# Patient Record
Sex: Female | Born: 1965 | Race: Black or African American | Hispanic: No | Marital: Married | Smoking: Never smoker
Health system: Southern US, Community
[De-identification: ages and names within clinical notes are randomized; demographics above are authoritative.]

## PROBLEM LIST (undated history)

## (undated) DIAGNOSIS — J449 Chronic obstructive pulmonary disease, unspecified: Secondary | ICD-10-CM

## (undated) DIAGNOSIS — J45909 Unspecified asthma, uncomplicated: Secondary | ICD-10-CM

## (undated) HISTORY — DX: Unspecified asthma, uncomplicated: J45.909

## (undated) HISTORY — PX: TUBAL LIGATION: SHX77

## (undated) HISTORY — PX: ABDOMINAL HYSTERECTOMY: SHX81

---

## 1997-09-05 ENCOUNTER — Inpatient Hospital Stay (HOSPITAL_COMMUNITY): Admission: AD | Admit: 1997-09-05 | Discharge: 1997-09-07 | Payer: Self-pay | Admitting: Obstetrics and Gynecology

## 1997-10-20 ENCOUNTER — Other Ambulatory Visit: Admission: RE | Admit: 1997-10-20 | Discharge: 1997-10-20 | Payer: Self-pay | Admitting: Obstetrics and Gynecology

## 1998-10-18 ENCOUNTER — Other Ambulatory Visit: Admission: RE | Admit: 1998-10-18 | Discharge: 1998-10-18 | Payer: Self-pay | Admitting: Obstetrics and Gynecology

## 1999-08-06 ENCOUNTER — Encounter (INDEPENDENT_AMBULATORY_CARE_PROVIDER_SITE_OTHER): Payer: Self-pay | Admitting: Specialist

## 1999-08-06 ENCOUNTER — Inpatient Hospital Stay (HOSPITAL_COMMUNITY): Admission: AD | Admit: 1999-08-06 | Discharge: 1999-08-08 | Payer: Self-pay | Admitting: Obstetrics and Gynecology

## 1999-09-08 ENCOUNTER — Other Ambulatory Visit: Admission: RE | Admit: 1999-09-08 | Discharge: 1999-09-08 | Payer: Self-pay | Admitting: Obstetrics and Gynecology

## 1999-09-16 ENCOUNTER — Ambulatory Visit (HOSPITAL_COMMUNITY): Admission: RE | Admit: 1999-09-16 | Discharge: 1999-09-16 | Payer: Self-pay | Admitting: Obstetrics and Gynecology

## 2000-11-06 ENCOUNTER — Other Ambulatory Visit: Admission: RE | Admit: 2000-11-06 | Discharge: 2000-11-06 | Payer: Self-pay | Admitting: Obstetrics and Gynecology

## 2001-12-31 ENCOUNTER — Other Ambulatory Visit: Admission: RE | Admit: 2001-12-31 | Discharge: 2001-12-31 | Payer: Self-pay | Admitting: Obstetrics and Gynecology

## 2002-02-14 ENCOUNTER — Ambulatory Visit (HOSPITAL_COMMUNITY): Admission: RE | Admit: 2002-02-14 | Discharge: 2002-02-14 | Payer: Self-pay | Admitting: Obstetrics and Gynecology

## 2002-02-14 ENCOUNTER — Encounter (INDEPENDENT_AMBULATORY_CARE_PROVIDER_SITE_OTHER): Payer: Self-pay | Admitting: *Deleted

## 2003-04-16 ENCOUNTER — Other Ambulatory Visit: Admission: RE | Admit: 2003-04-16 | Discharge: 2003-04-16 | Payer: Self-pay | Admitting: Obstetrics and Gynecology

## 2007-02-11 ENCOUNTER — Encounter (INDEPENDENT_AMBULATORY_CARE_PROVIDER_SITE_OTHER): Payer: Self-pay | Admitting: Obstetrics and Gynecology

## 2007-02-11 ENCOUNTER — Ambulatory Visit (HOSPITAL_COMMUNITY): Admission: RE | Admit: 2007-02-11 | Discharge: 2007-02-11 | Payer: Self-pay | Admitting: Obstetrics and Gynecology

## 2007-05-09 HISTORY — PX: DILATION AND CURETTAGE OF UTERUS: SHX78

## 2007-08-28 ENCOUNTER — Ambulatory Visit: Payer: Self-pay | Admitting: Family Medicine

## 2010-06-30 ENCOUNTER — Ambulatory Visit (HOSPITAL_COMMUNITY): Admission: RE | Admit: 2010-06-30 | Payer: 59 | Source: Ambulatory Visit | Admitting: Obstetrics and Gynecology

## 2010-06-30 ENCOUNTER — Ambulatory Visit: Admit: 2010-06-30 | Payer: Self-pay | Admitting: Obstetrics and Gynecology

## 2010-09-20 NOTE — Op Note (Signed)
NAME:  Jocelyn Myers, Jocelyn Myers                 ACCOUNT NO.:  [id number]   MEDICAL RECORD NO.:  [id number]          PATIENT TYPE:  AMB   LOCATION:  SDC                           FACILITY:  [facility]   PHYSICIAN:  Maxie Better, M.D.DATE OF BIRTH:  1965/09/21   DATE OF PROCEDURE:  02/11/2007  DATE OF DISCHARGE:                               OPERATIVE REPORT   PREOPERATIVE DIAGNOSIS:  Menorrhagia, endometrial mass.   PROCEDURES:  Diagnostic hysteroscopy, resection of endometrial polyps,  dilation and curettage.   POSTOPERATIVE DIAGNOSIS:  Menorrhagia, endometrial polyps.   ANESTHESIA:  General, paracervical block.   SURGEON:  Maxie Better, M.D.   PROCEDURE:  Under adequate general anesthesia, the patient is placed in  the dorsal lithotomy position.  She was sterilely prepped and draped in  usual fashion.  The bladder was catheterized for small amount of urine.  Examination under anesthesia revealed anteverted irregular uterus about  7 to 8-week size.  No adnexal masses could be appreciated.  A bivalve  speculum placed in the vagina.  1% Nesacaine was injected at 3 and 9  o'clock in a total of 10 mL.  The anterior lip of the cervix was grasped  with single-tooth tenaculum.  The cervix was then serially dilated to  #25 [facility] dilator.  A sorbitol primed diagnostic hysteroscope was  introduced into the uterine cavity and cavity was inspected.  There was  polypoid lesion off of the right posterior lateral aspect of the uterus  well as thickening of the endometrium in the lower uterine segment.  The  left tubal ostia could be seen.  The right was obscured by the  endometrial polyp.  The endocervical canal had no lesions.  The  diagnostic scope was removed.  The cervix was then further dilated up to  #29 [facility] dilator.  A resectoscope with a double loop was then  introduced into the uterus without incident.  The polypoid lesions were  all resected.  The cavity was then subsequently curetted.   The  resectoscope was reinserted and this was continued until the endometrial  polypoid lesions were removed and the both tubal ostia could be seen  well at which time the resectoscope was removed.  The cavity was once  again curetted.  The procedure was felt to have been complete at which  time all instruments were then removed from the vagina.  Specimen  labeled endometrial polyps, endometrial curetting was sent to pathology.  Estimated blood loss was minimal.  Complication was none.  The patient  tolerated the procedure well and was transferred to recovery in stable  condition.      Maxie Better, M.D.  Electronically Signed     [place]/MEDQ  D:  02/11/2007  T:  02/11/2007  Job:  [id number]

## 2010-09-23 ENCOUNTER — Ambulatory Visit (HOSPITAL_COMMUNITY): Admission: RE | Admit: 2010-09-23 | Payer: Self-pay | Source: Ambulatory Visit | Admitting: Obstetrics and Gynecology

## 2010-09-23 NOTE — Op Note (Signed)
NAME:  Jocelyn Myers, Jocelyn Myers                           ACCOUNT NO.:  [id number]   MEDICAL RECORD NO.:  [id number]                   PATIENT TYPE:  AMB   LOCATION:  SDC                                  [facility]ACILITY:  [facility]   PHYSICIAN:  Maxie Better, MD              DATE OF BIRTH:  [birth year]   DATE OF PROCEDURE:  02/14/2002  DATE OF DISCHARGE:                                 OPERATIVE REPORT   PREOPERATIVE DIAGNOSES:  1. Dysfunctional uterine bleeding.  2. Uterine fibroids.   PROCEDURES:  1. Diagnostic hysteroscopy.  2. Dilatation and curettage.   POSTOPERATIVE DIAGNOSIS:  Dysfunctional uterine bleeding.   ANESTHESIA:  General.   SURGEON:  Maxie Better, MD   INDICATIONS FOR PROCEDURE:  This is a 45 year old para 3 female with history  of tubal ligation who has had menometrorrhagia despite birth control pills  and who now presents for surgical evaluation.  The patient had had an  ultrasound that showed two fibroids abutting the endometrial stripe.  There  were several options reviewed with the patient.  The patient decided to have  a hysteroscopic evaluation.  The risks and benefits of procedure have been  explained to the patient.  Consent was signed.  The patient was transferred  to the operating room.   PROCEDURE:  Under adequate general anesthesia, the patient was placed in the  dorsal lithotomy position.  She was sterilely prepped and draped in the  usual fashion.  The bladder was catheterized of a moderate amount of urine.  Examination under anesthesia revealed an anteverted, slightly enlarged  uterus.  No adnexal masses could be appreciated.  Bivalve speculum was  placed in the vagina.  Single-tooth tenaculum was placed on the anterior lip  of the cervix.  The cervix was parous.  No polypoid lesions noted.  The  cervix was then dilated up to 25 [employer] dilator and a diagnostic hysteroscope  was introduced into the uterine cavity.  Both tubal ostia could be seen.  The  anterior wall of the cavity was inspected.  No definite submucosal  fibroid.  On the posterior aspect of the uterine cavity, there is a question  of a possible slightly raised area on the left aspect of the uterus.  The  decision was then made to remove the diagnostic hysteroscope, further dilate  up the cervix to the 31 Pratt dilator, and a resectoscope with a double loop  was then placed.  Using the double loop, the posterior area was inspected  and small areas that were suggestive of fibroids were resected.  However,  the tissue did not suggest that of fibroid but probably myometrium.  No  other findings could be seen.  The endocervical canal was inspected.  No  polypoid lesions noted.  The resectoscope was then removed.  The cavity was  then curetted for scant amount of tissue.  All instruments were then removed  from the  vagina.  Specimen labeled endometrial curetting, question fibroid  was sent to pathology.  Estimated blood loss was minimal.  Fluid deficit was  just a cc.  Complications were none.  The patient tolerated the procedure  well, was transferred to the recovery room in stable condition.                                              Maxie Better, MD   [place]/MEDQ  D:  02/14/2002  T:  02/14/2002  Job:  [id number]

## 2010-09-23 NOTE — Op Note (Signed)
[facility]  Patient:    Jocelyn Myers, Jocelyn Myers                        MRN: [id number] Proc. Date: 09/16/99 Adm. Date:  [id number] Disc. Date: [id number] Attending:  Maxie Better                           Operative Report  PREOPERATIVE DIAGNOSIS:       Desires sterilization.  POSTOPERATIVE DIAGNOSIS:      Desires sterilization.  OPERATION:                    Laparoscopic tubal ligation with Filshie clips.  SURGEON:                      Sheronette A. Cherly Hensen, M.D.  ASSISTANT:  ANESTHESIA:                   General anesthesia.  ESTIMATED BLOOD LOSS:  INDICATIONS:                  This is a 45 year old, gravida 3, para 3, female,  status post vaginal delivery about six weeks ago who now desires permanent sterilization.  The risks and benefits of the procedure have been explained. Alternative birth control methods have been offered and the patient declined. Consent was signed and the patient was transferred to the operating room.  DESCRIPTION OF PROCEDURE:     Under adequate general anesthesia, the patient was placed in the dorsal lithotomy position.  Examination under anesthesia revealed  anteverted uterus, normal size, and no adnexal masses palpable.  The patient was sterilely prepped and draped in the usual fashion.  The bladder was catheterized with a small amount of urine.  Bivalve speculum was placed in the vagina. Single tooth tenaculum was placed on the anterior lip of the cervix.  A Kahn cannula was introduced into the cervical os and attached to the tenaculum for manipulation f the uterus.  The bivalve speculum was removed.  Attention was then turned to the abdomen where a small infraumbilical incision was made.  The Veress needle was introduced.  Opening pressure of 6 was noted.  About 3.5 liters of CO2 was insufflated.  Veress needle was removed.  A 10 mm trocar with sleeve was introduced into the abdominal cavity without  difficulty.  A lighted video laparoscope was introduced through that port and the patient was placed in deep Trendelenburg and the abdomen inspected.  The subphrenic area was notable for a small amount of adhesions.  The liver edge otherwise appears normal.  The uterus appeared normal. Both tubes and ovaries appeared normal.  Small periovarian adhesions were noted on the right and a little bit on the left.  Initial small incision was made in the  suprapubic area, however, the decision was made to forego that site and proceed  through the infraumbilical port.  Through that the Filshie applicator was placed and the Filshie clips were placed approximately 2 cm from the cornual regions bilaterally.  Placement was reconfirmed by looking at the application itself on the tube and was noted to be appropriate.  At that point, all instruments were then  removed from the abdomen.  The abdomen was deflated.  Using Allis clamps, the fascia was identified and closed with a 0 Vicryl figure-of-eight suture.  The skin was injected with 0.25% Marcaine and approximated  using 4-0 Vicryl sutures. The instruments in the vagina were all removed.  The specimen was none.  Estimated blood loss was minimal.  Complications were none.  The patient tolerated the procedure well and was transferred to the recovery room in stable condition. DD:  09/16/99 TD:  09/19/99 Job: [id number]

## 2011-02-16 LAB — CBC
HCT: 35 — ABNORMAL LOW
MCHC: 32.8
MCV: 83.8
Platelets: 170

## 2011-02-21 NOTE — Patient Instructions (Addendum)
   Your procedure is scheduled on: Monday October 29th  Enter through the Main Entrance of [facility] at: 12 noon Pick up the phone at the desk and dial [phone] and inform us of your arrival.  Please call this number if you have any problems the morning of surgery: [phone]  Remember: Do not eat food after midnight:Sunday Do not drink clear liquids after:9:30am Monday Take these medicines the morning of surgery with a SIP OF WATER:none  Do not wear jewelry, make-up, or FINGER nail polish Do not wear lotions, powders, or perfumes. Do not shave 48 hours prior to surgery. Do not bring valuables to the hospital.  Leave suitcase in the car. After Surgery it may be brought to your room. For patients being admitted to the hospital, checkout time is 11:00am the day of discharge.  Patients discharged on the day of surgery will not be allowed to drive home.     Remember to use your hibiclens as instructed.Please shower with 1/2 bottle the evening before your surgery and the other 1/2 bottle the morning of surgery.

## 2011-02-23 ENCOUNTER — Other Ambulatory Visit: Payer: Self-pay | Admitting: Obstetrics and Gynecology

## 2011-02-27 ENCOUNTER — Encounter (HOSPITAL_COMMUNITY)
Admission: RE | Admit: 2011-02-27 | Discharge: 2011-02-27 | Disposition: A | Discharge status: Home / Self Care | Payer: 59 | Source: Ambulatory Visit | Attending: Obstetrics and Gynecology | Admitting: Obstetrics and Gynecology

## 2011-02-27 ENCOUNTER — Encounter (HOSPITAL_COMMUNITY): Payer: Self-pay

## 2011-02-27 LAB — CBC
Hemoglobin: 11.6 g/dL — ABNORMAL LOW (ref 12.0–15.0)
MCH: 27.3 pg (ref 26.0–34.0)
RBC: 4.25 MIL/uL (ref 3.87–5.11)
WBC: 4.6 10*3/uL (ref 4.0–10.5)

## 2011-02-27 LAB — SURGICAL PCR SCREEN
MRSA, PCR: INVALID — AB
Staphylococcus aureus: INVALID — AB

## 2011-02-27 LAB — BASIC METABOLIC PANEL
CO2: 29 mEq/L (ref 19–32)
GFR calc non Af Amer: 84 mL/min — ABNORMAL LOW (ref >90)
Glucose, Bld: 97 mg/dL (ref 70–99)
Potassium: 3.9 mEq/L (ref 3.5–5.1)
Sodium: 138 mEq/L (ref 135–145)

## 2011-03-02 LAB — MRSA CULTURE

## 2011-03-05 MED ORDER — CEFAZOLIN SODIUM-DEXTROSE 2-3 GM-% IV SOLR
2.0000 g | INTRAVENOUS | Status: DC
  Filled 2011-03-05: qty 50

## 2011-03-06 ENCOUNTER — Encounter (HOSPITAL_COMMUNITY): Payer: Self-pay | Admitting: Anesthesiology

## 2011-03-06 ENCOUNTER — Ambulatory Visit (HOSPITAL_COMMUNITY)
Admission: RE | Admit: 2011-03-06 | Discharge: 2011-03-07 | Disposition: A | Discharge status: Home / Self Care | Payer: 59 | Source: Ambulatory Visit | Attending: Obstetrics and Gynecology | Admitting: Obstetrics and Gynecology

## 2011-03-06 ENCOUNTER — Ambulatory Visit (HOSPITAL_COMMUNITY): Payer: 59 | Admitting: Anesthesiology

## 2011-03-06 ENCOUNTER — Encounter (HOSPITAL_COMMUNITY)
Admission: RE | Disposition: A | Discharge status: Home / Self Care | Payer: Self-pay | Source: Ambulatory Visit | Attending: Obstetrics and Gynecology

## 2011-03-06 DIAGNOSIS — D251 Intramural leiomyoma of uterus: Secondary | ICD-10-CM | POA: Insufficient documentation

## 2011-03-06 DIAGNOSIS — D252 Subserosal leiomyoma of uterus: Secondary | ICD-10-CM | POA: Insufficient documentation

## 2011-03-06 DIAGNOSIS — N838 Other noninflammatory disorders of ovary, fallopian tube and broad ligament: Secondary | ICD-10-CM | POA: Insufficient documentation

## 2011-03-06 DIAGNOSIS — Z01818 Encounter for other preprocedural examination: Secondary | ICD-10-CM | POA: Insufficient documentation

## 2011-03-06 DIAGNOSIS — N8000 Endometriosis of the uterus, unspecified: Secondary | ICD-10-CM | POA: Insufficient documentation

## 2011-03-06 DIAGNOSIS — Z90710 Acquired absence of both cervix and uterus: Secondary | ICD-10-CM | POA: Diagnosis not present

## 2011-03-06 DIAGNOSIS — N920 Excessive and frequent menstruation with regular cycle: Secondary | ICD-10-CM | POA: Insufficient documentation

## 2011-03-06 DIAGNOSIS — IMO0002 Reserved for concepts with insufficient information to code with codable children: Secondary | ICD-10-CM | POA: Insufficient documentation

## 2011-03-06 DIAGNOSIS — Z01812 Encounter for preprocedural laboratory examination: Secondary | ICD-10-CM | POA: Insufficient documentation

## 2011-03-06 SURGERY — ROBOTIC ASSISTED TOTAL HYSTERECTOMY
Anesthesia: General | Wound class: Clean Contaminated

## 2011-03-06 MED ORDER — NALOXONE HCL 0.4 MG/ML IJ SOLN: 0.4000 mg | INTRAMUSCULAR | Status: DC | PRN

## 2011-03-06 MED ORDER — FENTANYL CITRATE 0.05 MG/ML IJ SOLN
INTRAMUSCULAR | Status: AC
  Filled 2011-03-06: qty 5

## 2011-03-06 MED ORDER — LIDOCAINE HCL (CARDIAC) 20 MG/ML IV SOLN
INTRAVENOUS | Status: DC | PRN
  Administered 2011-03-06: 60 mg via INTRAVENOUS

## 2011-03-06 MED ORDER — NEOSTIGMINE METHYLSULFATE 1 MG/ML IJ SOLN
INTRAMUSCULAR | Status: DC | PRN
  Administered 2011-03-06: 4 mg via INTRAVENOUS

## 2011-03-06 MED ORDER — ROCURONIUM BROMIDE 50 MG/5ML IV SOLN
INTRAVENOUS | Status: AC
  Filled 2011-03-06: qty 1

## 2011-03-06 MED ORDER — MENTHOL 3 MG MT LOZG: 1.0000 | LOZENGE | OROMUCOSAL | Status: DC | PRN

## 2011-03-06 MED ORDER — MEPERIDINE HCL 25 MG/ML IJ SOLN: 6.2500 mg | INTRAMUSCULAR | Status: DC | PRN

## 2011-03-06 MED ORDER — EPHEDRINE SULFATE 50 MG/ML IJ SOLN
INTRAMUSCULAR | Status: DC | PRN
  Administered 2011-03-06: 20 mg via INTRAVENOUS

## 2011-03-06 MED ORDER — ZOLPIDEM TARTRATE 5 MG PO TABS: 5.0000 mg | ORAL_TABLET | Freq: Every evening | ORAL | Status: DC | PRN

## 2011-03-06 MED ORDER — DEXTROSE IN LACTATED RINGERS 5 % IV SOLN
INTRAVENOUS | Status: DC
  Administered 2011-03-06 – 2011-03-07 (×2): via INTRAVENOUS

## 2011-03-06 MED ORDER — BISACODYL 10 MG RE SUPP: 10.0000 mg | Freq: Every day | RECTAL | Status: DC | PRN

## 2011-03-06 MED ORDER — SIMETHICONE 80 MG PO CHEW: 80.0000 mg | CHEWABLE_TABLET | Freq: Four times a day (QID) | ORAL | Status: DC | PRN

## 2011-03-06 MED ORDER — PANTOPRAZOLE SODIUM 40 MG PO TBEC
40.0000 mg | DELAYED_RELEASE_TABLET | Freq: Every day | ORAL | Status: DC
  Filled 2011-03-06 (×3): qty 1

## 2011-03-06 MED ORDER — HYDROMORPHONE HCL 1 MG/ML IJ SOLN
INTRAMUSCULAR | Status: DC | PRN
  Administered 2011-03-06 (×2): 0.5 mg via INTRAVENOUS

## 2011-03-06 MED ORDER — ONDANSETRON HCL 4 MG/2ML IJ SOLN
INTRAMUSCULAR | Status: DC | PRN
  Administered 2011-03-06: 4 mg via INTRAVENOUS

## 2011-03-06 MED ORDER — GLYCOPYRROLATE 0.2 MG/ML IJ SOLN
INTRAMUSCULAR | Status: AC
  Filled 2011-03-06: qty 1

## 2011-03-06 MED ORDER — METOCLOPRAMIDE HCL 5 MG/ML IJ SOLN: 10.0000 mg | Freq: Once | INTRAMUSCULAR | Status: DC | PRN

## 2011-03-06 MED ORDER — PHENYLEPHRINE HCL 10 MG/ML IJ SOLN
INTRAMUSCULAR | Status: DC | PRN
  Administered 2011-03-06: 200 ug via INTRAVENOUS

## 2011-03-06 MED ORDER — FENTANYL CITRATE 0.05 MG/ML IJ SOLN
INTRAMUSCULAR | Status: DC | PRN
  Administered 2011-03-06: 100 ug via INTRAVENOUS
  Administered 2011-03-06: 150 ug via INTRAVENOUS

## 2011-03-06 MED ORDER — PHENYLEPHRINE 40 MCG/ML (10ML) SYRINGE FOR IV PUSH (FOR BLOOD PRESSURE SUPPORT)
PREFILLED_SYRINGE | INTRAVENOUS | Status: AC
  Filled 2011-03-06: qty 5

## 2011-03-06 MED ORDER — ACETAMINOPHEN 10 MG/ML IV SOLN
1000.0000 mg | Freq: Once | INTRAVENOUS | Status: DC
  Filled 2011-03-06: qty 100

## 2011-03-06 MED ORDER — ONDANSETRON HCL 4 MG PO TABS: 4.0000 mg | ORAL_TABLET | Freq: Four times a day (QID) | ORAL | Status: DC | PRN

## 2011-03-06 MED ORDER — ONDANSETRON HCL 4 MG/2ML IJ SOLN
INTRAMUSCULAR | Status: AC
  Filled 2011-03-06: qty 2

## 2011-03-06 MED ORDER — GLYCOPYRROLATE 0.2 MG/ML IJ SOLN
INTRAMUSCULAR | Status: DC | PRN
  Administered 2011-03-06: .4 mg via INTRAVENOUS

## 2011-03-06 MED ORDER — ONDANSETRON HCL 4 MG/2ML IJ SOLN: 4.0000 mg | Freq: Four times a day (QID) | INTRAMUSCULAR | Status: DC | PRN

## 2011-03-06 MED ORDER — LACTATED RINGERS IR SOLN
Status: DC | PRN
  Administered 2011-03-06: 1

## 2011-03-06 MED ORDER — HYDROMORPHONE 0.3 MG/ML IV SOLN
INTRAVENOUS | Status: AC
  Filled 2011-03-06: qty 25

## 2011-03-06 MED ORDER — ROCURONIUM BROMIDE 100 MG/10ML IV SOLN
INTRAVENOUS | Status: DC | PRN
  Administered 2011-03-06: 5 mg via INTRAVENOUS
  Administered 2011-03-06: 20 mg via INTRAVENOUS
  Administered 2011-03-06 (×2): 10 mg via INTRAVENOUS
  Administered 2011-03-06: 45 mg via INTRAVENOUS
  Administered 2011-03-06: 20 mg via INTRAVENOUS

## 2011-03-06 MED ORDER — ROCURONIUM BROMIDE 50 MG/5ML IV SOLN
INTRAVENOUS | Status: AC
  Filled 2011-03-06: qty 2

## 2011-03-06 MED ORDER — PROPOFOL 10 MG/ML IV EMUL
INTRAVENOUS | Status: DC | PRN
  Administered 2011-03-06: 20 mg via INTRAVENOUS
  Administered 2011-03-06: 130 mg via INTRAVENOUS

## 2011-03-06 MED ORDER — KETOROLAC TROMETHAMINE 30 MG/ML IJ SOLN
30.0000 mg | Freq: Four times a day (QID) | INTRAMUSCULAR | Status: DC
  Administered 2011-03-06 – 2011-03-07 (×2): 30 mg via INTRAVENOUS
  Filled 2011-03-06 (×2): qty 1

## 2011-03-06 MED ORDER — LIDOCAINE HCL (CARDIAC) 20 MG/ML IV SOLN
INTRAVENOUS | Status: AC
  Filled 2011-03-06: qty 5

## 2011-03-06 MED ORDER — KETOROLAC TROMETHAMINE 30 MG/ML IJ SOLN: 30.0000 mg | Freq: Four times a day (QID) | INTRAMUSCULAR | Status: DC

## 2011-03-06 MED ORDER — BUPIVACAINE HCL (PF) 0.25 % IJ SOLN
INTRAMUSCULAR | Status: DC | PRN
  Administered 2011-03-06: 15 mL

## 2011-03-06 MED ORDER — MIDAZOLAM HCL 2 MG/2ML IJ SOLN
INTRAMUSCULAR | Status: AC
  Filled 2011-03-06: qty 2

## 2011-03-06 MED ORDER — LACTATED RINGERS IV SOLN
INTRAVENOUS | Status: DC
  Administered 2011-03-06 (×3): via INTRAVENOUS

## 2011-03-06 MED ORDER — MIDAZOLAM HCL 5 MG/5ML IJ SOLN
INTRAMUSCULAR | Status: DC | PRN
  Administered 2011-03-06: 2 mg via INTRAVENOUS

## 2011-03-06 MED ORDER — KETOROLAC TROMETHAMINE 30 MG/ML IJ SOLN
INTRAMUSCULAR | Status: DC | PRN
  Administered 2011-03-06: 30 mg via INTRAVENOUS

## 2011-03-06 MED ORDER — KETOROLAC TROMETHAMINE 30 MG/ML IJ SOLN
INTRAMUSCULAR | Status: AC
  Filled 2011-03-06: qty 1

## 2011-03-06 MED ORDER — SCOPOLAMINE 1 MG/3DAYS TD PT72
MEDICATED_PATCH | TRANSDERMAL | Status: AC
  Administered 2011-03-06: 1.5 mg via TRANSDERMAL
  Filled 2011-03-06: qty 1

## 2011-03-06 MED ORDER — CEFAZOLIN SODIUM 1-5 GM-% IV SOLN
1.0000 g | Freq: Three times a day (TID) | INTRAVENOUS | Status: AC
  Administered 2011-03-06 – 2011-03-07 (×2): 1 g via INTRAVENOUS
  Filled 2011-03-06 (×2): qty 50

## 2011-03-06 MED ORDER — OXYCODONE-ACETAMINOPHEN 5-325 MG PO TABS: 1.0000 | ORAL_TABLET | ORAL | Status: DC | PRN

## 2011-03-06 MED ORDER — ALUM & MAG HYDROXIDE-SIMETH 200-200-20 MG/5ML PO SUSP: 30.0000 mL | ORAL | Status: DC | PRN

## 2011-03-06 MED ORDER — FENTANYL CITRATE 0.05 MG/ML IJ SOLN: 25.0000 ug | INTRAMUSCULAR | Status: DC | PRN

## 2011-03-06 MED ORDER — SCOPOLAMINE 1 MG/3DAYS TD PT72
1.0000 | MEDICATED_PATCH | Freq: Once | TRANSDERMAL | Status: DC
  Administered 2011-03-06: 1.5 mg via TRANSDERMAL

## 2011-03-06 MED ORDER — DEXAMETHASONE SODIUM PHOSPHATE 10 MG/ML IJ SOLN
INTRAMUSCULAR | Status: AC
  Filled 2011-03-06: qty 1

## 2011-03-06 MED ORDER — PROPOFOL 10 MG/ML IV EMUL
INTRAVENOUS | Status: AC
  Filled 2011-03-06: qty 20

## 2011-03-06 MED ORDER — IBUPROFEN 800 MG PO TABS: 800.0000 mg | ORAL_TABLET | Freq: Three times a day (TID) | ORAL | Status: DC | PRN

## 2011-03-06 MED ORDER — DEXAMETHASONE SODIUM PHOSPHATE 4 MG/ML IJ SOLN
INTRAMUSCULAR | Status: DC | PRN
  Administered 2011-03-06: 10 mg via INTRAVENOUS

## 2011-03-06 MED ORDER — ACETAMINOPHEN 10 MG/ML IV SOLN
INTRAVENOUS | Status: DC | PRN
  Administered 2011-03-06: 1000 mg via INTRAVENOUS

## 2011-03-06 MED ORDER — HYDROMORPHONE HCL 1 MG/ML IJ SOLN
INTRAMUSCULAR | Status: AC
  Filled 2011-03-06: qty 1

## 2011-03-06 MED ORDER — HYDROMORPHONE 0.3 MG/ML IV SOLN
INTRAVENOUS | Status: DC
  Administered 2011-03-06: 0.5 mg via INTRAVENOUS
  Administered 2011-03-06: 20:00:00 via INTRAVENOUS
  Administered 2011-03-07 (×2): 0 mg via INTRAVENOUS

## 2011-03-06 MED ORDER — CEFAZOLIN SODIUM 1-5 GM-% IV SOLN
INTRAVENOUS | Status: DC | PRN
  Administered 2011-03-06: 2 g via INTRAVENOUS

## 2011-03-06 MED ORDER — HYDROMORPHONE HCL 1 MG/ML IJ SOLN
0.2000 mg | INTRAMUSCULAR | Status: DC | PRN
Start: 2011-03-06 — End: 2011-03-07

## 2011-03-06 MED ORDER — DOCUSATE SODIUM 100 MG PO CAPS
100.0000 mg | ORAL_CAPSULE | Freq: Every day | ORAL | Status: DC
  Administered 2011-03-07: 100 mg via ORAL
  Filled 2011-03-06: qty 1

## 2011-03-06 MED ORDER — SODIUM CHLORIDE 0.9 % IJ SOLN: 9.0000 mL | INTRAMUSCULAR | Status: DC | PRN

## 2011-03-06 MED ORDER — SENNOSIDES-DOCUSATE SODIUM 8.6-50 MG PO TABS: 2.0000 | ORAL_TABLET | Freq: Every day | ORAL | Status: DC | PRN

## 2011-03-06 MED ORDER — NEOSTIGMINE METHYLSULFATE 1 MG/ML IJ SOLN
INTRAMUSCULAR | Status: AC
  Filled 2011-03-06: qty 10

## 2011-03-06 SURGICAL SUPPLY — 58 items
BAG URINE DRAINAGE (UROLOGICAL SUPPLIES) ×3 IMPLANT
BARRIER ADHS 3X4 INTERCEED (GAUZE/BANDAGES/DRESSINGS) ×3 IMPLANT
BLADE LAPAROSCOPIC MORCELL KIT (BLADE) ×3 IMPLANT
BLADELESS LONG 8MM (BLADE) ×3 IMPLANT
CABLE HIGH FREQUENCY MONO STRZ (ELECTRODE) ×3 IMPLANT
CATH FOLEY 3WAY  5CC 16FR (CATHETERS) ×1
CATH FOLEY 3WAY 5CC 16FR (CATHETERS) ×2 IMPLANT
CHLORAPREP W/TINT 26ML (MISCELLANEOUS) ×3 IMPLANT
CONT PATH 16OZ SNAP LID 3702 (MISCELLANEOUS) ×3 IMPLANT
COVER MAYO STAND STRL (DRAPES) ×3 IMPLANT
COVER TABLE BACK 60X90 (DRAPES) ×6 IMPLANT
COVER TIP SHEARS 8 DVNC (MISCELLANEOUS) ×2 IMPLANT
COVER TIP SHEARS 8MM DA VINCI (MISCELLANEOUS) ×1
DECANTER SPIKE VIAL GLASS SM (MISCELLANEOUS) ×3 IMPLANT
DERMABOND ADVANCED (GAUZE/BANDAGES/DRESSINGS) ×1
DERMABOND ADVANCED .7 DNX12 (GAUZE/BANDAGES/DRESSINGS) ×2 IMPLANT
DRAPE HUG U DISPOSABLE (DRAPE) ×3 IMPLANT
DRAPE LG THREE QUARTER DISP (DRAPES) ×6 IMPLANT
DRAPE MONITOR DA VINCI (DRAPE) ×3 IMPLANT
DRAPE WARM FLUID 44X44 (DRAPE) ×3 IMPLANT
ELECT REM PT RETURN 9FT ADLT (ELECTROSURGICAL) ×3
ELECTRODE REM PT RTRN 9FT ADLT (ELECTROSURGICAL) ×2 IMPLANT
EVACUATOR SMOKE 8.L (FILTER) ×3 IMPLANT
GAUZE VASELINE 3X9 (GAUZE/BANDAGES/DRESSINGS) IMPLANT
GLOVE BIO SURGEON STRL SZ 6.5 (GLOVE) ×6 IMPLANT
GLOVE BIOGEL PI IND STRL 7.0 (GLOVE) ×6 IMPLANT
GLOVE BIOGEL PI INDICATOR 7.0 (GLOVE) ×3
GOWN PREVENTION PLUS LG XLONG (DISPOSABLE) ×12 IMPLANT
KIT DISP ACCESSORY 4 ARM (KITS) ×3 IMPLANT
NEEDLE INSUFFLATION 14GA 120MM (NEEDLE) ×3 IMPLANT
NS IRRIG 1000ML POUR BTL (IV SOLUTION) ×9 IMPLANT
OCCLUDER COLPOPNEUMO (BALLOONS) IMPLANT
PACK LAVH (CUSTOM PROCEDURE TRAY) ×3 IMPLANT
PAD PREP 24X48 CUFFED NSTRL (MISCELLANEOUS) ×6 IMPLANT
PLUG CATH AND CAP STER (CATHETERS) ×3 IMPLANT
SCISSORS LAP 5X35 DISP (ENDOMECHANICALS) IMPLANT
SET IRRIG TUBING LAPAROSCOPIC (IRRIGATION / IRRIGATOR) ×3 IMPLANT
SOLUTION ELECTROLUBE (MISCELLANEOUS) ×3 IMPLANT
SPONGE LAP 18X18 X RAY DECT (DISPOSABLE) IMPLANT
SUT VIC AB 0 CT1 27 (SUTURE)
SUT VIC AB 0 CT1 27XBRD ANTBC (SUTURE) IMPLANT
SUT VICRYL 0 27 CT2 27 ABS (SUTURE) ×21 IMPLANT
SUT VICRYL 0 UR6 27IN ABS (SUTURE) ×3 IMPLANT
SUT VICRYL 4-0 PS2 18IN ABS (SUTURE) ×6 IMPLANT
SYR 50ML LL SCALE MARK (SYRINGE) ×3 IMPLANT
SYSTEM CONVERTIBLE TROCAR (TROCAR) IMPLANT
TIP UTERINE 5.1X6CM LAV DISP (MISCELLANEOUS) IMPLANT
TIP UTERINE 6.7X10CM GRN DISP (MISCELLANEOUS) IMPLANT
TIP UTERINE 6.7X6CM WHT DISP (MISCELLANEOUS) ×3 IMPLANT
TIP UTERINE 6.7X8CM BLUE DISP (MISCELLANEOUS) ×3 IMPLANT
TOWEL OR 17X24 6PK STRL BLUE (TOWEL DISPOSABLE) ×9 IMPLANT
TROCAR 12M 150ML BLUNT (TROCAR) IMPLANT
TROCAR DISP BLADELESS 8 DVNC (TROCAR) IMPLANT
TROCAR DISP BLADELESS 8MM (TROCAR)
TROCAR Z-THREAD 12X150 (TROCAR) ×3 IMPLANT
TROCAR Z-THREAD BLADED 12X100M (TROCAR) ×3 IMPLANT
TUBING FILTER THERMOFLATOR (ELECTROSURGICAL) ×3 IMPLANT
WARMER LAPAROSCOPE (MISCELLANEOUS) ×3 IMPLANT

## 2011-03-06 NOTE — Transfer of Care (Signed)
Immediate Anesthesia Transfer of Care Note  Patient: Jocelyn Myers  Procedure(s) Performed:  ROBOTIC ASSISTED TOTAL HYSTERECTOMY - Robotic Assisted Total Hysterectomy with Bilateral Salpingectomies; BILATERAL SALPINGECTOMY  Patient Location: PACU  Anesthesia Type: General  Level of Consciousness: awake  Airway & Oxygen Therapy: Patient Spontanous Breathing and Patient connected to nasal cannula oxygen  Post-op Assessment: Report given to PACU RN, Post -op Vital signs reviewed and stable and Patient moving all extremities  Post vital signs: Reviewed and stable  Complications: No apparent anesthesia complications

## 2011-03-06 NOTE — Anesthesia Postprocedure Evaluation (Signed)
Anesthesia Post Note  Patient: Jocelyn Myers  Procedure(s) Performed:  ROBOTIC ASSISTED TOTAL HYSTERECTOMY - Robotic Assisted Total Hysterectomy with Bilateral Salpingectomies; BILATERAL SALPINGECTOMY  Anesthesia type: General  Patient location: PACU  Post pain: Pain level controlled  Post assessment: Post-op Vital signs reviewed  Last Vitals:  Filed Vitals:   03/06/11 1900  BP: 110/59  Pulse: 94  Temp: 37.3 C  Resp: 17    Post vital signs: Reviewed  Level of consciousness: sedated  Complications: No apparent anesthesia complicationsfj

## 2011-03-06 NOTE — Anesthesia Preprocedure Evaluation (Addendum)
Anesthesia Evaluation  Patient identified by MRN, date of birth, ID band Patient awake  General Assessment Comment  Reviewed: Allergy & Precautions, H&P , NPO status , Patient's Chart, lab work & pertinent test results  Airway Mallampati: III TM Distance: >3 FB Neck ROM: Full    Dental No notable dental hx. (+) Teeth Intact   Pulmonary  clear to auscultation  Pulmonary exam normal       Cardiovascular Regular Normal    Neuro/Psych Negative Neurological ROS  Negative Psych ROS   GI/Hepatic negative GI ROS Neg liver ROS    Endo/Other  Negative Endocrine ROS  Renal/GU negative Renal ROS  Genitourinary negative   Musculoskeletal negative musculoskeletal ROS (+)   Abdominal Normal abdominal exam  (+)   Peds  Hematology negative hematology ROS (+)   Anesthesia Other Findings   Reproductive/Obstetrics negative OB ROS                           Anesthesia Physical Anesthesia Plan  ASA: II  Anesthesia Plan: General   Post-op Pain Management:    Induction: Intravenous  Airway Management Planned: Oral ETT  Additional Equipment:   Intra-op Plan:   Post-operative Plan: Extubation in OR  Informed Consent: I have reviewed the patients History and Physical, chart, labs and discussed the procedure including the risks, benefits and alternatives for the proposed anesthesia with the patient or authorized representative who has indicated his/her understanding and acceptance.   Dental advisory given  Plan Discussed with: CRNA, Anesthesiologist and Surgeon  Anesthesia Plan Comments:         Anesthesia Quick Evaluation

## 2011-03-06 NOTE — Anesthesia Procedure Notes (Signed)
Procedure Name: Intubation Date/Time: 03/06/2011 1:14 PM Performed by: Isabella Bowens Pre-anesthesia Checklist: Patient identified, Emergency Drugs available, Suction available, Patient being monitored and Timeout performed Patient Re-evaluated:Patient Re-evaluated prior to inductionOxygen Delivery Method: Circle System Utilized Preoxygenation: Pre-oxygenation with 100% oxygen Intubation Type: IV induction Ventilation: Mask ventilation without difficulty Laryngoscope Size: Mac and 3 Grade View: Grade III Tube type: Oral Tube size: 7.0 mm Number of attempts: 1 Airway Equipment and Method: stylet Placement Confirmation: ETT inserted through vocal cords under direct vision,  positive ETCO2 and breath sounds checked- equal and bilateral Secured at: 22 cm Tube secured with: Tape Dental Injury: Teeth and Oropharynx as per pre-operative assessment

## 2011-03-06 NOTE — Brief Op Note (Signed)
03/06/2011  6:06 PM  PATIENT:  Jocelyn Myers  45 y.o. female  PRE-OPERATIVE DIAGNOSIS:  Dysmenorrhea;Large Uterine Fibroid; Menorrhagia, Dyspareunia  POST-OPERATIVE DIAGNOSIS:  Dysmenorrhea;Large Uterine Fibroid; Menorrhagia, Dyspareunia  PROCEDURE:  Procedure(s): DAVINCI ROBOTIC ASSISTED TOTAL HYSTERECTOMY BILATERAL SALPINGECTOMY, MYOMECTOMY  SURGEON:  Surgeon(s): Hadasa Gasner Cathie Beams, MD Lenoard Aden, MD  PHYSICIAN ASSISTANT:   ASSISTANTS: Olivia Mackie, MD  ANESTHESIA:   general  FINDINGS: PEDUNCULATED 14 CM RIGHT FUNDAL FIBROID ALSO PARASITIC TO RIGHT OVARY,  NL RIGHT OVARY, LEFT OVARY WITH CYST, 8 WK SIZE UTERUS, TUBES WITH FILSHIE CLIPS BILATERALLY, LIVER EDGE NL, SUBDIAPHRAGMATIC ADHESIONS, POST CUL DE SAC ADHESIONS  EBL:  Total I/O In: 2000 [I.V.:2000] Out: 300 [Urine:150; Blood:150]  BLOOD ADMINISTERED:none  DRAINS: none   LOCAL MEDICATIONS USED:  MARCAINE 10 CC  SPECIMEN:  Source of Specimen:  uterus with cervix, fallopian tubes, morcellated fibroid   DISPOSITION OF SPECIMEN:  PATHOLOGY  COUNTS:  YES  TOURNIQUET:  * No tourniquets in log *  DICTATION: .Other Dictation: Dictation Number [id number]  PLAN OF CARE: Admit for overnight observation  PATIENT DISPOSITION:  PACU - hemodynamically stable.   Delay start of Pharmacological VTE agent (>24hrs) due to surgical blood loss or risk of bleeding:  no

## 2011-03-07 ENCOUNTER — Other Ambulatory Visit: Payer: Self-pay | Admitting: Obstetrics and Gynecology

## 2011-03-07 LAB — CBC
HCT: 33.5 % — ABNORMAL LOW (ref 36.0–46.0)
Platelets: 148 10*3/uL — ABNORMAL LOW (ref 150–400)
RDW: 13.5 % (ref 11.5–15.5)
WBC: 13 10*3/uL — ABNORMAL HIGH (ref 4.0–10.5)

## 2011-03-07 LAB — BASIC METABOLIC PANEL
BUN: 8 mg/dL (ref 6–23)
Chloride: 105 mEq/L (ref 96–112)
GFR calc Af Amer: >90 mL/min (ref >90)
Potassium: 4.9 mEq/L (ref 3.5–5.1)

## 2011-03-07 MED ORDER — SIMETHICONE 80 MG PO CHEW: 80.0000 mg | CHEWABLE_TABLET | Freq: Four times a day (QID) | ORAL | Status: AC | PRN

## 2011-03-07 MED ORDER — DSS 100 MG PO CAPS: 100.0000 mg | ORAL_CAPSULE | Freq: Every day | ORAL | Status: AC

## 2011-03-07 MED ORDER — IBUPROFEN 800 MG PO TABS: 800.0000 mg | ORAL_TABLET | Freq: Three times a day (TID) | ORAL | Status: AC | PRN

## 2011-03-07 MED ORDER — OXYCODONE-ACETAMINOPHEN 5-325 MG PO TABS: 1.0000 | ORAL_TABLET | ORAL | Status: AC | PRN

## 2011-03-07 NOTE — Progress Notes (Signed)
Subjective: Patient reports tolerating PO, + flatus and no problems voiding.    Objective: I have reviewed patient's vital signs.  vital signs, intake and output and labs. Filed Vitals:   03/07/11 0606  BP: 96/59  Pulse: 98  Temp: 98.9 F (37.2 C)  Resp: 18   I/O last 3 completed shifts: In: 2600 [I.V.:2600] Out: 1275 [Urine:1125; Blood:150]    Lab Results  Component Value Date   WBC 13.0* 03/07/2011   HGB 10.5* 03/07/2011   HCT 33.5* 03/07/2011   MCV 87.2 03/07/2011   PLT 148* 03/07/2011   Lab Results  Component Value Date   CREATININE 0.59 03/07/2011    EXAM General: alert, cooperative and no distress Resp: clear to auscultation bilaterally Cardio: regular rate and rhythm, S1, S2 normal, no murmur, click, rub or gallop GI: soft, non-tender; bowel sounds normal; no masses,  no organomegaly and incision: clean, dry and intact Extremities: no edema, redness or tenderness in the calves or thighs Vaginal Bleeding: none  Assessment: s/p Procedure(s): Da Vinci ROBOTIC ASSISTED TOTAL HYSTERECTOMY BILATERAL SALPINGECTOMY: stable and tolerating diet  Plan: Discharge home f/u 2 wk. d/c instructions reviewed. Script: tylox, Motrin  LOS: 1 day    Delainey Winstanley A, MD 03/07/2011 9:26 AM    03/07/2011, 9:26 AM

## 2011-03-07 NOTE — Anesthesia Postprocedure Evaluation (Signed)
  Anesthesia Post-op Note  Patient: Jocelyn Myers  Procedure(s) Performed:  ROBOTIC ASSISTED TOTAL HYSTERECTOMY - Robotic Assisted Total Hysterectomy with Bilateral Salpingectomies; BILATERAL SALPINGECTOMY  Patient Location: [facility] Unit  Anesthesia Type: General  Level of Consciousness: awake  Airway and Oxygen Therapy: Patient Spontanous Breathing  Post-op Pain: mild  Post-op Assessment: Post-op Vital signs reviewed  Post-op Vital Signs: Reviewed and stable  Complications: No apparent anesthesia complications

## 2011-03-07 NOTE — Addendum Note (Signed)
Addendum  created 03/07/11 0919 by Cephus Shelling   Modules edited:Notes Section

## 2011-03-07 NOTE — Discharge Summary (Signed)
Physician Discharge Summary  Patient ID: Jocelyn Myers MRN: [id number] DOB/AGE: 05-18-1965 45 y.o.  Admit date: 03/06/2011 Discharge date: 03/07/2011  Admission Diagnoses: menorrhagia, uterine fibroid, dyspareunia  Discharge Diagnoses: menorrhagia, uterine fibroid, dyspareunia Active Problems:  S/P hysterectomy- Davinci (10/29)  Procedure: Da Vinci robotic total hysterectomy, bilateral salpingectomy  Discharged Condition: stable  Hospital Course: Admitted to [facility] for above procedure. Pt underwent DaVinci robotic Total hysterectomy w/ morcellation of uterine fibroid, bilateral salpingectomy. Post op course uncomplicated  Consults: none  Significant Diagnostic Studies: labs: hgb 10.5, Hct 33.5, plt 148K, wbc 13K  Creatinine 0.59  Treatments: surgery: Da Vinci robotic total hysterectomy, bilateral salpingectomy  Discharge Exam: Blood pressure 96/59, pulse 98, temperature 98.9 F (37.2 C), temperature source Oral, resp. rate 18, height 5\' 1"  (1.549 m), weight 83.462 kg (184 lb), last menstrual period 02/22/2011, SpO2 97.00%. General appearance: alert, cooperative and no distress Back: no tenderness to percussion or palpation Resp: clear to auscultation bilaterally Cardio: regular rate and rhythm, S1, S2 normal, no murmur, click, rub or gallop GI: soft, non-tender; bowel sounds normal; no masses,  no organomegaly Extremities: no edema, redness or tenderness in the calves or thighs Incision/Wound:well approximated clean dry intact Pad no blood Disposition:  Home Condition: Stable  D/C Meds: Tylox 1-2 tabs po q 3-4 hrs prn, motrin 800mg  po q 6 hr prn  Follow up: Dr Cherly Hensen 2 wk     Signed: Leonell Lobdell A 03/07/2011, 9:02 AM

## 2011-03-07 NOTE — Op Note (Signed)
Jocelyn Myers, Jocelyn Myers                 ACCOUNT NO.:  [id number]  MEDICAL RECORD NO.:  [id number]  LOCATION:  9320                          FACILITY:  [facility]  PHYSICIAN:  Maxie Better, M.D.DATE OF BIRTH:  1966-03-24  DATE OF PROCEDURE:  03/06/2011 DATE OF DISCHARGE:                              OPERATIVE REPORT   PREOPERATIVE DIAGNOSES: 1. Menorrhagia. 2. Dyspareunia. 3. Uterine fibroids.  PROCEDURES: 1. da Vinci total hysterectomy. 2. Bilateral salpingectomy. 3. Myomectomy.  POSTOPERATIVE DIAGNOSES: 1. Fibroid uterus. 2. Menorrhagia. 3. Dyspareunia.  ANESTHESIA:  General.  SURGEON:  Maxie Better, MD  ASSISTANT:  Lenoard Aden, MD  PROCEDURE IN DETAIL: Under general anesthesia, the patient was placed in the dorsal lithotomy position.  Examination revealed a mobile mass extending to the umbilicus with the uterus that felt to be slightly enlarged about 8-10 week size, and a mass arising from the fundus on the right side.  The left adnexa without any lesions palpable. The liver had subdiaphragmatic thin adhesions. The patient was ChloraPrep and Betadine prepped in usual fashion.  Indwelling Foley catheter was sterilely placed.  The patient was then draped sterilely.  A weighted speculum was placed in the vagina.  Sims retractor was placed anteriorly.  The patient was notable for a large bulbous cervix.  The anterior and posterior cervix was grasped and 0 Vicryl figure-of-eight sutures placed.  The uterus was then sounded to 9 cm.  A large Koh RUMI cup was then placed with a manipulator of #8; however, posteriorly it seated on the cervix up to the cervical vaginal junction.  Anteriorly, it appeared that due to the size of the cervix, this had not reached quite at that junction.  The RUMI manipulator was removed.  A #6 manipulator was inserted, and the same effect was noted at which point, decision was then made to go back to the set up, which was the large Koh  RUMI cup and the #8 intrauterine manipulator.  Once this was seated correctly, the attention was then turned to the abdomen.  A supraumbilical incision was made after 0.25% Marcaine was injected.  An vertical incision made.  A Veress needle was introduced.  Opening pressure of 8 was noted, 3 L of CO2 was insufflated.  The Veress needle was then removed.  A 12-mm disposable trocar was introduced into the abdomen without incident.  Through that port, a lighted robotic camera was introduced.  It was then noted that a huge probably about 12-13 cm fibroid arising off the right fundal area.  The left fallopian tube had a Filshie clip.  Ovary was normal.  The right tube and ovary was seen, but limited by the appearance of the mass.  The additional port sites was placed after full insufflation was done.  Two 8 mm robotic sites were placed in the left.  A 12-mm assistant port was placed in the right lower quadrant, and 8 mm robotic port site was placed in between that site and the supraumbilical site.  Once this was done, the robot was docked to the instruments from the patient's left side.  Prograsper and PK and the monopolar scissors were then all inserted.  Once this was  established, I then went to the surgical console.  At the surgical console on the right side, particularly this fibroid was inspected.  It was noted to have a right wide vascular base as well as the parasitically attached to the right ovary at its distal end with no air delineation.  The ureter could be seen in the right peristalsing very well.  The right tube had a Filshie clip as well.  Due to the fact that the fibroid size and location, decision was then made to remove it first.  The approach started with the ovarian attachment of the fibroid.  This was partially separated and then the pedicle site was cauterized and cut with severing the fibroid from its attachment to the uterus.  This resulted in the mass extending to  the right side as the pedicle on the right was stretched.  Once this was reconfigured and the vessels that were prominent was decreased, the remaining attachment of the right ovary to this fibroid mass was then severed.  The fibroid was then placed in the left upper quadrant.  Attention was then turned back to the uterus.  The ureter was seen bilaterally.  A window in the retroperitoneal space was opened bilaterally.  Dissection was then performed.  The utero-ovarian ligaments bilaterally were clamped, cauterized, and then cut.  The round ligaments was then clamped, cauterized, and cut.  The anterior leaf of the broad ligament and the vesicouterine peritoneum was then opened transversely in the bladder.  The vesicouterine peritoneum was then further dissected off particularly of the [facility] RUMI ring.  The uterine vessels were then seen bilaterally, serially clamped, cauterized, and then ultimately cut bilaterally.  After securing one side and cauterizing but not cutting the uterine vessels. The posterior uterine adhesions were then cut.  Once the uterus was blanched and was clearly  cyanotic, the incision was then made posteriorly overlying the RUMI ring and the incision was then carried around circumferentially to the anterior with the resultant removal of the cervix from its vaginal attachment.  The uterus was small enough to be able to still be removed through the vagina.  When this was done, the vaginal insufflator was Then reinserted.  The vaginal cuff was inspected.  Small bleeders cauterized. The vaginal cuff was closed with 0 Vicryl figure-of-eight interrupted sutures.  Attention was then turned towards the fallopian tubes, which were both grasped and then serially, the mesosalpinx was then serially clamped, cauterized, and cut with removal of both fallopian tubes.  At that point, with good hemostasis noted, the large fibroid was still remaining.  Abdomen was irrigated, suctioned,  and the robot was undocked.  The right assistant port was then replaced by the [employer].  The large fibroid was then brought down into the field and it was morcellated.  The pieces were then removed.  The abdomen was then copiously irrigated.  Good hemostasis was noted.  At that point, the robotic sites were removed under direct visualization.  The supraumbilical site was then removed taking care not to bring up any underlying structures.  No fibroid pieces was left behind from the morcellation and the fascia in the supraumbilical site was closed with 0 Vicryl figure-of-eight suture.  The one in the right lower quadrant 12- mm port one cannot feel the fascial area, therefore no fascial sutures were placed.  The skin incisions was all approximated with 4-0 Vicryl subcuticular sutures. Bimanual examination revealed the vaginal cuff to be well approximated.  The procedure was then  felt to be complete.  The specimen was the morcellated pieces of the fibroid, which was previously attached to the uterus, uterus with cervix, and fallopian tubes all sent to Pathology.  Weight of the specimen was 945 pounds. Estimated blood loss was 150 mL.  Intraoperative fluid was 2 L.  Urine output 150 mL clear urine.  Sponge, needle, and instrument counts x2 was correct. Complication was none.  The patient tolerated the procedure well and was transferred to the recovery room in stable condition.     Maxie Better, M.D.     [place]/MEDQ  D:  03/06/2011  T:  03/07/2011  Job:  [id number]

## 2012-02-27 ENCOUNTER — Ambulatory Visit (INDEPENDENT_AMBULATORY_CARE_PROVIDER_SITE_OTHER): Payer: 59 | Admitting: Medical

## 2012-02-27 ENCOUNTER — Encounter: Payer: Self-pay | Admitting: Medical

## 2012-02-27 VITALS — BP 120/78 | HR 60 | Temp 98.2°F | Resp 18 | Wt 190.0 lb

## 2012-02-27 DIAGNOSIS — R062 Wheezing: Secondary | ICD-10-CM

## 2012-02-27 DIAGNOSIS — J45909 Unspecified asthma, uncomplicated: Secondary | ICD-10-CM

## 2012-02-27 DIAGNOSIS — R05 Cough: Secondary | ICD-10-CM

## 2012-02-27 MED ORDER — BENZONATATE 200 MG PO CAPS: 200.0000 mg | ORAL_CAPSULE | Freq: Three times a day (TID) | ORAL | Status: DC | PRN

## 2012-02-27 MED ORDER — MOMETASONE FUROATE 50 MCG/ACT NA SUSP: 2.0000 | Freq: Every day | NASAL | Status: DC

## 2012-02-27 MED ORDER — ALBUTEROL SULFATE HFA 108 (90 BASE) MCG/ACT IN AERS: 2.0000 | INHALATION_SPRAY | Freq: Four times a day (QID) | RESPIRATORY_TRACT | Status: DC | PRN

## 2012-02-27 NOTE — Progress Notes (Signed)
Subjective: Here for c/o chronic cough x 2 months.   Been using Mucinex, Robitussin, Delsym, but nothing works.  First thing in the morning feels wheezy and can't breath.  At night when lying down gets immediate cough.  Has some mucous coming up with cough.   She does report runny nose, sneezing, some sore throat, 1 episode of nausea and vomiting.   No sinus pressure, no fever.  No hx/o asthma.     No past medical history on file.  ROS as in HPI   Objective:   Physical Exam  Filed Vitals:   02/27/12 0908  BP: 120/78  Pulse: 60  Temp: 98.2 F (36.8 C)  Resp: 18    General appearance: alert, no distress, WD/WN HEENT: normocephalic, sclerae anicteric, TMs pearly, nares with turbinate swelling, clear discharge, no erythema, pharynx normal Oral cavity: MMM, no lesions Neck: supple, no lymphadenopathy, no thyromegaly, no masses, no JVD Heart: RRR, normal S1, S2, no murmurs Lungs: CTA bilaterally, no wheezes, rhonchi, or rales Pulses: 2+ symmetric, upper and lower extremities, normal cap refill Ext: no edema   Assessment and Plan :    Encounter Diagnoses  Name Primary?  . Chronic cough Yes  . Wheezing   . Allergic bronchitis    Symptoms suggest allergic trigger causing bronchospasm and wheezing.  Begin tessalon Perles prn cough, albuterol inhaler, nasonex for allergen trigger.  Discussed proper use of inhaler.  Follow-up in 1 wk if not improving.

## 2012-02-27 NOTE — Patient Instructions (Signed)
Begin Nasonex nasal spray sample, 1 spray per nostril twice daily.    Albuterol inhaler - use this twice daily for the next 5 days to open up the airway and reduce cough.   This can be used as much as every 6 hours, but try to just use it twice daily.   Use the Tessalon Perles, up to 3 times daily for cough.   This regimen should start to improve your symptoms.  If not improved by Friday, call back and I'll call in a steroid taper.   Bronchospasm, Adult Bronchospasm means that there is a spasm or tightening of the airways going into the lungs. Because the airways go into a spasm and get smaller it makes breathing more difficult. For reasons not completely known, workings (functions) of the airways designed to protect the lungs become over active. This causes the airways to become more sensitive to:  Infection.  Weather.  Exercise.  Irritants.  Things that cause allergic reactions or allergies (allergens). Frequent coughing or respiratory episodes should be checked for the cause. This condition may be made worse by exercise. CAUSES  Inflammation is often the cause of this condition. Allergy, viral respiratory infections, or irritants in the air often cause this problem. Allergic reactions produce immediate and delayed responses. Late reactions may produce more serious inflammation. This may lead to increased reactivity of the airways. Sometimes this is inherited. Some common triggers are:  Allergies.  Infection commonly triggers attacks. Antibiotics are not helpful for viral infections and usually do not help with attacks of bronchospasm.  Exercise (running, etc.) can trigger an attack. Proper pre-exercise medications help most individuals participate in sports. Swimming is the least likely sport to cause problems.  Irritants (for example, pollution, cigarette smoke, strong odors, aerosol sprays, paint fumes, etc.) may trigger attacks. You cannot smoke and do not allow smoking in your  home. This is absolutely necessary. Show this instruction to mates, relatives and significant others that may not agree with you.  Weather changes may cause lung problems but moving around trying to find an ideal climate does not seem to be overly helpful. Winds increase molds and pollens in the air. Rain refreshes the air by washing irritants out. Cold air may cause irritation.  Emotional problems do not cause lung problems but can trigger attacks. SYMPTOMS  Wheezing is the most common symptom. Frequent coughing (with or without exercise and or crying) and repeated respiratory infections are all early warning signs of bronchospasm. Chest tightness and shortness of breath are other symptoms. DIAGNOSIS  Early hidden bronchospasm may go for long periods of time without being detected. This is especially true if wheezing cannot be detected by your caregiver. Lung (pulmonary) function studies may help with diagnosis in these cases. HOME CARE INSTRUCTIONS   It is necessary to remain calm during an attack. Try to relax and breathe more slowly. During this time medications may be given. If any breathing problems seem to be getting worse and are unresponsive to treatment seek immediate medical care.  If you have severe breathing difficulty or have had a life threatening attack it is probably a good idea for you to learn how to give adrenaline (epi-pen) or use an anaphylaxis kit. Your caregiver can help you with this. These are the same kits carried by people who have severe allergic reactions. This is especially important if you do not have readily accessible medical care.  With any severe breathing problems where epinephrine (adrenaline) has been given at home call  [phone] immediately as the delayed reaction may be even more severe. SEEK MEDICAL CARE IF:   There is wheezing and shortness of breath, even if medications are given to prevent attacks.  An oral temperature above 102 F (38.9 C)  develops.  There are muscle aches, chest pain, or thickening of sputum.  The sputum changes from clear or white to yellow, green, gray, or bloody.  There are problems that may be related to the medicine you are given, such as a rash, itching, swelling, or trouble breathing. SEEK IMMEDIATE MEDICAL CARE IF:   The usual medicines do not stop your wheezing, or there is increased coughing.  You have increased difficulty breathing. MAKE SURE YOU:   Understand these instructions.  Will watch your condition.  Will get help right away if you are not doing well or get worse. Document Released: 04/27/2003 Document Revised: 07/17/2011 Document Reviewed: 12/11/2007 [facility] Patient Information 2013 [place], [place].

## 2012-03-04 ENCOUNTER — Telehealth: Payer: Self-pay | Admitting: Medical

## 2012-03-04 ENCOUNTER — Other Ambulatory Visit: Payer: Self-pay | Admitting: Medical

## 2012-03-04 MED ORDER — PREDNISONE 10 MG PO TABS: ORAL_TABLET | ORAL | Status: DC

## 2012-03-04 NOTE — Telephone Encounter (Signed)
I spoke with pt about symptoms.  Improving while using tessalon and inhaler respectively, but cough overall not much better.   So far etiology still seems to be allergen related.   Script for prednisone taper sent to pharmacy.  Call mid week if not improving.

## 2012-03-04 NOTE — Telephone Encounter (Signed)
Not any better, chest hurting from coughing so much  Rx for cough "cough drops" helping very little  Please call

## 2012-03-19 ENCOUNTER — Ambulatory Visit (INDEPENDENT_AMBULATORY_CARE_PROVIDER_SITE_OTHER): Payer: 59 | Admitting: Medical

## 2012-03-19 ENCOUNTER — Encounter: Payer: Self-pay | Admitting: Medical

## 2012-03-19 VITALS — BP 120/78 | HR 92 | Temp 98.1°F | Resp 18 | Wt 193.0 lb

## 2012-03-19 DIAGNOSIS — R0609 Other forms of dyspnea: Secondary | ICD-10-CM

## 2012-03-19 DIAGNOSIS — R062 Wheezing: Secondary | ICD-10-CM

## 2012-03-19 DIAGNOSIS — R05 Cough: Secondary | ICD-10-CM

## 2012-03-19 LAB — BASIC METABOLIC PANEL
CO2: 23 mEq/L (ref 19–32)
Calcium: 9.8 mg/dL (ref 8.4–10.5)
Chloride: 103 mEq/L (ref 96–112)
Glucose, Bld: 82 mg/dL (ref 70–99)
Potassium: 4.8 mEq/L (ref 3.5–5.3)
Sodium: 143 mEq/L (ref 135–145)

## 2012-03-19 LAB — CBC WITH DIFFERENTIAL/PLATELET
Eosinophils Absolute: 0.1 10*3/uL (ref 0.0–0.7)
Hemoglobin: 11.9 g/dL — ABNORMAL LOW (ref 12.0–15.0)
Lymphocytes Relative: 33 % (ref 12–46)
Lymphs Abs: 2 10*3/uL (ref 0.7–4.0)
Monocytes Relative: 9 % (ref 3–12)
Neutro Abs: 3.3 10*3/uL (ref 1.7–7.7)
Neutrophils Relative %: 55 % (ref 43–77)
Platelets: 151 10*3/uL (ref 150–400)
RBC: 4.47 MIL/uL (ref 3.87–5.11)
WBC: 5.9 10*3/uL (ref 4.0–10.5)

## 2012-03-19 MED ORDER — HYDROCODONE-HOMATROPINE 5-1.5 MG/5ML PO SYRP: 5.0000 mL | ORAL_SOLUTION | Freq: Three times a day (TID) | ORAL | Status: DC | PRN

## 2012-03-19 NOTE — Progress Notes (Signed)
Subjective: Here for c/o chronic cough x 2+ months.   At last visit, we used trial of Albuterol, nasonex nasal, tessalon perles.   Initially didn't see improvement.  We called out course of prednisone wich really calmed down things for a few days, but then she resumed having same symptoms.  She is still using inhaled every 6 hours, nasonex daily.  She reports ongoing cough, worse lying flat, but gets cough all throughout the day.  interferes with sleep.  Chest feels heavy.  Gets out of breath or wheezing walking form car to house or with activity in general as of late.  Denies palpitations, no syncope, no edema.  Denies GERD symptoms.  She does report some wheezing.  Denies ear pain, sore throat, nausea, fever, no recent allergy problems.  Feels like if she could get a good night's sleep without cough, may not feel as bad.  About the time she started getting cough, she had just recently started exercising.  Was walking vigorously prior to the cough starting.  No hx/o asthma, lung disease, heart disease.  No first degree relatives with heart disease.    No past medical history on file.  ROS as in HPI   Objective:   Physical Exam  Filed Vitals:   03/19/12 1338  BP: 120/78  Pulse: 92  Temp: 98.1 F (36.7 C)  Resp: 18    General appearance: alert, no distress, WD/WN HEENT: normocephalic, sclerae anicteric, TMs pearly, nares with turbinate swelling, no discharge, no erythema, pharynx with mild erythema and pnd Oral cavity: MMM, no lesions Neck: supple, no lymphadenopathy, no thyromegaly, no masses, no JVD Heart: RRR, normal S1, S2, no murmurs Lungs: decreased breath sounds, faint wheeze, no rhonchi, or rales Pulses: 2+ symmetric, upper and lower extremities, normal cap refill Ext: no edema   Adult ECG Report  Indication: DOE  Rate: 64bpm  Rhythm: normal sinus rhythm  QRS Axis: -10 degrees  PR Interval:  QRS Duration: 86 ms  QTc:  Conduction Disturbances: none  Other  Abnormalities: none  Patient's cardiac risk factors are: none.  EKG comparison: none  Narrative Interpretation: borderline left axis deviation, otherwise normal EKG    Assessment and Plan :    Encounter Diagnoses  Name Primary?  . Cough Yes  . DOE (dyspnea on exertion)   . Wheezing    Will get labs, CXR.  I still suspect asthmatic bronchitis picture vs other etiology, but will call back with results and plan.

## 2012-03-20 ENCOUNTER — Encounter: Payer: Self-pay | Admitting: Medical

## 2012-03-25 ENCOUNTER — Ambulatory Visit
Admission: RE | Admit: 2012-03-25 | Discharge: 2012-03-25 | Disposition: A | Discharge status: Home / Self Care | Payer: 59 | Source: Ambulatory Visit | Attending: Medical | Admitting: Medical

## 2012-03-25 DIAGNOSIS — R05 Cough: Secondary | ICD-10-CM

## 2012-03-25 DIAGNOSIS — R062 Wheezing: Secondary | ICD-10-CM

## 2012-03-28 ENCOUNTER — Other Ambulatory Visit: Payer: Self-pay | Admitting: Medical

## 2012-03-28 MED ORDER — BENZONATATE 200 MG PO CAPS: 200.0000 mg | ORAL_CAPSULE | Freq: Two times a day (BID) | ORAL | Status: DC | PRN

## 2012-04-01 ENCOUNTER — Ambulatory Visit (INDEPENDENT_AMBULATORY_CARE_PROVIDER_SITE_OTHER): Payer: 59 | Admitting: Internal Medicine

## 2012-04-01 DIAGNOSIS — R0602 Shortness of breath: Secondary | ICD-10-CM

## 2012-04-01 LAB — PULMONARY FUNCTION TEST

## 2012-04-01 NOTE — Progress Notes (Signed)
PFT done today. 

## 2012-04-09 ENCOUNTER — Encounter: Payer: Self-pay | Admitting: Medical

## 2012-04-12 ENCOUNTER — Telehealth: Payer: Self-pay | Admitting: Medical

## 2012-04-12 NOTE — Telephone Encounter (Signed)
PFTs show severe obstructive defect.  Not sure what the cause is since she is a nonsmoker.  Refer to pulmonology, Dr. Maple Hudson for further evaluation.  If we can't get appt within 2wk, have her come in here for additional eval in the meantime, but still set up appt.  I tried to call her Friday night but no answer.

## 2012-04-15 ENCOUNTER — Telehealth: Payer: Self-pay | Admitting: Family Medicine

## 2012-04-15 NOTE — Telephone Encounter (Signed)
PATIENT IS AWARE OF HER PFT RESULTS AND THE REFERRAL. CLS

## 2012-04-15 NOTE — Telephone Encounter (Signed)
PATIENT IS AWARE OF HER APPOINTMENT AT [facility] ON 05/23/12 @ 1015 AM WITH DR. Maple Hudson. CLS

## 2012-05-23 ENCOUNTER — Encounter: Payer: Self-pay | Admitting: Internal Medicine

## 2012-05-23 ENCOUNTER — Other Ambulatory Visit: Payer: 59

## 2012-05-23 ENCOUNTER — Ambulatory Visit (INDEPENDENT_AMBULATORY_CARE_PROVIDER_SITE_OTHER): Payer: 59 | Admitting: Internal Medicine

## 2012-05-23 VITALS — BP 110/68 | HR 93 | Ht 61.0 in | Wt 198.2 lb

## 2012-05-23 DIAGNOSIS — J45909 Unspecified asthma, uncomplicated: Secondary | ICD-10-CM

## 2012-05-23 DIAGNOSIS — J4489 Other specified chronic obstructive pulmonary disease: Secondary | ICD-10-CM

## 2012-05-23 DIAGNOSIS — Z23 Encounter for immunization: Secondary | ICD-10-CM

## 2012-05-23 DIAGNOSIS — J449 Chronic obstructive pulmonary disease, unspecified: Secondary | ICD-10-CM

## 2012-05-23 NOTE — Progress Notes (Signed)
05/23/12- 47 yoF never smoker referred courtesy of [facility], concerned about asthma with cough. She began noticing wheezing dyspnea with exertion during the summer of 2013. She was told in November of 2013 she had asthma. Cold air, strong odors  make her worse and she gets relief from her rescue inhaler. Allergy skin testing by Dr. Willa Rough did not lead to allergy vaccine. PFT showed severe obstructive disease. She has been using Dulera 200 and Qvar 80. She uses her rescue inhaler so that she can walk from the parking lot into work and uses it about 3 times per day typically. Cough has required Hycodan. Sputum is mostly clear or trace yellow. She denies past history of pneumonia or any prior lung condition or heart disease. There was a clotting disorder when she was pregnant but she denies VTE. 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 PFT- 04/04/12- FVC 2.71/84%, FEV1 1.20/48%, FEV1/FVC 0.44, no response to bronchodilator, TLC 90%, DLCO 68%. The curve looks like emphysema. Office spirometry 05/23/2012-FVC 2.60/100%, FEV1 1.2/57%, FEV1/FVC 0.48, FEF 25-75% 0.49. CXR 03/28/12 IMPRESSION:  No active lung disease. Question of small granuloma anteriorly on  the lateral view either within the lingula or right middle lobe.  Original Report Authenticated By: Dwyane Dee, M.D.   Prior to Admission medications   Medication Sig Start Date End Date Taking? Authorizing Provider  albuterol (PROVENTIL HFA;VENTOLIN HFA) 108 (90 BASE) MCG/ACT inhaler Inhale 2 puffs into the lungs every 6 (six) hours as needed for wheezing. 02/27/12  Yes Kermit Balo Tysinger, PA  mometasone (NASONEX) 50 MCG/ACT nasal spray Place 2 sprays into the nose daily. 02/27/12  Yes Jac Canavan, PA   Past Medical History  Diagnosis Date  . Asthma    Past Surgical History  Procedure Date  . Dilation and curettage of uterus 2009    polyp removed  . Tubal  ligation    Family History  Problem Relation Age of Onset  . Asthma Son    History   Social History  . Marital Status: Married    Spouse Name: Kimani Bedoya    Number of Children: 3  . Years of Education: N/A   Occupational History  . SUPERVISOR    Social History Main Topics  . Smoking status: Never Smoker   . Smokeless tobacco: Not on file  . Alcohol Use: No  . Drug Use: No  . Sexually Active:    Other Topics Concern  . Not on file   Social History Narrative  . No narrative on file   ROS-see HPI Constitutional:   No-   weight loss, night sweats, fevers, chills, fatigue, lassitude. HEENT:   No-  headaches, difficulty swallowing, tooth/dental problems, +sore throat,       No-  sneezing, itching, ear ache, nasal congestion, post nasal drip,  CV:  No-   chest pain, orthopnea, PND, swelling in lower extremities, anasarca,                                  dizziness, palpitations Resp: +  shortness of breath with exertion or at rest.              +  productive cough,  No non-productive cough,  No- coughing up of blood.              No-   change  in color of mucus.  No- wheezing.   Skin: No-   rash or lesions. GI:  No-   heartburn, indigestion, abdominal pain, nausea, vomiting, diarrhea,                 change in bowel habits, loss of appetite GU: No-   dysuria, change in color of urine, no urgency or frequency.  No- flank pain. MS:  No-   joint pain or swelling.  No- decreased range of motion.  No- back pain. Neuro-     nothing unusual Psych:  No- change in mood or affect. No depression or anxiety.  No memory loss.  OBJ- Physical Exam General- Alert, Oriented, Affect-appropriate, Distress- none acute Skin- rash-none, lesions- none, excoriation- none Lymphadenopathy- none Head- atraumatic            Eyes- Gross vision intact, PERRLA, conjunctivae and secretions clear            Ears- Hearing, canals-normal            Nose- Clear, no-Septal dev, mucus, polyps, erosion,  perforation             Throat- Mallampati II , mucosa clear , drainage- none, tonsils- atrophic, own teeth Neck- flexible , trachea midline, no stridor , thyroid nl, carotid no bruit Chest - symmetrical excursion , unlabored           Heart/CV- RRR , no murmur , no gallop  , no rub, nl s1 s2                           - JVD- none , edema- none, stasis changes- none, varices- none           Lung- clear to P&A, wheeze- none, cough- none , dullness-none, rub- none           Chest wall-  Abd- tender-no, distended-no, bowel sounds-present, HSM- no Br/ Gen/ Rectal- Not done, not indicated Extrem- cyanosis- none, clubbing, none, atrophy- none, strength- nl Neuro- grossly intact to observation

## 2012-05-23 NOTE — Patient Instructions (Addendum)
Order- office spirometry    Dx asthma              Schedule 6 MWT              Alpha 1 antitrypsin level  Flu vax  Sample Tudorza inhaler     1 puff, twice daily        Add this at same time you use your [facility]

## 2012-05-29 LAB — ALPHA-1 ANTITRYPSIN PHENOTYPE

## 2012-06-04 ENCOUNTER — Telehealth: Payer: Self-pay | Admitting: Internal Medicine

## 2012-06-04 NOTE — Telephone Encounter (Signed)
Called and spoke with pt and she is aware of lab results per CY and pt voiced her understanding and nothing further is needed.

## 2012-06-04 NOTE — Progress Notes (Signed)
Quick Note:  LMTCB ______ 

## 2012-06-06 DIAGNOSIS — J449 Chronic obstructive pulmonary disease, unspecified: Secondary | ICD-10-CM | POA: Insufficient documentation

## 2012-06-06 DIAGNOSIS — J4489 Other specified chronic obstructive pulmonary disease: Secondary | ICD-10-CM | POA: Insufficient documentation

## 2012-06-06 NOTE — Assessment & Plan Note (Addendum)
She is taking maintenance bronchodilators and recognizes some benefit from rescue inhaler, implying a reactive airways component not noted on her PFTs. The F-V loop contour favors significant emphysema. I don't know why we are seeing this pattern without more history of exposure.   Plan- screen for alpha-1 antitrypsin deficiency. 6 minute walk test, flu vaccine, try a sample of [place]

## 2012-06-24 ENCOUNTER — Ambulatory Visit: Payer: 59 | Admitting: Internal Medicine

## 2012-06-24 ENCOUNTER — Ambulatory Visit: Payer: 59

## 2012-07-10 ENCOUNTER — Encounter: Payer: Self-pay | Admitting: Internal Medicine

## 2012-07-10 ENCOUNTER — Other Ambulatory Visit: Payer: 59

## 2012-07-10 ENCOUNTER — Ambulatory Visit (INDEPENDENT_AMBULATORY_CARE_PROVIDER_SITE_OTHER): Payer: 59 | Admitting: Internal Medicine

## 2012-07-10 VITALS — BP 108/70 | HR 98 | Ht 61.0 in | Wt 193.8 lb

## 2012-07-10 DIAGNOSIS — J449 Chronic obstructive pulmonary disease, unspecified: Secondary | ICD-10-CM

## 2012-07-10 DIAGNOSIS — J45909 Unspecified asthma, uncomplicated: Secondary | ICD-10-CM

## 2012-07-10 DIAGNOSIS — R0600 Dyspnea, unspecified: Secondary | ICD-10-CM

## 2012-07-10 DIAGNOSIS — R0989 Other specified symptoms and signs involving the circulatory and respiratory systems: Secondary | ICD-10-CM

## 2012-07-10 MED ORDER — ACLIDINIUM BROMIDE 400 MCG/ACT IN AEPB: 1.0000 | INHALATION_SPRAY | Freq: Two times a day (BID) | RESPIRATORY_TRACT | Status: DC

## 2012-07-10 NOTE — Progress Notes (Signed)
05/23/12- 47 yoF never smoker referred courtesy of [facility], concerned about asthma with cough. She began noticing wheezing dyspnea with exertion during the summer of 2013. She was told in November of 2013 she had asthma. Cold air, strong odors  make her worse and she gets relief from her rescue inhaler. Allergy skin testing by Dr. Willa Rough did not lead to allergy vaccine. PFT showed severe obstructive disease. She has been using Dulera 200 and Qvar 80. She uses her rescue inhaler so that she can walk from the parking lot into work and uses it about 3 times per day typically. Cough has required Hycodan. Sputum is mostly clear or trace yellow. She denies past history of pneumonia or any prior lung condition or heart disease. There was a clotting disorder when she was pregnant but she denies VTE. 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 PFT- 04/04/12- FVC 2.71/84%, FEV1 1.20/48%, FEV1/FVC 0.44, no response to bronchodilator, TLC 90%, DLCO 68%. The curve looks like emphysema. Office spirometry 05/23/2012-FVC 2.60/100%, FEV1 1.2/57%, FEV1/FVC 0.48, FEF 25-75% 0.49. CXR 03/28/12 IMPRESSION:  No active lung disease. Question of small granuloma anteriorly on  the lateral view either within the lingula or right middle lobe.  Original Report Authenticated By: Dwyane Dee, M.D.   07/10/12- 21 yoF never smoker referred courtesy of [facility], concerned about asthma with cough FOLLOWS FOR: has noticed wheezing, SOB, and cough has returned since last visit; also review test with patient. Cold air triggers wheeze cough and nasal congestion. Continues Dulera 200 and [place]. PFT 04/04/2012 had demonstrated severe emphysema. 07/10/12- 98%, 97%, 97%, 456 m. No oxygen limitation to this exercise. a1AT phenotype 05/23/12- Normal variant M1M2, level 126   ROS-see HPI Constitutional:   No-   weight loss, night sweats, fevers,  chills, fatigue, lassitude. HEENT:   No-  headaches, difficulty swallowing, tooth/dental problems, +sore throat,       No-  sneezing, itching, ear ache, nasal congestion, post nasal drip,  CV:  No-   chest pain, orthopnea, PND, swelling in lower extremities, anasarca,                                  dizziness, palpitations Resp: +  shortness of breath with exertion or at rest.              +  productive cough,  No non-productive cough,  No- coughing up of blood.              No-   change in color of mucus.  No- wheezing.   Skin: No-   rash or lesions. GI:  No-   heartburn, indigestion, abdominal pain, nausea, vomiting,  GU: n. MS:  No-   joint pain or swelling.   Neuro-     nothing unusual Psych:  No- change in mood or affect. No depression or anxiety.  No memory loss.  OBJ- Physical Exam General- Alert, Oriented, Affect-appropriate, Distress- none acute Skin- rash-none, lesions- none, excoriation- none Lymphadenopathy- none Head- atraumatic            Eyes- Gross vision intact, PERRLA, conjunctivae and secretions clear            Ears- Hearing, canals-normal            Nose- Clear, no-Septal dev, mucus, polyps, erosion, perforation  Throat- Mallampati II , mucosa clear , drainage- none, tonsils- atrophic, own teeth Neck- flexible , trachea midline, no stridor , thyroid nl, carotid no bruit Chest - symmetrical excursion , unlabored           Heart/CV- RRR , no murmur , no gallop  , no rub, nl s1 s2                           - JVD- none , edema- none, stasis changes- none, varices- none           Lung- clear to P&A, distant, wheeze- none, cough- none , dullness-none, rub- none           Chest wall-  Abd-  Br/ Gen/ Rectal- Not done, not indicated Extrem- cyanosis- none, clubbing, none, atrophy- none, strength- nl Neuro- grossly intact to observation

## 2012-07-10 NOTE — Patient Instructions (Addendum)
Order- lab a1AT assay     Dx COPD with asthma  Order- future  At next visit- office spirometry   Script sent for Tudorza inhaler

## 2012-07-11 NOTE — Assessment & Plan Note (Addendum)
We haven't identified enough exposure to explain an emphysema pattern.there is no family history. a1AT phenotype M1M2 is a normal variant normal blood levels.

## 2012-07-11 NOTE — Progress Notes (Signed)
Documentation for 6 minute walk test 

## 2012-08-21 ENCOUNTER — Ambulatory Visit: Payer: 59 | Admitting: Internal Medicine

## 2013-04-22 ENCOUNTER — Other Ambulatory Visit: Payer: Self-pay

## 2013-04-22 ENCOUNTER — Telehealth: Payer: Self-pay | Admitting: Internal Medicine

## 2013-04-22 MED ORDER — AZITHROMYCIN 250 MG PO TABS: ORAL_TABLET | ORAL | Status: DC

## 2013-04-22 NOTE — Telephone Encounter (Signed)
Called in z-pack per CY to pt's pharmacy.  Pt aware, will call in the next couple days if this does not help.  Nothing further needed. Caulfield,Ashley L

## 2013-04-22 NOTE — Telephone Encounter (Signed)
Spoke to pt. Reports cough with production of dark yellow, chest tightness, wheezing and SOB. Onset was 2 weeks ago. Has tried Delsym and OTC nasal spray with no relief. Would like something called in if possible.  Allergies  Allergen Reactions  . Benadryl [Diphenhydramine Hcl] Hives and Shortness Of Breath    Current Outpatient Prescriptions on File Prior to Visit  Medication Sig Dispense Refill  . Aclidinium Bromide (TUDORZA PRESSAIR) 400 MCG/ACT AEPB Inhale 1 puff into the lungs daily.      . Aclidinium Bromide (TUDORZA PRESSAIR) 400 MCG/ACT AEPB Inhale 1 puff into the lungs 2 (two) times daily.  1 each  prn  . albuterol (PROVENTIL HFA;VENTOLIN HFA) 108 (90 BASE) MCG/ACT inhaler Inhale 2 puffs into the lungs every 6 (six) hours as needed for wheezing.  1 Inhaler  0  . mometasone (NASONEX) 50 MCG/ACT nasal spray Place 2 sprays into the nose daily.  17 g  0  . mometasone-formoterol (DULERA) 200-5 MCG/ACT AERO Inhale 2 puffs into the lungs 2 (two) times daily.       No current facility-administered medications on file prior to visit.    CY - please advise. Thanks.

## 2013-04-25 ENCOUNTER — Ambulatory Visit (INDEPENDENT_AMBULATORY_CARE_PROVIDER_SITE_OTHER): Payer: 59 | Admitting: Internal Medicine

## 2013-04-25 ENCOUNTER — Telehealth: Payer: Self-pay | Admitting: Internal Medicine

## 2013-04-25 ENCOUNTER — Encounter: Payer: Self-pay | Admitting: Internal Medicine

## 2013-04-25 VITALS — BP 102/72 | HR 92 | Ht 61.0 in | Wt 193.0 lb

## 2013-04-25 DIAGNOSIS — J449 Chronic obstructive pulmonary disease, unspecified: Secondary | ICD-10-CM

## 2013-04-25 DIAGNOSIS — J45909 Unspecified asthma, uncomplicated: Secondary | ICD-10-CM

## 2013-04-25 MED ORDER — MOMETASONE FURO-FORMOTEROL FUM 200-5 MCG/ACT IN AERO: INHALATION_SPRAY | RESPIRATORY_TRACT | Status: DC

## 2013-04-25 MED ORDER — MOMETASONE FUROATE 50 MCG/ACT NA SUSP: 2.0000 | Freq: Every day | NASAL | Status: DC

## 2013-04-25 MED ORDER — METHYLPREDNISOLONE ACETATE 80 MG/ML IJ SUSP
80.0000 mg | Freq: Once | INTRAMUSCULAR | Status: AC
  Administered 2013-04-25: 80 mg via INTRAMUSCULAR

## 2013-04-25 MED ORDER — ALBUTEROL SULFATE HFA 108 (90 BASE) MCG/ACT IN AERS: 2.0000 | INHALATION_SPRAY | Freq: Four times a day (QID) | RESPIRATORY_TRACT | Status: DC | PRN

## 2013-04-25 MED ORDER — LEVALBUTEROL HCL 0.63 MG/3ML IN NEBU
0.6300 mg | INHALATION_SOLUTION | Freq: Once | RESPIRATORY_TRACT | Status: AC
  Administered 2013-04-25: 0.63 mg via RESPIRATORY_TRACT

## 2013-04-25 MED ORDER — PREDNISONE 10 MG PO TABS: ORAL_TABLET | ORAL | Status: DC

## 2013-04-25 MED ORDER — ACLIDINIUM BROMIDE 400 MCG/ACT IN AEPB: 1.0000 | INHALATION_SPRAY | Freq: Two times a day (BID) | RESPIRATORY_TRACT | Status: DC

## 2013-04-25 MED ORDER — ALBUTEROL SULFATE (2.5 MG/3ML) 0.083% IN NEBU: 2.5000 mg | INHALATION_SOLUTION | Freq: Four times a day (QID) | RESPIRATORY_TRACT | Status: DC | PRN

## 2013-04-25 NOTE — Telephone Encounter (Signed)
Spoke with the pt  She states that she was unable to pick up any of her prescriptions today [facility] sent dulera, nasonex and proventil and the total of all three meds was over $400, the cheapest was nasonex which was 60$ She states that they did not provide any alternatives Please advise thanks!

## 2013-04-25 NOTE — Telephone Encounter (Signed)
Send pred taper for now  10 mg, # 20, 4 X 2 DAYS, 3 X 2 DAYS, 2 X 2 DAYS, 1 X 2 DAYS Does she have any sort of coverage that will help with any meds? If so, we can try to work with that formulary.

## 2013-04-25 NOTE — Telephone Encounter (Signed)
Called and spoke with pt and she is aware of pred taper that has been sent to her pharmacy per CY.    Pt stated that she has prescription insurance but this only covers certain medications.  Pt wanted CY to know this.  Will forward back to CY to make him aware.   thanks

## 2013-04-25 NOTE — Patient Instructions (Signed)
Neb xop 0.63  Depo 80  Scripts sent for [employer], Nasonex, Albuterol HFA  Please call as needed

## 2013-04-25 NOTE — Progress Notes (Signed)
05/23/12- 47 yoF never smoker referred courtesy of [facility], concerned about asthma with cough. She began noticing wheezing dyspnea with exertion during the summer of 2013. She was told in November of 2013 she had asthma. Cold air, strong odors  make her worse and she gets relief from her rescue inhaler. Allergy skin testing by Dr. Willa Rough did not lead to allergy vaccine. PFT showed severe obstructive disease. She has been using Dulera 200 and Qvar 80. She uses her rescue inhaler so that she can walk from the parking lot into work and uses it about 3 times per day typically. Cough has required Hycodan. Sputum is mostly clear or trace yellow. She denies past history of pneumonia or any prior lung condition or heart disease. There was a clotting disorder when she was pregnant but she denies VTE. 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 PFT- 04/04/12- FVC 2.71/84%, FEV1 1.20/48%, FEV1/FVC 0.44, no response to bronchodilator, TLC 90%, DLCO 68%. The curve looks like emphysema. Office spirometry 05/23/2012-FVC 2.60/100%, FEV1 1.2/57%, FEV1/FVC 0.48, FEF 25-75% 0.49. CXR 03/28/12 IMPRESSION:  No active lung disease. Question of small granuloma anteriorly on  the lateral view either within the lingula or right middle lobe.  Original Report Authenticated By: Dwyane Dee, M.D.   07/10/12- 15 yoF never smoker referred courtesy of [facility], concerned about asthma with cough FOLLOWS FOR: has noticed wheezing, SOB, and cough has returned since last visit; also review test with patient. Cold air triggers wheeze cough and nasal congestion. Continues Dulera 200 and [place]. PFT 04/04/2012 had demonstrated severe emphysema. 07/10/12- 98%, 97%, 97%, 456 m. No oxygen limitation to this exercise. a1AT phenotype 05/23/12- Normal variant M1M2, level 126  04/25/13- 47 yoF never smoker/ + secondhand, followed for COPD with  asthma FOLLOWS FOR: Breathing is unchanged. Still reports SOB, wheezing, coughing and chest tightness. Cough for the past month with wheeze for 2 or 3 weeks. [employer] helped. Ran out of medicines 3 weeks ago. Itching and sneezing. No fever or green. Allergy skin test positive only for house dust in the past. Office Spirometry 04/25/2013-very severe obstruction. FVC 1.40/54%, FEV1 0.64/29%, FEV1/FEC 0.46, FEF 25-75% 0.26/9%. CXR 03/19/12 IMPRESSION:  No active lung disease. Question of small granuloma anteriorly on  the lateral view either within the lingula or right middle lobe.  Original Report Authenticated By: Dwyane Dee, M.D. Office spirometry 04/25/13  ROS-see HPI Constitutional:   No-   weight loss, night sweats, fevers, chills, fatigue, lassitude. HEENT:   No-  headaches, difficulty swallowing, tooth/dental problems, +sore throat,       No-  sneezing, itching, ear ache, nasal congestion, post nasal drip,  CV:  No-   chest pain, orthopnea, PND, swelling in lower extremities, anasarca, dizziness, palpitations Resp: +  shortness of breath with exertion or at rest.              +  productive cough,  No non-productive cough,  No- coughing up of blood.              No-   change in color of mucus.  + wheezing.   Skin: No-   rash or lesions. GI:  No-   heartburn, indigestion, abdominal pain, nausea, vomiting,  GU: n. MS:  No-   joint pain or swelling.   Neuro-     nothing unusual Psych:  No- change in mood or affect. No depression or anxiety.  No memory loss.  OBJ- Physical Exam General- Alert, Oriented, Affect-appropriate, Distress- none acute Skin- rash-none, lesions- none, excoriation- none Lymphadenopathy- none Head- atraumatic            Eyes- Gross vision intact, PERRLA, conjunctivae and secretions clear            Ears- Hearing, canals-normal            Nose- Clear, no-Septal dev, mucus, polyps, erosion, perforation. + nasal crease             Throat- Mallampati II , mucosa  clear , drainage- none, tonsils- atrophic, own teeth Neck- flexible , trachea midline, no stridor , thyroid nl, carotid no bruit Chest - symmetrical excursion , unlabored           Heart/CV- RRR , no murmur , no gallop  , no rub, nl s1 s2                           - JVD- none , edema- none, stasis changes- none, varices- none           Lung-  distant, wheeze+ Insp/ Exp, cough- none , dullness-none, rub- none           Chest wall-  Abd-  Br/ Gen/ Rectal- Not done, not indicated Extrem- cyanosis- none, clubbing, none, atrophy- none, strength- nl Neuro- grossly intact to observation

## 2013-04-28 NOTE — Telephone Encounter (Signed)
Spoke with patient-states her Rx at [facility]; They can replace Nasonex with fluticasone spray and then Ventolin HFA can be changed to ProAir HFA. Otherwise the other inhalers [facility] [employer] and [facility]) are not changeable. Please advise.

## 2013-04-28 NOTE — Telephone Encounter (Signed)
Spoke with patient-she has [facility] plan choice. She will call her insurance company to get formulary list and call me back with the meds her insurance will cover for her. Will be about an hour or so as patient is at work. Pt will ask to speak directly with me-aware if I am with a patient then I will need to call her back.

## 2013-04-28 NOTE — Telephone Encounter (Signed)
If she is on [employer]- do we have that formulary?

## 2013-04-29 MED ORDER — ALBUTEROL SULFATE HFA 108 (90 BASE) MCG/ACT IN AERS: 2.0000 | INHALATION_SPRAY | Freq: Four times a day (QID) | RESPIRATORY_TRACT | Status: AC | PRN

## 2013-04-29 MED ORDER — FLUTICASONE PROPIONATE 50 MCG/ACT NA SUSP: 2.0000 | Freq: Every day | NASAL | Status: DC

## 2013-04-29 NOTE — Telephone Encounter (Signed)
Per CY-we can send Rx for Flonase #1 2 puffs each nostril daily with prn refills and ProAir HFA #1 2 puffs every 4 hours prn with prn refills or 90 day supply if patient needs it this way. As far as the other inhalers we can see about patient assistance.

## 2013-04-29 NOTE — Telephone Encounter (Signed)
Pt would like her rx's sent in to [facility]. Patient assistance forms and samples are upfront for pick up.

## 2013-04-29 NOTE — Telephone Encounter (Signed)
lmtcb x1 

## 2013-05-06 ENCOUNTER — Telehealth: Payer: Self-pay | Admitting: Internal Medicine

## 2013-05-06 MED ORDER — PREDNISONE 10 MG PO TABS: ORAL_TABLET | ORAL | Status: DC

## 2013-05-06 NOTE — Telephone Encounter (Signed)
Spoke with pt and aware of recs. RX has been sent. Nothing further needed

## 2013-05-06 NOTE — Telephone Encounter (Signed)
Per Cy-lets give patient Prednisone 10 mg 320 take 4 x 2 days, 3 x 2 days, 2 x 2 days, 1 x 2 days, then stop no refills. Pt to cal if this doesn't help after completing medication. Thanks .

## 2013-05-06 NOTE — Telephone Encounter (Signed)
Pt c/o increased SOB--difficulty breathing, cough with clear mucus production, "rattle in chest" and audible wheezing x 1 day. Pt requests an appt with [facility] today. Allergies  Allergen Reactions  . Benadryl [Diphenhydramine Hcl] Hives and Shortness Of Breath              Medication List   Aclidinium Bromide 400 MCG/ACT Aepb  Commonly known as:  TUDORZA PRESSAIR  Inhale 1 puff into the lungs 2 (two) times daily.     albuterol 108 (90 BASE) MCG/ACT inhaler  Commonly known as:  PROAIR HFA  Inhale 2 puffs into the lungs every 6 (six) hours as needed for wheezing or shortness of breath.     azithromycin 250 MG tablet  Commonly known as:  ZITHROMAX Z-PAK  Take as directed     fluticasone 50 MCG/ACT nasal spray  Commonly known as:  FLONASE  Place 2 sprays into both nostrils daily.     mometasone 50 MCG/ACT nasal spray  Commonly known as:  NASONEX  Place 2 sprays into the nose daily.     mometasone-formoterol 200-5 MCG/ACT Aero  Commonly known as:  DULERA  2 puffs then rinse mouth, twice daily- maintenance     predniSONE 10 MG tablet  Commonly known as:  DELTASONE  Take 4 tabs x 2 days, 3 tabs x 2 days, 2 tabs x 2 days, 1 tab x 2 days then stop       [facility] [address]  Please advise Dr Maple Hudson. Thanks.

## 2013-05-24 NOTE — Assessment & Plan Note (Signed)
Spirometry scores her significantly worse currently, consistent with exacerbation of asthma component. Medications were discussed. She had run out of all of them. Plan-refill meds. Nebulizer Xopenex and Depo-Medrol today.

## 2013-06-06 ENCOUNTER — Ambulatory Visit (INDEPENDENT_AMBULATORY_CARE_PROVIDER_SITE_OTHER): Payer: 59 | Admitting: Internal Medicine

## 2013-06-06 ENCOUNTER — Encounter (INDEPENDENT_AMBULATORY_CARE_PROVIDER_SITE_OTHER): Payer: Self-pay

## 2013-06-06 ENCOUNTER — Ambulatory Visit (INDEPENDENT_AMBULATORY_CARE_PROVIDER_SITE_OTHER)
Admission: RE | Admit: 2013-06-06 | Discharge: 2013-06-06 | Disposition: A | Discharge status: Home / Self Care | Payer: 59 | Source: Ambulatory Visit | Attending: Internal Medicine | Admitting: Internal Medicine

## 2013-06-06 ENCOUNTER — Encounter: Payer: Self-pay | Admitting: Internal Medicine

## 2013-06-06 VITALS — BP 120/64 | HR 76 | Ht 61.0 in | Wt 195.0 lb

## 2013-06-06 DIAGNOSIS — J449 Chronic obstructive pulmonary disease, unspecified: Secondary | ICD-10-CM

## 2013-06-06 DIAGNOSIS — Z23 Encounter for immunization: Secondary | ICD-10-CM

## 2013-06-06 NOTE — Patient Instructions (Signed)
We can continue present meds. Please call as needed.  Flu vax  Pneumovax -23  Order CXR   Dx COPD

## 2013-06-06 NOTE — Progress Notes (Signed)
05/23/12- 67 yoF never smoker referred courtesy of [facility], concerned about asthma with cough. She began noticing wheezing dyspnea with exertion during the summer of 2013. She was told in November of 2013 she had asthma. Cold air, strong odors  make her worse and she gets relief from her rescue inhaler. Allergy skin testing by Dr. Ishmael Holter did not lead to allergy vaccine. PFT showed severe obstructive disease. She has been using Dulera 200 and Qvar 80. She uses her rescue inhaler so that she can walk from the parking lot into work and uses it about 3 times per day typically. Cough has required Hycodan. Sputum is mostly clear or trace yellow. She denies past history of pneumonia or any prior lung condition or heart disease. There was a clotting disorder when she was pregnant but she denies VTE. 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 PFT- 04/04/12- FVC 2.71/84%, FEV1 1.20/48%, FEV1/FVC 0.44, no response to bronchodilator, TLC 90%, DLCO 68%. The curve looks like emphysema. Office spirometry 05/23/2012-FVC 2.60/100%, FEV1 1.2/57%, FEV1/FVC 0.48, FEF 25-75% 0.49. CXR 03/28/12 IMPRESSION:  No active lung disease. Question of small granuloma anteriorly on  the lateral view either within the lingula or right middle lobe.  Original Report Authenticated By: Ivar Drape, M.D.   07/10/12- 32 yoF never smoker referred courtesy of [facility], concerned about asthma with cough FOLLOWS FOR: has noticed wheezing, SOB, and cough has returned since last visit; also review 6MW test with patient. Cold air triggers wheeze cough and nasal congestion. Continues Dulera 200 and [place]. PFT 04/04/2012 had demonstrated severe emphysema. 6MWT 07/10/12- 98%, 97%, 97%, 456 m. No oxygen limitation to this exercise. a1AT phenotype 05/23/12- Normal variant M1M2, level 126  04/25/13- 24 yoF never smoker/ + secondhand, followed for COPD with  asthma FOLLOWS FOR: Breathing is unchanged. Still reports SOB, wheezing, coughing and chest tightness. Cough for the past month with wheeze for 2 or 3 weeks. Caprice Renshaw helped. Ran out of medicines 3 weeks ago. Itching and sneezing. No fever or green. Allergy skin test positive only for house dust in the past. Office Spirometry 04/25/2013-very severe obstruction. FVC 1.40/54%, FEV1 0.64/29%, FEV1/FEC 0.46, FEF 25-75% 0.26/9%. CXR 03/19/12 IMPRESSION:  No active lung disease. Question of small granuloma anteriorly on  the lateral view either within the lingula or right middle lobe.  Original Report Authenticated By: Ivar Drape, M.D.  06/06/13- 48 yoF never smoker/ + secondhand, followed for severe COPD with asthma FOLLOWS FOR: cough-productive slightly-clear; wheezing at times. Using [facility] and occasional rescue inhaler. Feels controlled most of the time.  ROS-see HPI Constitutional:   No-   weight loss, night sweats, fevers, chills, fatigue, lassitude. HEENT:   No-  headaches, difficulty swallowing, tooth/dental problems, +sore throat,       No-  sneezing, itching, ear ache, nasal congestion, post nasal drip,  CV:  No-   chest pain, orthopnea, PND, swelling in lower extremities, anasarca, dizziness, palpitations Resp: +  shortness of breath with exertion or at rest.              +  productive cough,  No non-productive cough,  No- coughing up of blood.              No-   change in color of mucus.  + wheezing.   Skin: No-   rash or lesions. GI:  No-   heartburn, indigestion, abdominal pain, nausea, vomiting,  GU: n.  MS:  No-   joint pain or swelling.   Neuro-     nothing unusual Psych:  No- change in mood or affect. No depression or anxiety.  No memory loss.  OBJ- Physical Exam General- Alert, Oriented, Affect-appropriate, Distress- none acute Skin- rash-none, lesions- none, excoriation- none Lymphadenopathy- none Head- atraumatic            Eyes- Gross vision intact, PERRLA,  conjunctivae and secretions clear            Ears- Hearing, canals-normal            Nose- Clear, no-Septal dev, mucus, polyps, erosion, perforation. + nasal crease             Throat- Mallampati II , mucosa clear , drainage- none, tonsils- atrophic, own teeth Neck- flexible , trachea midline, no stridor , thyroid nl, carotid no bruit Chest - symmetrical excursion , unlabored           Heart/CV- RRR , no murmur , no gallop  , no rub, nl s1 s2                           - JVD- none , edema- none, stasis changes- none, varices- none           Lung-  Distant, wheeze-none, cough+ light , dullness-none, rub- none           Chest wall-  Abd-  Br/ Gen/ Rectal- Not done, not indicated Extrem- cyanosis- none, clubbing, none, atrophy- none, strength- nl Neuro- grossly intact to observation

## 2013-07-03 NOTE — Assessment & Plan Note (Signed)
Baseline versus very mild exacerbation Plan-chest x-ray, flu vaccine, pneumonia vaccine. Consider CT chest later for screening if appropriate.

## 2013-07-07 ENCOUNTER — Telehealth: Payer: Self-pay | Admitting: Internal Medicine

## 2013-07-07 MED ORDER — PREDNISONE 10 MG PO TABS: ORAL_TABLET | ORAL | Status: DC

## 2013-07-07 NOTE — Telephone Encounter (Signed)
Pt is aware of CY's recs. Rx has been sent in. Pt is aware.

## 2013-07-07 NOTE — Telephone Encounter (Signed)
Pt is requesting a refill on Prednisone. Reports cough and wheezing x2 days. Mucus is yellow in color. SOB is present. Denies chest tightness. Has been using all prescribed inhalers with minimal relief.  Allergies  Allergen Reactions  . Benadryl [Diphenhydramine Hcl] Hives and Shortness Of Breath   Current Outpatient Prescriptions on File Prior to Visit  Medication Sig Dispense Refill  . Aclidinium Bromide (TUDORZA PRESSAIR) 400 MCG/ACT AEPB Inhale 1 puff into the lungs 2 (two) times daily.  1 each  prn  . albuterol (PROAIR HFA) 108 (90 BASE) MCG/ACT inhaler Inhale 2 puffs into the lungs every 6 (six) hours as needed for wheezing or shortness of breath.  3 Inhaler  1  . fluticasone (FLONASE) 50 MCG/ACT nasal spray Place 2 sprays into both nostrils daily.  48 g  1  . mometasone (NASONEX) 50 MCG/ACT nasal spray Place 2 sprays into the nose daily.  17 g  prn  . mometasone-formoterol (DULERA) 200-5 MCG/ACT AERO 2 puffs then rinse mouth, twice daily- maintenance  1 Inhaler  prn  . Multiple Vitamin (MULTIVITAMIN) tablet Take 1 tablet by mouth daily.       No current facility-administered medications on file prior to visit.    CY - please advise. Thanks.

## 2013-07-07 NOTE — Telephone Encounter (Signed)
Offer prednisone 10 mg, # 20, 4 X 2 DAYS, 3 X 2 DAYS, 2 X 2 DAYS, 1 X 2 DAYS  

## 2013-10-27 ENCOUNTER — Telehealth: Payer: Self-pay | Admitting: Internal Medicine

## 2013-10-27 MED ORDER — ALBUTEROL SULFATE (2.5 MG/3ML) 0.083% IN NEBU: 2.5000 mg | INHALATION_SOLUTION | Freq: Four times a day (QID) | RESPIRATORY_TRACT | Status: DC | PRN

## 2013-10-27 MED ORDER — PREDNISONE 10 MG PO TABS: ORAL_TABLET | ORAL | Status: DC

## 2013-10-27 NOTE — Telephone Encounter (Signed)
Pt aware of recs. rx sent in. Nothing further needed 

## 2013-10-27 NOTE — Telephone Encounter (Signed)
Offer prednisone 10 mg, # 20, 4 X 2 DAYS, 3 X 2 DAYS, 2 X 2 DAYS, 1 X 2 DAYS  

## 2013-10-27 NOTE — Telephone Encounter (Signed)
Spoke with pt  She is c/o wheezing, increased SOB, and cough with minimal yellow sputum x 4 days  She is using her rescue inhaler on average 3 x per day and neb with albuterol 2 x per day since her symptoms started  No openings today  Pt last seen 06/06/13  Next ov 12/05/13  Allergies  Allergen Reactions  . Benadryl [Diphenhydramine Hcl] Hives and Shortness Of Breath   Current Outpatient Prescriptions on File Prior to Visit  Medication Sig Dispense Refill  . Aclidinium Bromide (TUDORZA PRESSAIR) 400 MCG/ACT AEPB Inhale 1 puff into the lungs 2 (two) times daily.  1 each  prn  . albuterol (PROAIR HFA) 108 (90 BASE) MCG/ACT inhaler Inhale 2 puffs into the lungs every 6 (six) hours as needed for wheezing or shortness of breath.  3 Inhaler  1  . fluticasone (FLONASE) 50 MCG/ACT nasal spray Place 2 sprays into both nostrils daily.  48 g  1  . mometasone (NASONEX) 50 MCG/ACT nasal spray Place 2 sprays into the nose daily.  17 g  prn  . mometasone-formoterol (DULERA) 200-5 MCG/ACT AERO 2 puffs then rinse mouth, twice daily- maintenance  1 Inhaler  prn  . Multiple Vitamin (MULTIVITAMIN) tablet Take 1 tablet by mouth daily.      . predniSONE (DELTASONE) 10 MG tablet 4 X 2 DAYS, 3 X 2 DAYS, 2 X 2 DAYS, 1 X 2 DAYS  20 tablet  0   No current facility-administered medications on file prior to visit.

## 2013-11-17 ENCOUNTER — Telehealth: Payer: Self-pay | Admitting: Internal Medicine

## 2013-11-17 MED ORDER — PREDNISONE 10 MG PO TABS: ORAL_TABLET | ORAL | Status: DC

## 2013-11-17 NOTE — Telephone Encounter (Signed)
Pt aware of recs. RX sent to the pharm. Nothing further needed 

## 2013-11-17 NOTE — Telephone Encounter (Signed)
Pt c/o increased of chest tightness, feeling of fluid in chest, SOB, decrease in activity d/t SOB, cough, low grade fever and wheezing x 4 days.  Using inhalers/nebs as directed Pt requesting prednisone be sent to pharmacy if appt is not needed.  Allergies  Allergen Reactions  . Benadryl [Diphenhydramine Hcl] Hives and Shortness Of Breath   Please advise Dr Annamaria Boots. Thanks.   Current Outpatient Prescriptions on File Prior to Visit  Medication Sig Dispense Refill  . Aclidinium Bromide (TUDORZA PRESSAIR) 400 MCG/ACT AEPB Inhale 1 puff into the lungs 2 (two) times daily.  1 each  prn  . albuterol (PROAIR HFA) 108 (90 BASE) MCG/ACT inhaler Inhale 2 puffs into the lungs every 6 (six) hours as needed for wheezing or shortness of breath.  3 Inhaler  1  . albuterol (PROVENTIL) (2.5 MG/3ML) 0.083% nebulizer solution Take 3 mLs (2.5 mg total) by nebulization every 6 (six) hours as needed for wheezing or shortness of breath.  360 mL  3  . fluticasone (FLONASE) 50 MCG/ACT nasal spray Place 2 sprays into both nostrils daily.  48 g  1  . mometasone (NASONEX) 50 MCG/ACT nasal spray Place 2 sprays into the nose daily.  17 g  prn  . mometasone-formoterol (DULERA) 200-5 MCG/ACT AERO 2 puffs then rinse mouth, twice daily- maintenance  1 Inhaler  prn  . Multiple Vitamin (MULTIVITAMIN) tablet Take 1 tablet by mouth daily.      . predniSONE (DELTASONE) 10 MG tablet 4 X 2 DAYS, 3 X 2 DAYS, 2 X 2 DAYS, 1 X 2 DAYS  20 tablet  0   No current facility-administered medications on file prior to visit.

## 2013-11-17 NOTE — Telephone Encounter (Signed)
Ok prednisone 10 mg, # 20, 4 X 2 DAYS, 3 X 2 DAYS, 2 X 2 DAYS, 1 X 2 DAYS  

## 2013-12-05 ENCOUNTER — Encounter: Payer: Self-pay | Admitting: Internal Medicine

## 2013-12-05 ENCOUNTER — Ambulatory Visit (INDEPENDENT_AMBULATORY_CARE_PROVIDER_SITE_OTHER): Payer: 59 | Admitting: Internal Medicine

## 2013-12-05 ENCOUNTER — Other Ambulatory Visit (INDEPENDENT_AMBULATORY_CARE_PROVIDER_SITE_OTHER): Payer: 59

## 2013-12-05 ENCOUNTER — Encounter (INDEPENDENT_AMBULATORY_CARE_PROVIDER_SITE_OTHER): Payer: Self-pay

## 2013-12-05 VITALS — BP 110/60 | HR 75 | Ht 61.0 in | Wt 200.4 lb

## 2013-12-05 DIAGNOSIS — J449 Chronic obstructive pulmonary disease, unspecified: Secondary | ICD-10-CM

## 2013-12-05 LAB — CBC WITH DIFFERENTIAL/PLATELET
Basophils Absolute: 0 10*3/uL (ref 0.0–0.1)
Basophils Relative: 0.6 % (ref 0.0–3.0)
Eosinophils Absolute: 0.1 10*3/uL (ref 0.0–0.7)
Eosinophils Relative: 2.8 % (ref 0.0–5.0)
HCT: 36.8 % (ref 36.0–46.0)
Hemoglobin: 11.9 g/dL — ABNORMAL LOW (ref 12.0–15.0)
LYMPHS PCT: 28.2 % (ref 12.0–46.0)
Lymphs Abs: 1.4 10*3/uL (ref 0.7–4.0)
MCHC: 32.2 g/dL (ref 30.0–36.0)
MCV: 83.4 fl (ref 78.0–100.0)
Monocytes Absolute: 0.4 10*3/uL (ref 0.1–1.0)
Monocytes Relative: 8.5 % (ref 3.0–12.0)
NEUTROS PCT: 59.9 % (ref 43.0–77.0)
Neutro Abs: 2.9 10*3/uL (ref 1.4–7.7)
PLATELETS: 174 10*3/uL (ref 150.0–400.0)
RBC: 4.42 Mil/uL (ref 3.87–5.11)
RDW: 13.7 % (ref 11.5–15.5)
WBC: 4.8 10*3/uL (ref 4.0–10.5)

## 2013-12-05 MED ORDER — PREDNISONE 10 MG PO TABS: ORAL_TABLET | ORAL | Status: DC

## 2013-12-05 NOTE — Progress Notes (Signed)
05/23/12- 70 yoF never smoker referred courtesy of Chana Bode, concerned about asthma with cough. She began noticing wheezing dyspnea with exertion during the summer of 2013. She was told in November of 2013 she had asthma. Cold air, strong odors  make her worse and she gets relief from her rescue inhaler. Allergy skin testing by Dr. Ishmael Holter did not lead to allergy vaccine. PFT showed severe obstructive disease. She has been using Dulera 200 and Qvar 80. She uses her rescue inhaler so that she can walk from the parking lot into work and uses it about 3 times per day typically. Cough has required Hycodan. Sputum is mostly clear or trace yellow. She denies past history of pneumonia or any prior lung condition or heart disease. There was a clotting disorder when she was pregnant but she denies VTE. 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 PFT- 04/04/12- FVC 2.71/84%, FEV1 1.20/48%, FEV1/FVC 0.44, no response to bronchodilator, TLC 90%, DLCO 68%. The curve looks like emphysema. Office spirometry 05/23/2012-FVC 2.60/100%, FEV1 1.2/57%, FEV1/FVC 0.48, FEF 25-75% 0.49. CXR 03/28/12 IMPRESSION:  No active lung disease. Question of small granuloma anteriorly on  the lateral view either within the lingula or right middle lobe.  Original Report Authenticated By: Ivar Drape, M.D.   07/10/12- 5 yoF never smoker referred courtesy of Chana Bode, concerned about asthma with cough FOLLOWS FOR: has noticed wheezing, SOB, and cough has returned since last visit; also review 6MW test with patient. Cold air triggers wheeze cough and nasal congestion. Continues Dulera 200 and [place]. PFT 04/04/2012 had demonstrated severe emphysema. 6MWT 07/10/12- 98%, 97%, 97%, 456 m. No oxygen limitation to this exercise. a1AT phenotype 05/23/12- Normal variant M1M2, level 126  04/25/13- 66 yoF never smoker/ + secondhand, followed for COPD with  asthma FOLLOWS FOR: Breathing is unchanged. Still reports SOB, wheezing, coughing and chest tightness. Cough for the past month with wheeze for 2 or 3 weeks. Caprice Renshaw helped. Ran out of medicines 3 weeks ago. Itching and sneezing. No fever or green. Allergy skin test positive only for house dust in the past. Office Spirometry 04/25/2013-very severe obstruction. FVC 1.40/54%, FEV1 0.64/29%, FEV1/FEC 0.46, FEF 25-75% 0.26/9%. CXR 03/19/12 IMPRESSION:  No active lung disease. Question of small granuloma anteriorly on  the lateral view either within the lingula or right middle lobe.  Original Report Authenticated By: Ivar Drape, M.D.  06/06/13- 48 yoF never smoker/ + secondhand, followed for severe COPD with asthma FOLLOWS FOR: cough-productive slightly-clear; wheezing at times. Using [facility] and occasional rescue inhaler. Feels controlled most of the time.  12/05/13- 60 yoF never smoker/ + secondhand, followed for COPD with asthma FOLLOWS FOR: Has noticed she has started wheezing and coughing recently-feels like she is getting sick. Pt would like to have Rx for Prednisone on hand with refills(she is set to go out of town in few week). 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 Started to wheeze 2 days ago-"not a cold" CXR 06/06/13 IMPRESSION:  No active cardiopulmonary disease.  Electronically Signed  By: Inez Catalina M.D.  On: 06/06/2013 10:57   ROS-see HPI Constitutional:   No-   weight loss, night sweats, fevers, chills, fatigue, lassitude. HEENT:   No-  headaches, difficulty swallowing, tooth/dental problems,no-sore throat,       No-  sneezing, itching, ear ache, nasal congestion, post nasal drip,  CV:  No-   chest pain, orthopnea, PND, swelling  in lower extremities, anasarca, dizziness, palpitations Resp: +  shortness of breath with exertion or at rest.              No-  productive cough,  No non-productive cough,  No- coughing up of blood.              No-   change in  color of mucus.  + wheezing.   Skin: No-   rash or lesions. GI:  No-   heartburn, indigestion, abdominal pain, nausea, vomiting,  GU: n. MS:  No-   joint pain or swelling.   Neuro-     nothing unusual Psych:  No- change in mood or affect. No depression or anxiety.  No memory loss.  OBJ- Physical Exam General- Alert, Oriented, Affect-appropriate, Distress- none acute Skin- rash-none, lesions- none, excoriation- none Lymphadenopathy- none Head- atraumatic            Eyes- Gross vision intact, PERRLA, conjunctivae and secretions clear            Ears- Hearing, canals-normal            Nose- Clear, no-Septal dev, mucus, polyps, erosion, perforation. + nasal crease             Throat- Mallampati II , mucosa clear , drainage- none, tonsils- atrophic, own teeth Neck- flexible , trachea midline, no stridor , thyroid nl, carotid no bruit Chest - symmetrical excursion , unlabored           Heart/CV- RRR , no murmur , no gallop  , no rub, nl s1 s2                           - JVD- none , edema- none, stasis changes- none, varices- none           Lung-  Distant, wheeze+ mild, cough-none , dullness-none, rub- none           Chest wall-  Abd-  Br/ Gen/ Rectal- Not done, not indicated Extrem- cyanosis- none, clubbing, none, atrophy- none, strength- nl Neuro- grossly intact to observation

## 2013-12-05 NOTE — Patient Instructions (Addendum)
Script sent for prednisone taper with 1 refill  Order- lab- CBC w diff, Allergy profile    Dx COPD with asthma  Order- Schedule PFT

## 2013-12-08 LAB — ALLERGY FULL PROFILE
ALLERGEN, D PTERNOYSSINUS, D1: 0.18 kU/L — AB
Allergen,Goose feathers, e70: <0.1 kU/L
Aspergillus fumigatus, m3: <0.1 kU/L
Bermuda Grass: 0.1 kU/L — ABNORMAL HIGH
Box Elder IgE: <0.1 kU/L
Candida Albicans: <0.1 kU/L
Common Ragweed: <0.1 kU/L
Curvularia lunata: <0.1 kU/L
D. FARINAE: 0.22 kU/L — AB
Dog Dander: <0.1 kU/L
Fescue: <0.1 kU/L
G005 Rye, Perennial: <0.1 kU/L
G009 Red Top: <0.1 kU/L
Goldenrod: <0.1 kU/L
Helminthosporium halodes: <0.1 kU/L
IgE (Immunoglobulin E), Serum: 101.1 IU/mL (ref 0.0–180.0)
Oak: <0.1 kU/L
Stemphylium Botryosum: <0.1 kU/L

## 2014-02-10 ENCOUNTER — Telehealth: Payer: Self-pay | Admitting: Internal Medicine

## 2014-02-10 MED ORDER — PREDNISONE 10 MG PO TABS: ORAL_TABLET | ORAL | Status: DC

## 2014-02-10 NOTE — Telephone Encounter (Signed)
Called and spoke to pt. Informed pt of recs per CY. Rx sent to preferred pharm. Pt verbalized understanding and denied any further questions or concerns at this time.

## 2014-02-10 NOTE — Telephone Encounter (Signed)
Offer prednisone 10 mg, # 20, 4 X 2 DAYS, 3 X 2 DAYS, 2 X 2 DAYS, 1 X 2 DAYS  

## 2014-02-10 NOTE — Telephone Encounter (Signed)
Called spoke with patient who c/o increased SOB, wheezing, prod cough with clear mucus, head congestion and PND x5days.  Has been treating w/ Robitussin.  Denies any f/c/s, n/v/d, hemoptysis, leg swelling.  Dr Annamaria Boots please advise, thank you. [facility] [address] Allergies  Allergen Reactions  . Benadryl [Diphenhydramine Hcl] Hives and Shortness Of Breath

## 2014-02-23 ENCOUNTER — Encounter: Payer: Self-pay | Admitting: Internal Medicine

## 2014-02-23 ENCOUNTER — Ambulatory Visit (INDEPENDENT_AMBULATORY_CARE_PROVIDER_SITE_OTHER): Payer: 59 | Admitting: Internal Medicine

## 2014-02-23 ENCOUNTER — Other Ambulatory Visit (INDEPENDENT_AMBULATORY_CARE_PROVIDER_SITE_OTHER): Payer: 59

## 2014-02-23 VITALS — BP 112/80 | HR 74 | Temp 98.1°F | Resp 16 | Ht 61.0 in | Wt 205.4 lb

## 2014-02-23 DIAGNOSIS — Z0000 Encounter for general adult medical examination without abnormal findings: Secondary | ICD-10-CM

## 2014-02-23 DIAGNOSIS — J449 Chronic obstructive pulmonary disease, unspecified: Secondary | ICD-10-CM

## 2014-02-23 DIAGNOSIS — Z23 Encounter for immunization: Secondary | ICD-10-CM

## 2014-02-23 DIAGNOSIS — E669 Obesity, unspecified: Secondary | ICD-10-CM | POA: Insufficient documentation

## 2014-02-23 LAB — BASIC METABOLIC PANEL
BUN: 14 mg/dL (ref 6–23)
CO2: 25 meq/L (ref 19–32)
Calcium: 9 mg/dL (ref 8.4–10.5)
Chloride: 106 mEq/L (ref 96–112)
Creatinine, Ser: 0.5 mg/dL (ref 0.4–1.2)
GFR: 155.77 mL/min (ref >60.00)
GLUCOSE: 94 mg/dL (ref 70–99)
POTASSIUM: 4 meq/L (ref 3.5–5.1)
SODIUM: 141 meq/L (ref 135–145)

## 2014-02-23 LAB — LIPID PANEL
Cholesterol: 199 mg/dL (ref 0–200)
HDL: 52.7 mg/dL (ref >39.00)
LDL Cholesterol: 137 mg/dL — ABNORMAL HIGH (ref 0–99)
NONHDL: 146.3
Total CHOL/HDL Ratio: 4
Triglycerides: 48 mg/dL (ref 0.0–149.0)
VLDL: 9.6 mg/dL (ref 0.0–40.0)

## 2014-02-23 NOTE — Patient Instructions (Signed)
We have given you the flu shot and the tetanus shot today.   Work on increasing your exercise and working to lose a little weight to help with your breathing and to help stay healthy.   Come back in about 1 year for a check up or sooner if you have any problems or you are sick before then.  Exercise to Lose Weight Exercise and a healthy diet may help you lose weight. Your doctor may suggest specific exercises. EXERCISE IDEAS AND TIPS  Choose low-cost things you enjoy doing, such as walking, bicycling, or exercising to workout videos.  Take stairs instead of the elevator.  Walk during your lunch break.  Park your car further away from work or school.  Go to a gym or an exercise class.  Start with 5 to 10 minutes of exercise each day. Build up to 30 minutes of exercise 4 to 6 days a week.  Wear shoes with good support and comfortable clothes.  Stretch before and after working out.  Work out until you breathe harder and your heart beats faster.  Drink extra water when you exercise.  Do not do so much that you hurt yourself, feel dizzy, or get very short of breath. Exercises that burn about 150 calories:  Running 1  miles in 15 minutes.  Playing volleyball for 45 to 60 minutes.  Washing and waxing a car for 45 to 60 minutes.  Playing touch football for 45 minutes.  Walking 1  miles in 35 minutes.  Pushing a stroller 1  miles in 30 minutes.  Playing basketball for 30 minutes.  Raking leaves for 30 minutes.  Bicycling 5 miles in 30 minutes.  Walking 2 miles in 30 minutes.  Dancing for 30 minutes.  Shoveling snow for 15 minutes.  Swimming laps for 20 minutes.  Walking up stairs for 15 minutes.  Bicycling 4 miles in 15 minutes.  Gardening for 30 to 45 minutes.  Jumping rope for 15 minutes.  Washing windows or floors for 45 to 60 minutes. Document Released: 05/27/2010 Document Revised: 07/17/2011 Document Reviewed: 05/27/2010 [facility] Patient  Information 2015 [place], [place]. This information is not intended to replace advice given to you by your health care provider. Make sure you discuss any questions you have with your health care provider.  Serving Sizes What we call a serving size today is larger than it was in the past. A 1950s fast-food burger contained little more than 1 oz of meat, and a soft drink was 8 oz (1 cup). Today, a "quarter pounder" burger is at least 4 times that amount, and a 32 or 64 oz drink is not uncommon. A possible guide for eating when trying to lose weight is to eat about half as much as you normally do. Some estimates of serving sizes are:  1 Dairy serving:Individual container of yogurt (8 oz) or piece of cheese the size of your thumb (1 oz).  1 Grain serving: 1 slice of bread or  cup pasta.  1 Meat serving: The size of a deck of cards (3 oz).  1 Fruit serving: cup canned fruit or 1 medium fruit.  1 Vegetable serving:  cup of cooked or canned vegetables.  1 Fat serving:The size of 4 stacked dimes. Experts suggest spending 1 or 2 days measuring food portions you commonly eat. This will give you better practice at estimating serving sizes, and will also show whether you are eating an appropriate amount of food to meet your weight goals. If  you find that you are eating more than you thought, try measuring your food for a few days so you can "reprogram" yourself to learn what makes a healthy portion for you. SUGGESTIONS FOR CONTROL  In restaurants, share entrees, or ask the waiter to put half the entre in a box or bag before you even touch it.  Order lunch-sized portions. Many restaurants serve 4 to 6 oz of meat at lunch, compared with 8 to 10 oz at dinner.  Split dessert or skip it all together. Have a piece of fruit when you get home.  At home, use smaller plates and bowls. It will look as if you are eating more.  Plate your food in the kitchen rather than serving it "family style" at the  table.  Wait 20 to 30 minutes before taking seconds. This is how long it takes your brain to recognize that you are full.  Check food labels for serving sizes. Eat 1 serving only.  Use measuring cups and spoons to see proper serving sizes.  Buy smaller packages of candy, popcorn, and snacks.  Avoid eating directly out of the bag or carton.  While eating half as much, exercise twice as much. Park further away from the mall, take the stairs instead of the escalator, and walk around your block. Losing weight is a slow, difficult process. It takes long-lasting lifestyle changes. You can make gradual changes over time so they become habits. Look to friends and family to support the healthy changes you are making. Avoid fad diets since they are often only temporary weight loss solutions. Document Released: 01/21/2003 Document Revised: 07/17/2011 Document Reviewed: 07/22/2013 [facility] Patient Information 2015 [place], [place]. This information is not intended to replace advice given to you by your health care provider. Make sure you discuss any questions you have with your health care provider.

## 2014-02-23 NOTE — Progress Notes (Signed)
   Subjective:    Patient ID: Jocelyn Myers, female    DOB: 1965-12-13, 48 y.o.   MRN: [id number]  HPI The patient is a 48 year-old woman who comes in today to establish care. She does have past medical history of asthma with COPD. She sees pulmonology for this. She states that the on the medication she uses for her asthma is albuterol. She particularly notices that if she exercises she will get out of breath easily. She feels this has kept her from exercising and cause her to put on a little away. She would like to lose weight however struggles with exercise. She denies chest pains, abdominal pains, constipation, diarrhea. It has been some time since she's had a regular doctor. She is a nonsmoker.  Review of Systems  Constitutional: Negative for activity change, appetite change, fatigue and unexpected weight change.  HENT: Negative.   Eyes: Negative.   Respiratory: Positive for shortness of breath. Negative for cough, chest tightness and wheezing.        With activity  Cardiovascular: Negative for chest pain, palpitations and leg swelling.  Gastrointestinal: Negative for abdominal pain, diarrhea, constipation and abdominal distention.  Musculoskeletal: Negative.   Skin: Negative.   Neurological: Negative for dizziness, weakness, light-headedness and headaches.      Objective:   Physical Exam  Constitutional: She is oriented to person, place, and time. She appears well-developed and well-nourished.  Overweight  HENT:  Head: Normocephalic and atraumatic.  Eyes: EOM are normal.  Neck: Normal range of motion.  Cardiovascular: Normal rate and regular rhythm.   Pulmonary/Chest: Effort normal and breath sounds normal. No respiratory distress. She has no wheezes. She has no rales.  Abdominal: Soft. Bowel sounds are normal. She exhibits no distension. There is no tenderness. There is no rebound.  Neurological: She is alert and oriented to person, place, and time. Coordination normal.  Skin:  Skin is warm and dry.   Filed Vitals:   02/23/14 0913  BP: 112/80  Pulse: 74  Temp: 98.1 F (36.7 C)  TempSrc: Oral  Resp: 16  Height: 5\' 1"  (1.549 m)  Weight: 205 lb 6.4 oz (93.169 kg)  SpO2: 98%      Assessment & Plan:  Tdap and flu shot given at today's visit.

## 2014-02-23 NOTE — Assessment & Plan Note (Signed)
Check lipid panel today, reminded her that exercise is a good way to help lose weight and talk to her about serving sizes and portion as well.

## 2014-02-23 NOTE — Assessment & Plan Note (Signed)
She is not currently taking medications as prescribed and she does not wish to take them and she does not think they help. She does take Pro air when needed.

## 2014-02-23 NOTE — Progress Notes (Signed)
Pre visit review using our clinic review tool, if applicable. No additional management support is needed unless otherwise documented below in the visit note. 

## 2014-02-23 NOTE — Assessment & Plan Note (Signed)
Given Tdap/flu today. Check a basic metabolic panel as well as lipid panel. Encouraged to lose weight and exercise.

## 2014-03-09 ENCOUNTER — Telehealth: Payer: Self-pay | Admitting: Internal Medicine

## 2014-03-09 NOTE — Telephone Encounter (Signed)
Pt states she is wheezing, SOB, cough-clear in color, denies any fever or chills. Pt is aware of her appt with CY tomorrow but wants something to help and feels she can't wait until she is seen tomorrow-wants to start something tonight.    CY, Please advise. Thanks.   Pharmacy: [facility] [address]   Allergies  Allergen Reactions  . Benadryl [Diphenhydramine Hcl] Hives and Shortness Of Breath

## 2014-03-09 NOTE — Telephone Encounter (Signed)
Order prednisone 10 mg, # 20, 4 X 2 DAYS, 3 X 2 DAYS, 2 X 2 DAYS, 1 X 2 DAYS Keep appointment tomorrow

## 2014-03-10 ENCOUNTER — Ambulatory Visit: Payer: 59 | Admitting: Internal Medicine

## 2014-03-10 ENCOUNTER — Encounter: Payer: Self-pay | Admitting: Internal Medicine

## 2014-03-10 ENCOUNTER — Ambulatory Visit (INDEPENDENT_AMBULATORY_CARE_PROVIDER_SITE_OTHER): Payer: 59 | Admitting: Internal Medicine

## 2014-03-10 VITALS — BP 100/76 | HR 83 | Ht 61.0 in | Wt 207.0 lb

## 2014-03-10 DIAGNOSIS — J449 Chronic obstructive pulmonary disease, unspecified: Secondary | ICD-10-CM

## 2014-03-10 LAB — PULMONARY FUNCTION TEST
DL/VA % PRED: 134 %
DL/VA: 5.93 ml/min/mmHg/L
DLCO unc % pred: 109 %
DLCO unc: 22.15 ml/min/mmHg
FEF 25-75 Post: 0.41 L/sec
FEF 25-75 Pre: 0.44 L/sec
FEF2575-%CHANGE-POST: -6 %
FEF2575-%PRED-PRE: 18 %
FEF2575-%Pred-Post: 17 %
FEV1-%Change-Post: -6 %
FEV1-%PRED-POST: 44 %
FEV1-%Pred-Pre: 47 %
FEV1-POST: 0.95 L
FEV1-Pre: 1.01 L
FEV1FVC-%CHANGE-POST: -3 %
FEV1FVC-%Pred-Pre: 53 %
FEV6-%CHANGE-POST: -4 %
FEV6-%Pred-Post: 82 %
FEV6-%Pred-Pre: 85 %
FEV6-PRE: 2.2 L
FEV6-Post: 2.11 L
FEV6FVC-%Change-Post: 0 %
FEV6FVC-%PRED-POST: 97 %
FEV6FVC-%Pred-Pre: 98 %
FVC-%Change-Post: -3 %
FVC-%PRED-POST: 84 %
FVC-%Pred-Pre: 87 %
FVC-Post: 2.22 L
FVC-Pre: 2.29 L
POST FEV1/FVC RATIO: 43 %
POST FEV6/FVC RATIO: 95 %
Pre FEV1/FVC ratio: 44 %
Pre FEV6/FVC Ratio: 96 %
RV % PRED: 138 %
RV: 2.17 L
TLC % PRED: 103 %
TLC: 4.76 L

## 2014-03-10 MED ORDER — PREDNISONE 10 MG PO TABS: ORAL_TABLET | ORAL | Status: DC

## 2014-03-10 MED ORDER — MONTELUKAST SODIUM 10 MG PO TABS: 10.0000 mg | ORAL_TABLET | Freq: Every day | ORAL | Status: DC

## 2014-03-10 NOTE — Telephone Encounter (Signed)
Pt had appt with CY today on 11/3. Rx sent to pt's preferred pharmacy. Pt aware. Nothing further needed.

## 2014-03-10 NOTE — Patient Instructions (Signed)
Take the prednisone taper we sent to your drug store  Script also sent to start Singulair/ montelukast airway anti-inflammatory pill, one daily  Continue your regular meds  When you come back, we will do an office spirometry to compare airflow with the PFT done today while you were sick.  Please call as needed

## 2014-03-10 NOTE — Progress Notes (Signed)
05/23/12- 68 yoF never smoker referred courtesy of Chana Bode, concerned about asthma with cough. She began noticing wheezing dyspnea with exertion during the summer of 2013. She was told in November of 2013 she had asthma. Cold air, strong odors  make her worse and she gets relief from her rescue inhaler. Allergy skin testing by Dr. Ishmael Holter did not lead to allergy vaccine. PFT showed severe obstructive disease. She has been using Dulera 200 and Qvar 80. She uses her rescue inhaler so that she can walk from the parking lot into work and uses it about 3 times per day typically. Cough has required Hycodan. Sputum is mostly clear or trace yellow. She denies past history of pneumonia or any prior lung condition or heart disease. There was a clotting disorder when she was pregnant but she denies VTE. 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 PFT- 04/04/12- FVC 2.71/84%, FEV1 1.20/48%, FEV1/FVC 0.44, no response to bronchodilator, TLC 90%, DLCO 68%. The curve looks like emphysema. Office spirometry 05/23/2012-FVC 2.60/100%, FEV1 1.2/57%, FEV1/FVC 0.48, FEF 25-75% 0.49. CXR 03/28/12 IMPRESSION:  No active lung disease. Question of small granuloma anteriorly on  the lateral view either within the lingula or right middle lobe.  Original Report Authenticated By: Ivar Drape, M.D.   07/10/12- 61 yoF never smoker referred courtesy of Chana Bode, concerned about asthma with cough FOLLOWS FOR: has noticed wheezing, SOB, and cough has returned since last visit; also review 6MW test with patient. Cold air triggers wheeze cough and nasal congestion. Continues Dulera 200 and [place]. PFT 04/04/2012 had demonstrated severe emphysema. 6MWT 07/10/12- 98%, 97%, 97%, 456 m. No oxygen limitation to this exercise. a1AT phenotype 05/23/12- Normal variant M1M2, level 126  04/25/13- 79 yoF never smoker/ + secondhand, followed for COPD with  asthma FOLLOWS FOR: Breathing is unchanged. Still reports SOB, wheezing, coughing and chest tightness. Cough for the past month with wheeze for 2 or 3 weeks. Caprice Renshaw helped. Ran out of medicines 3 weeks ago. Itching and sneezing. No fever or green. Allergy skin test positive only for house dust in the past. Office Spirometry 04/25/2013-very severe obstruction. FVC 1.40/54%, FEV1 0.64/29%, FEV1/FEC 0.46, FEF 25-75% 0.26/9%. CXR 03/19/12 IMPRESSION:  No active lung disease. Question of small granuloma anteriorly on  the lateral view either within the lingula or right middle lobe.  Original Report Authenticated By: Ivar Drape, M.D.  06/06/13- 48 yoF never smoker/ + secondhand, followed for severe COPD with asthma FOLLOWS FOR: cough-productive slightly-clear; wheezing at times. Using [facility] and occasional rescue inhaler. Feels controlled most of the time.  12/05/13- 58 yoF never smoker/ + secondhand, followed for COPD with asthma FOLLOWS FOR: Has noticed she has started wheezing and coughing recently-feels like she is getting sick. Pt would like to have Rx for Prednisone on hand with refills(she is set to go out of town in few week). CXR 06/06/13 IMPRESSION:  No active cardiopulmonary disease.  Electronically Signed  By: Inez Catalina M.D.  On: 06/06/2013 10:57   03/10/14- 22 yoF never smoker/ + secondhand, followed for COPD with asthma FOLLOWS FOR: Pt c/o increase in SOB, prod cough with clear mucus, wheezing. Pt requesting pred taper. Pt denies CP/tightness.  Acute illness 4 days, blaming the weather. Cough, wheeze, no sore throat or discolored sputum "not a cold". Today had used metered inhaler before she came for PFT. Has had 2 prednisone tapers since July and stays well for 2 or 3  months after each. PFT 03/10/14- severe obstructive airways disease with insignificant response to bronchodilator, air trapping, normal diffusion. FEV1 0.95/44%. "This is a bad day". Allergy Profile 12/05/2013-total  IgE 101.1 with elevations for dust mite and [place] grass pollen  ROS-see HPI Constitutional:   No-   weight loss, night sweats, fevers, chills, fatigue, lassitude. HEENT:   No-  headaches, difficulty swallowing, tooth/dental problems, +sore throat,       No-  sneezing, itching, ear ache, nasal congestion, post nasal drip,  CV:  No-   chest pain, orthopnea, PND, swelling in lower extremities, anasarca, dizziness, palpitations Resp: +  shortness of breath with exertion or at rest.              +  productive cough,  No non-productive cough,  No- coughing up of blood.              No-   change in color of mucus.  + wheezing.   Skin: No-   rash or lesions. GI:  No-   heartburn, indigestion, abdominal pain, nausea, vomiting,  GU: n. MS:  No-   joint pain or swelling.   Neuro-     nothing unusual Psych:  No- change in mood or affect. No depression or anxiety.  No memory loss.  OBJ- Physical Exam General- Alert, Oriented, Affect-appropriate, Distress- none acute Skin- rash-none, lesions- none, excoriation- none Lymphadenopathy- none Head- atraumatic            Eyes- Gross vision intact, PERRLA, conjunctivae and secretions clear            Ears- Hearing, canals-normal            Nose- Clear, no-Septal dev, mucus, polyps, erosion, perforation. + nasal crease             Throat- Mallampati II , mucosa clear , drainage- none, tonsils- atrophic, own teeth Neck- flexible , trachea midline, no stridor , thyroid nl, carotid no bruit Chest - symmetrical excursion , unlabored           Heart/CV- RRR , no murmur , no gallop  , no rub, nl s1 s2                           - JVD- none , edema- none, stasis changes- none, varices- none           Lung-  Distant, wheeze-+I&E, unlabored, cough+ light , dullness-none, rub- none           Chest wall-  Abd-  Br/ Gen/ Rectal- Not done, not indicated Extrem- cyanosis- none, clubbing, none, atrophy- none, strength- nl Neuro- grossly intact to observation

## 2014-03-10 NOTE — Progress Notes (Signed)
PFT done today. Amory Simonetti,CMA  

## 2014-03-15 NOTE — Assessment & Plan Note (Signed)
Acute exacerbation blamed on heavy dust exposure event Plan-prednisone taper, schedule PFT

## 2014-03-30 ENCOUNTER — Telehealth: Payer: Self-pay | Admitting: Internal Medicine

## 2014-03-30 MED ORDER — PREDNISONE 10 MG PO TABS: ORAL_TABLET | ORAL | Status: DC

## 2014-03-30 NOTE — Telephone Encounter (Signed)
Pt was given prednisone RX Take 4 tab for 2 days, 3 tab for 2 days, 2 tab for 2 tabs, 1 tab for 2 days and then stop on 03/10/14.  Pt reports she is still coughing up clear phlem, wheezing and SOB unchanged. Pt wants prednisone refilled. Please advise Dr. Annamaria Boots thanks  Allergies  Allergen Reactions  . Benadryl [Diphenhydramine Hcl] Hives and Shortness Of Breath    Current Outpatient Prescriptions on File Prior to Visit  Medication Sig Dispense Refill  . Aclidinium Bromide (TUDORZA PRESSAIR) 400 MCG/ACT AEPB Inhale 1 puff into the lungs 2 (two) times daily. 1 each prn  . albuterol (PROAIR HFA) 108 (90 BASE) MCG/ACT inhaler Inhale 2 puffs into the lungs every 6 (six) hours as needed for wheezing or shortness of breath. 3 Inhaler 1  . albuterol (PROVENTIL) (2.5 MG/3ML) 0.083% nebulizer solution Take 3 mLs (2.5 mg total) by nebulization every 6 (six) hours as needed for wheezing or shortness of breath. 360 mL 3  . fluticasone (FLONASE) 50 MCG/ACT nasal spray Place 2 sprays into both nostrils daily. 48 g 1  . mometasone (NASONEX) 50 MCG/ACT nasal spray Place 2 sprays into the nose daily. 17 g prn  . mometasone-formoterol (DULERA) 200-5 MCG/ACT AERO 2 puffs then rinse mouth, twice daily- maintenance 1 Inhaler prn  . montelukast (SINGULAIR) 10 MG tablet Take 1 tablet (10 mg total) by mouth at bedtime. 30 tablet 11  . Multiple Vitamin (MULTIVITAMIN) capsule Take 1 capsule by mouth daily.    . predniSONE (DELTASONE) 10 MG tablet Take 4 tab for 2 days, 3 tab for 2 days, 2 tab for 2 tabs, 1 tab for 2 days and then stop 20 tablet 0   No current facility-administered medications on file prior to visit.

## 2014-03-30 NOTE — Telephone Encounter (Signed)
Per CDY okay to call in prednisone.  Called spoke with pt. She is aware. RX sent in. Nothing further needed

## 2014-04-07 ENCOUNTER — Other Ambulatory Visit: Payer: Self-pay | Admitting: *Deleted

## 2014-04-07 MED ORDER — MONTELUKAST SODIUM 10 MG PO TABS: 10.0000 mg | ORAL_TABLET | Freq: Every day | ORAL | Status: DC

## 2014-04-30 ENCOUNTER — Other Ambulatory Visit: Payer: Self-pay | Admitting: *Deleted

## 2014-04-30 MED ORDER — MONTELUKAST SODIUM 10 MG PO TABS: 10.0000 mg | ORAL_TABLET | Freq: Every day | ORAL | Status: DC

## 2014-05-11 ENCOUNTER — Ambulatory Visit: Payer: 59 | Admitting: Internal Medicine

## 2014-05-11 ENCOUNTER — Encounter: Payer: Self-pay | Admitting: Internal Medicine

## 2014-05-11 VITALS — BP 110/78 | HR 75 | Ht 61.5 in | Wt 206.2 lb

## 2014-05-11 DIAGNOSIS — J449 Chronic obstructive pulmonary disease, unspecified: Secondary | ICD-10-CM

## 2014-05-11 NOTE — Progress Notes (Signed)
05/23/12- 56 yoF never smoker referred courtesy of Chana Bode, concerned about asthma with cough. She began noticing wheezing dyspnea with exertion during the summer of 2013. She was told in November of 2013 she had asthma. Cold air, strong odors  make her worse and she gets relief from her rescue inhaler. Allergy skin testing by Dr. Ishmael Holter did not lead to allergy vaccine. PFT showed severe obstructive disease. She has been using Dulera 200 and Qvar 80. She uses her rescue inhaler so that she can walk from the parking lot into work and uses it about 3 times per day typically. Cough has required Hycodan. Sputum is mostly clear or trace yellow. She denies past history of pneumonia or any prior lung condition or heart disease. There was a clotting disorder when she was pregnant but she denies VTE. 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 PFT- 04/04/12- FVC 2.71/84%, FEV1 1.20/48%, FEV1/FVC 0.44, no response to bronchodilator, TLC 90%, DLCO 68%. The curve looks like emphysema. Office spirometry 05/23/2012-FVC 2.60/100%, FEV1 1.2/57%, FEV1/FVC 0.48, FEF 25-75% 0.49. CXR 03/28/12 IMPRESSION:  No active lung disease. Question of small granuloma anteriorly on  the lateral view either within the lingula or right middle lobe.  Original Report Authenticated By: Ivar Drape, M.D.   07/10/12- 64 yoF never smoker referred courtesy of Chana Bode, concerned about asthma with cough FOLLOWS FOR: has noticed wheezing, SOB, and cough has returned since last visit; also review 6MW test with patient. Cold air triggers wheeze cough and nasal congestion. Continues Dulera 200 and [place]. PFT 04/04/2012 had demonstrated severe emphysema. 6MWT 07/10/12- 98%, 97%, 97%, 456 m. No oxygen limitation to this exercise. a1AT phenotype 05/23/12- Normal variant M1M2, level 126  04/25/13- 12 yoF never smoker/ + secondhand, followed for COPD with  asthma FOLLOWS FOR: Breathing is unchanged. Still reports SOB, wheezing, coughing and chest tightness. Cough for the past month with wheeze for 2 or 3 weeks. Caprice Renshaw helped. Ran out of medicines 3 weeks ago. Itching and sneezing. No fever or green. Allergy skin test positive only for house dust in the past. Office Spirometry 04/25/2013-very severe obstruction. FVC 1.40/54%, FEV1 0.64/29%, FEV1/FEC 0.46, FEF 25-75% 0.26/9%. CXR 03/19/12 IMPRESSION:  No active lung disease. Question of small granuloma anteriorly on  the lateral view either within the lingula or right middle lobe.  Original Report Authenticated By: Ivar Drape, M.D.  06/06/13- 48 yoF never smoker/ + secondhand, followed for severe COPD with asthma FOLLOWS FOR: cough-productive slightly-clear; wheezing at times. Using [facility] and occasional rescue inhaler. Feels controlled most of the time.  12/05/13- 83 yoF never smoker/ + secondhand, followed for COPD with asthma FOLLOWS FOR: Has noticed she has started wheezing and coughing recently-feels like she is getting sick. Pt would like to have Rx for Prednisone on hand with refills(she is set to go out of town in few week). CXR 06/06/13 IMPRESSION:  No active cardiopulmonary disease.  Electronically Signed  By: Inez Catalina M.D.  On: 06/06/2013 10:57   03/10/14- 44 yoF never smoker/ + secondhand, followed for COPD with asthma FOLLOWS FOR: Pt c/o increase in SOB, prod cough with clear mucus, wheezing. Pt requesting pred taper. Pt denies CP/tightness.  PFT 03/10/14-   1/416- 59 yoF never smoker/ + secondhand, followed for COPD with asthma FOLLOWS FOR: Little cough since last OV(03/10/14). Pt denies any breathing issues at the current time- no asthma flares. Reports good tolerance with with Singulair.Marland Kitchen  ROS-see HPI Constitutional:   No-   weight loss, night sweats, fevers, chills, fatigue, lassitude. HEENT:   No-  headaches, difficulty swallowing, tooth/dental problems, +sore  throat,       No-  sneezing, itching, ear ache, nasal congestion, post nasal drip,  CV:  No-   chest pain, orthopnea, PND, swelling in lower extremities, anasarca, dizziness, palpitations Resp: +  shortness of breath with exertion or at rest.              +  productive cough,  No non-productive cough,  No- coughing up of blood.              No-   change in color of mucus.  + wheezing.   Skin: No-   rash or lesions. GI:  No-   heartburn, indigestion, abdominal pain, nausea, vomiting,  GU: n. MS:  No-   joint pain or swelling.   Neuro-     nothing unusual Psych:  No- change in mood or affect. No depression or anxiety.  No memory loss.  OBJ- Physical Exam General- Alert, Oriented, Affect-appropriate, Distress- none acute Skin- rash-none, lesions- none, excoriation- none Lymphadenopathy- none Head- atraumatic            Eyes- Gross vision intact, PERRLA, conjunctivae and secretions clear            Ears- Hearing, canals-normal            Nose- Clear, no-Septal dev, mucus, polyps, erosion, perforation. + nasal crease             Throat- Mallampati II , mucosa clear , drainage- none, tonsils- atrophic, own teeth Neck- flexible , trachea midline, no stridor , thyroid nl, carotid no bruit Chest - symmetrical excursion , unlabored           Heart/CV- RRR , no murmur , no gallop  , no rub, nl s1 s2                           - JVD- none , edema- none, stasis changes- none, varices- none           Lung-  Distant, wheeze-none, cough+ light , dullness-none, rub- none           Chest wall-  Abd-  Br/ Gen/ Rectal- Not done, not indicated Extrem- cyanosis- none, clubbing, none, atrophy- none, strength- nl Neuro- grossly intact to observation

## 2014-05-11 NOTE — Patient Instructions (Addendum)
Office spirometry - dx asthma with COPD  Ok to continue present meds  Try to get aerobic/ "cardio" exercise that builds your stamina, so you can do what you want to do

## 2014-06-02 NOTE — Assessment & Plan Note (Signed)
Persistent severe obstructive airways disease. FEV1 has remained fairly stable around 45-48 percent over the last few years. Never shows much response to bronchodilator. There does seem to be an inflammatory component. Plan-at Singulair, prednisone taper, recheck with office spirometry when she is feeling better because most recent PFT was done on "a bad day"

## 2014-07-10 ENCOUNTER — Telehealth: Payer: Self-pay | Admitting: Internal Medicine

## 2014-07-10 MED ORDER — PREDNISONE 10 MG PO TABS: ORAL_TABLET | ORAL | Status: DC

## 2014-07-10 NOTE — Telephone Encounter (Signed)
Patient is wheezing a lot, coughing up clear mucus, has used rescue inhaler 3 times today.  Can hear patient wheezing while talking to her on the phone.  Pt would like prednisone to help open her up.    Current Outpatient Prescriptions on File Prior to Visit  Medication Sig Dispense Refill  . Aclidinium Bromide (TUDORZA PRESSAIR) 400 MCG/ACT AEPB Inhale 1 puff into the lungs 2 (two) times daily. 1 each prn  . albuterol (PROAIR HFA) 108 (90 BASE) MCG/ACT inhaler Inhale 2 puffs into the lungs every 6 (six) hours as needed for wheezing or shortness of breath. 3 Inhaler 1  . albuterol (PROVENTIL) (2.5 MG/3ML) 0.083% nebulizer solution Take 3 mLs (2.5 mg total) by nebulization every 6 (six) hours as needed for wheezing or shortness of breath. 360 mL 3  . fluticasone (FLONASE) 50 MCG/ACT nasal spray Place 2 sprays into both nostrils daily. 48 g 1  . mometasone (NASONEX) 50 MCG/ACT nasal spray Place 2 sprays into the nose daily. 17 g prn  . mometasone-formoterol (DULERA) 200-5 MCG/ACT AERO 2 puffs then rinse mouth, twice daily- maintenance 1 Inhaler prn  . montelukast (SINGULAIR) 10 MG tablet Take 1 tablet (10 mg total) by mouth at bedtime. 90 tablet 3  . Multiple Vitamin (MULTIVITAMIN) capsule Take 1 capsule by mouth daily.     No current facility-administered medications on file prior to visit.    Allergies  Allergen Reactions  . Benadryl [Diphenhydramine Hcl] Hives and Shortness Of Breath

## 2014-07-10 NOTE — Telephone Encounter (Signed)
Offer prednisone 10 mg, # 20, 4 X 2 DAYS, 3 X 2 DAYS, 2 X 2 DAYS, 1 X 2 DAYS  

## 2014-07-10 NOTE — Telephone Encounter (Signed)
Pt aware and RX sent in

## 2014-07-20 ENCOUNTER — Telehealth: Payer: Self-pay | Admitting: Internal Medicine

## 2014-07-20 MED ORDER — PREDNISONE 10 MG PO TABS: ORAL_TABLET | ORAL | Status: DC

## 2014-07-20 NOTE — Telephone Encounter (Signed)
Pt aware of recs. RX sent in. Nothing further needed 

## 2014-07-20 NOTE — Telephone Encounter (Signed)
Patient says she cannot breath, she is wheezing really badly (can hear it on phone), chest is very tight.  Has had to use Rescue inhaler twice today already.  Would like Prednisone called in.  Current Outpatient Prescriptions on File Prior to Visit  Medication Sig Dispense Refill  . Aclidinium Bromide (TUDORZA PRESSAIR) 400 MCG/ACT AEPB Inhale 1 puff into the lungs 2 (two) times daily. 1 each prn  . albuterol (PROAIR HFA) 108 (90 BASE) MCG/ACT inhaler Inhale 2 puffs into the lungs every 6 (six) hours as needed for wheezing or shortness of breath. 3 Inhaler 1  . albuterol (PROVENTIL) (2.5 MG/3ML) 0.083% nebulizer solution Take 3 mLs (2.5 mg total) by nebulization every 6 (six) hours as needed for wheezing or shortness of breath. 360 mL 3  . fluticasone (FLONASE) 50 MCG/ACT nasal spray Place 2 sprays into both nostrils daily. 48 g 1  . mometasone (NASONEX) 50 MCG/ACT nasal spray Place 2 sprays into the nose daily. 17 g prn  . mometasone-formoterol (DULERA) 200-5 MCG/ACT AERO 2 puffs then rinse mouth, twice daily- maintenance 1 Inhaler prn  . montelukast (SINGULAIR) 10 MG tablet Take 1 tablet (10 mg total) by mouth at bedtime. 90 tablet 3  . Multiple Vitamin (MULTIVITAMIN) capsule Take 1 capsule by mouth daily.    . predniSONE (DELTASONE) 10 MG tablet Take 4 tabs daily x 2 days, 3 tabs daily x 2 days, 2 tabs daily x 2 days, 1 tab daily x 2 days 20 tablet 0   No current facility-administered medications on file prior to visit.   Allergies  Allergen Reactions  . Benadryl [Diphenhydramine Hcl] Hives and Shortness Of Breath

## 2014-07-20 NOTE — Telephone Encounter (Signed)
Prednisone 10 mg, # 20, 4 X 2 DAYS, 3 X 2 DAYS, 2 X 2 DAYS, 1 X 2 DAYS  

## 2014-07-21 ENCOUNTER — Other Ambulatory Visit: Payer: Self-pay | Admitting: Internal Medicine

## 2014-07-21 MED ORDER — MONTELUKAST SODIUM 10 MG PO TABS: 10.0000 mg | ORAL_TABLET | Freq: Every day | ORAL | Status: DC

## 2014-07-21 NOTE — Telephone Encounter (Signed)
Refill request for Singulair 10mg  Pt seen 05/2014 with CY Refilled #90 x 3. Nothing further needed.

## 2014-11-02 ENCOUNTER — Ambulatory Visit: Payer: 59 | Admitting: Internal Medicine

## 2014-11-16 ENCOUNTER — Ambulatory Visit: Payer: 59 | Admitting: Internal Medicine

## 2015-02-01 ENCOUNTER — Encounter: Payer: Self-pay | Admitting: Internal Medicine

## 2015-02-01 ENCOUNTER — Ambulatory Visit (INDEPENDENT_AMBULATORY_CARE_PROVIDER_SITE_OTHER): Payer: 59 | Admitting: Internal Medicine

## 2015-02-01 VITALS — BP 110/64 | HR 87 | Ht 61.0 in | Wt 201.8 lb

## 2015-02-01 DIAGNOSIS — J449 Chronic obstructive pulmonary disease, unspecified: Secondary | ICD-10-CM | POA: Diagnosis not present

## 2015-02-01 MED ORDER — MONTELUKAST SODIUM 10 MG PO TABS: 10.0000 mg | ORAL_TABLET | Freq: Every day | ORAL | Status: DC

## 2015-02-01 NOTE — Assessment & Plan Note (Signed)
Dramatic improvement since last here. Hope to avoid viral inflammation this winter. Discussed her travel plans and what meds to take with her. If she remains good, consider another spirometry to reassess.

## 2015-02-01 NOTE — Patient Instructions (Signed)
Flu vax  Refill script sent for singulair  You might want to Google for a small travel nebulizer, like the [employer]ron Microaire U22      Consider for travel: [place], [place], nebules for your nebulizer, cough drops and an otc antihistamine

## 2015-02-01 NOTE — Progress Notes (Signed)
05/23/12- 87 yoF never smoker referred courtesy of Chana Bode, concerned about asthma with cough. She began noticing wheezing dyspnea with exertion during the summer of 2013. She was told in November of 2013 she had asthma. Cold air, strong odors  make her worse and she gets relief from her rescue inhaler. Allergy skin testing by Dr. Ishmael Holter did not lead to allergy vaccine. PFT showed severe obstructive disease. She has been using Dulera 200 and Qvar 80. She uses her rescue inhaler so that she can walk from the parking lot into work and uses it about 3 times per day typically. Cough has required Hycodan. Sputum is mostly clear or trace yellow. She denies past history of pneumonia or any prior lung condition or heart disease. There was a clotting disorder when she was pregnant but she denies VTE. 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 PFT- 04/04/12- FVC 2.71/84%, FEV1 1.20/48%, FEV1/FVC 0.44, no response to bronchodilator, TLC 90%, DLCO 68%. The curve looks like emphysema. Office spirometry 05/23/2012-FVC 2.60/100%, FEV1 1.2/57%, FEV1/FVC 0.48, FEF 25-75% 0.49. CXR 03/28/12 IMPRESSION:  No active lung disease. Question of small granuloma anteriorly on  the lateral view either within the lingula or right middle lobe.  Original Report Authenticated By: Ivar Drape, M.D.   07/10/12- 33 yoF never smoker referred courtesy of Chana Bode, concerned about asthma with cough FOLLOWS FOR: has noticed wheezing, SOB, and cough has returned since last visit; also review 6MW test with patient. Cold air triggers wheeze cough and nasal congestion. Continues Dulera 200 and [place]. PFT 04/04/2012 had demonstrated severe emphysema. 6MWT 07/10/12- 98%, 97%, 97%, 456 m. No oxygen limitation to this exercise. a1AT phenotype 05/23/12- Normal variant M1M2, level 126  04/25/13- 68 yoF never smoker/ + secondhand, followed for COPD with  asthma FOLLOWS FOR: Breathing is unchanged. Still reports SOB, wheezing, coughing and chest tightness. Cough for the past month with wheeze for 2 or 3 weeks. Caprice Renshaw helped. Ran out of medicines 3 weeks ago. Itching and sneezing. No fever or green. Allergy skin test positive only for house dust in the past. Office Spirometry 04/25/2013-very severe obstruction. FVC 1.40/54%, FEV1 0.64/29%, FEV1/FEC 0.46, FEF 25-75% 0.26/9%. CXR 03/19/12 IMPRESSION:  No active lung disease. Question of small granuloma anteriorly on  the lateral view either within the lingula or right middle lobe.  Original Report Authenticated By: Ivar Drape, M.D.  06/06/13- 48 yoF never smoker/ + secondhand, followed for severe COPD with asthma FOLLOWS FOR: cough-productive slightly-clear; wheezing at times. Using [facility] and occasional rescue inhaler. Feels controlled most of the time.  12/05/13- 77 yoF never smoker/ + secondhand, followed for COPD with asthma FOLLOWS FOR: Has noticed she has started wheezing and coughing recently-feels like she is getting sick. Pt would like to have Rx for Prednisone on hand with refills(she is set to go out of town in few week). CXR 06/06/13 IMPRESSION:  No active cardiopulmonary disease.  Electronically Signed  By: Inez Catalina M.D.  On: 06/06/2013 10:57   03/10/14- 50 yoF never smoker/ + secondhand, followed for COPD with asthma FOLLOWS FOR: Pt c/o increase in SOB, prod cough with clear mucus, wheezing. Pt requesting pred taper. Pt denies CP/tightness.  PFT 03/10/14- severe obstruction, insignif resp to BD. FVC 2.22/ 84%, FEV1 0.95/ 44%, FEV1/FVC 0.43, FEF25-75% 0.41/ 17%, TLC 103%, DLCO 109% "Bad day"   05/11/14- 73 yoF never smoker/ + secondhand, followed for COPD with asthma FOLLOWS FOR: Little  cough since last OV(03/10/14). Pt denies any breathing issues at the current time- no asthma flares. Reports good tolerance with with Singulair..   Office Spirometry 05/11/14- severe obstructive  disease. FVC 2.42/ 92%, FEV1 1.09/ 50%, FEV1/FVC 0.45, FEF25-75% 0.47 We encouraged exercise for stamina  02/01/15- 32 yoF never smoker/ + secondhand, followed for COPD with asthma FOLLOWS FOR Little cough since last OV. No asthma flares since last visit. Pt states breathing is much better since last visit  She reports dramatic improvement since LOV, possibly due to singulair. Off all inhalers and prednisone. No rescue inhaler in 6 weeks. No sleep disturbance. Going to [place] and [place]- discussed travel needs.  ROS-see HPI Constitutional:   No-   weight loss, night sweats, fevers, chills, fatigue, lassitude. HEENT:   No-  headaches, difficulty swallowing, tooth/dental problems, +sore throat,       No-  sneezing, itching, ear ache, nasal congestion, post nasal drip,  CV:  No-   chest pain, orthopnea, PND, swelling in lower extremities, anasarca, dizziness, palpitations Resp: +  shortness of breath with exertion or at rest.               productive cough,  No non-productive cough,  No- coughing up of blood.              No-   change in color of mucus.  + wheezing.   Skin: No-   rash or lesions. GI:  No-   heartburn, indigestion, abdominal pain, nausea, vomiting,  GU: n. MS:  No-   joint pain or swelling.   Neuro-     nothing unusual Psych:  No- change in mood or affect. No depression or anxiety.  No memory loss.  OBJ- Physical Exam General- Alert, Oriented, Affect-appropriate, Distress- none acute Skin- rash-none, lesions- none, excoriation- none Lymphadenopathy- none Head- atraumatic            Eyes- Gross vision intact, PERRLA, conjunctivae and secretions clear            Ears- Hearing, canals-normal            Nose- Clear, no-Septal dev, mucus, polyps, erosion, perforation. + nasal crease             Throat- Mallampati II , mucosa clear , drainage- none, tonsils- atrophic, own teeth Neck- flexible , trachea midline, no stridor , thyroid nl, carotid no bruit Chest - symmetrical  excursion , unlabored           Heart/CV- RRR , no murmur , no gallop  , no rub, nl s1 s2                           - JVD- none , edema- none, stasis changes- none, varices- none           Lung-  clear, wheeze-none, cough-none, dullness-none, rub- none           Chest wall-  Abd-  Br/ Gen/ Rectal- Not done, not indicated Extrem- cyanosis- none, clubbing, none, atrophy- none, strength- nl Neuro- grossly intact to observation

## 2015-03-18 ENCOUNTER — Telehealth: Payer: Self-pay | Admitting: Internal Medicine

## 2015-03-18 MED ORDER — PREDNISONE 10 MG PO TABS: ORAL_TABLET | ORAL | Status: DC

## 2015-03-18 NOTE — Telephone Encounter (Signed)
Offer prednisone 10 mg, # 20, 4 X 2 DAYS, 3 X 2 DAYS, 2 X 2 DAYS, 1 X 2 DAYS  

## 2015-03-18 NOTE — Telephone Encounter (Signed)
Spoke with patient aware that rx being sent to pharmacy. Nothing further needed.

## 2015-03-18 NOTE — Telephone Encounter (Signed)
Called spoke with pt. She c/o increase SOB, wheezing, prod cough (yelliow phlem), runny nose x 2 days. Requesting prednisone. Please advise Dr. Annamaria Boots thanks  Allergies  Allergen Reactions  . Benadryl [Diphenhydramine Hcl] Hives and Shortness Of Breath     Current Outpatient Prescriptions on File Prior to Visit  Medication Sig Dispense Refill  . Aclidinium Bromide (TUDORZA PRESSAIR) 400 MCG/ACT AEPB Inhale 1 puff into the lungs 2 (two) times daily. 1 each prn  . albuterol (PROAIR HFA) 108 (90 BASE) MCG/ACT inhaler Inhale 2 puffs into the lungs every 6 (six) hours as needed for wheezing or shortness of breath. 3 Inhaler 1  . albuterol (PROVENTIL) (2.5 MG/3ML) 0.083% nebulizer solution Take 3 mLs (2.5 mg total) by nebulization every 6 (six) hours as needed for wheezing or shortness of breath. 360 mL 3  . fluticasone (FLONASE) 50 MCG/ACT nasal spray Place 2 sprays into both nostrils daily. 48 g 1  . mometasone (NASONEX) 50 MCG/ACT nasal spray Place 2 sprays into the nose daily. (Patient not taking: Reported on 02/01/2015) 17 g prn  . mometasone-formoterol (DULERA) 200-5 MCG/ACT AERO 2 puffs then rinse mouth, twice daily- maintenance 1 Inhaler prn  . montelukast (SINGULAIR) 10 MG tablet Take 1 tablet (10 mg total) by mouth at bedtime. 90 tablet 3  . Multiple Vitamin (MULTIVITAMIN) capsule Take 1 capsule by mouth daily.    . predniSONE (DELTASONE) 10 MG tablet Take 4 tabs daily x 2 days, 3 tabs daily x 2 days, 2 tabs daily x 2 days, 1 tab daily x 2 days (Patient not taking: Reported on 02/01/2015) 20 tablet 0   No current facility-administered medications on file prior to visit.

## 2015-04-13 ENCOUNTER — Other Ambulatory Visit: Payer: Self-pay | Admitting: Internal Medicine

## 2015-04-19 ENCOUNTER — Telehealth: Payer: Self-pay | Admitting: Internal Medicine

## 2015-04-19 MED ORDER — PREDNISONE 10 MG PO TABS: ORAL_TABLET | ORAL | Status: DC

## 2015-04-19 NOTE — Telephone Encounter (Signed)
Ok prednisone 10 mg, # 20, 4 X 2 DAYS, 3 X 2 DAYS, 2 X 2 DAYS, 1 X 2 DAYS  

## 2015-04-19 NOTE — Telephone Encounter (Signed)
Spoke with pt and advised that rx for Prednisone was sent to pharmacy.

## 2015-04-19 NOTE — Telephone Encounter (Signed)
Spoke with pt. States that she needs a prednisone taper sent in. Reports SOB, coughing with production with clear mucus, chest tightness and wheezing. Denies fever. Onset was 4 days ago. Has been using cough drops and OTC cough meds with no relief.  Allergies  Allergen Reactions  . Benadryl [Diphenhydramine Hcl] Hives and Shortness Of Breath   Current Outpatient Prescriptions on File Prior to Visit  Medication Sig Dispense Refill  . Aclidinium Bromide (TUDORZA PRESSAIR) 400 MCG/ACT AEPB Inhale 1 puff into the lungs 2 (two) times daily. 1 each prn  . albuterol (PROAIR HFA) 108 (90 BASE) MCG/ACT inhaler Inhale 2 puffs into the lungs every 6 (six) hours as needed for wheezing or shortness of breath. 3 Inhaler 1  . albuterol (PROVENTIL) (2.5 MG/3ML) 0.083% nebulizer solution INHALE 1 VIAL BY NEBULIZATION EVERY 6 HOURS AS NEEDED FOR WHEEZING OR SHORTNESS OF BREATH 300 mL 0  . fluticasone (FLONASE) 50 MCG/ACT nasal spray Place 2 sprays into both nostrils daily. 48 g 1  . mometasone (NASONEX) 50 MCG/ACT nasal spray Place 2 sprays into the nose daily. (Patient not taking: Reported on 02/01/2015) 17 g prn  . mometasone-formoterol (DULERA) 200-5 MCG/ACT AERO 2 puffs then rinse mouth, twice daily- maintenance 1 Inhaler prn  . montelukast (SINGULAIR) 10 MG tablet Take 1 tablet (10 mg total) by mouth at bedtime. 90 tablet 3  . Multiple Vitamin (MULTIVITAMIN) capsule Take 1 capsule by mouth daily.    . predniSONE (DELTASONE) 10 MG tablet Take 4 tabs daily x 2 days, 3 tabs daily x 2 days, 2 tabs daily x 2 days, 1 tab daily x 2 days (Patient not taking: Reported on 02/01/2015) 20 tablet 0  . predniSONE (DELTASONE) 10 MG tablet Take 4 tabs po x 2 days, then 3 x 2 days, then 2 x 2 days, then 1 x 2 days then stop. 20 tablet 0   No current facility-administered medications on file prior to visit.    CY - please advise. Thanks.

## 2015-04-30 ENCOUNTER — Telehealth: Payer: Self-pay | Admitting: Internal Medicine

## 2015-04-30 MED ORDER — PREDNISONE 10 MG PO TABS: 10.0000 mg | ORAL_TABLET | Freq: Every day | ORAL | Status: DC

## 2015-04-30 MED ORDER — AMOXICILLIN-POT CLAVULANATE 875-125 MG PO TABS
1.0000 | ORAL_TABLET | Freq: Two times a day (BID) | ORAL | Status: DC
Start: 2015-04-30 — End: 2015-08-02

## 2015-04-30 NOTE — Telephone Encounter (Signed)
Pt is aware of CY's recommendation. Rx's have been sent in. OV has been scheduled for 05/31/15 at 11:15am. Nothing further was needed.

## 2015-04-30 NOTE — Telephone Encounter (Signed)
Spoke with pt. Reports that she is not feeling any better from when she called in on 04/19/15. Still having SOB, wheezing and coughing with production of yellow mucus. Denies fever or chest tightness. Finished pred taper that was given on 04/19/15. She is requesting another round of prednisone or possible an antibiotic. CY - please advise. Thanks.  Allergies  Allergen Reactions  . Benadryl [Diphenhydramine Hcl] Hives and Shortness Of Breath   Current Outpatient Prescriptions on File Prior to Visit  Medication Sig Dispense Refill  . Aclidinium Bromide (TUDORZA PRESSAIR) 400 MCG/ACT AEPB Inhale 1 puff into the lungs 2 (two) times daily. 1 each prn  . albuterol (PROAIR HFA) 108 (90 BASE) MCG/ACT inhaler Inhale 2 puffs into the lungs every 6 (six) hours as needed for wheezing or shortness of breath. 3 Inhaler 1  . albuterol (PROVENTIL) (2.5 MG/3ML) 0.083% nebulizer solution INHALE 1 VIAL BY NEBULIZATION EVERY 6 HOURS AS NEEDED FOR WHEEZING OR SHORTNESS OF BREATH 300 mL 0  . fluticasone (FLONASE) 50 MCG/ACT nasal spray Place 2 sprays into both nostrils daily. 48 g 1  . mometasone (NASONEX) 50 MCG/ACT nasal spray Place 2 sprays into the nose daily. (Patient not taking: Reported on 02/01/2015) 17 g prn  . mometasone-formoterol (DULERA) 200-5 MCG/ACT AERO 2 puffs then rinse mouth, twice daily- maintenance 1 Inhaler prn  . montelukast (SINGULAIR) 10 MG tablet Take 1 tablet (10 mg total) by mouth at bedtime. 90 tablet 3  . Multiple Vitamin (MULTIVITAMIN) capsule Take 1 capsule by mouth daily.    . predniSONE (DELTASONE) 10 MG tablet Take 4 tabs po x 2 days, then 3 x 2 days, then 2 x 2 days, then 1 x 2 days then stop. 20 tablet 0   No current facility-administered medications on file prior to visit.

## 2015-04-30 NOTE — Telephone Encounter (Signed)
Ok prednisone 10 mg, # 15, 1 daily       augmentin 875, # 20, 1 twice daily  Please get her a return ov with me in the next month- ok to use a work-in spot.

## 2015-05-31 ENCOUNTER — Ambulatory Visit: Payer: 59 | Admitting: Internal Medicine

## 2015-07-21 ENCOUNTER — Telehealth: Payer: Self-pay | Admitting: Internal Medicine

## 2015-07-21 MED ORDER — PREDNISONE 10 MG PO TABS: ORAL_TABLET | ORAL | Status: DC

## 2015-07-21 NOTE — Telephone Encounter (Signed)
Spoke with pt. She is aware of CY's recommendation. Rx was sent in. Nothing further was needed.

## 2015-07-21 NOTE — Telephone Encounter (Signed)
Offer prednisone 10 mg, # 20, 4 X 2 DAYS, 3 X 2 DAYS, 2 X 2 DAYS, 1 X 2 DAYS  

## 2015-07-21 NOTE — Telephone Encounter (Signed)
Pt calling complaining of wheezing, chest tx, dry cough, PND, nasal cong, increase SOB x Saturday. Requesting prednisone. Please advise Dr. Annamaria Boots thanks

## 2015-08-02 ENCOUNTER — Ambulatory Visit (INDEPENDENT_AMBULATORY_CARE_PROVIDER_SITE_OTHER): Payer: 59 | Admitting: Internal Medicine

## 2015-08-02 ENCOUNTER — Encounter: Payer: Self-pay | Admitting: Internal Medicine

## 2015-08-02 VITALS — BP 112/66 | HR 99 | Ht 61.0 in | Wt 196.8 lb

## 2015-08-02 DIAGNOSIS — J45909 Unspecified asthma, uncomplicated: Secondary | ICD-10-CM

## 2015-08-02 DIAGNOSIS — J449 Chronic obstructive pulmonary disease, unspecified: Secondary | ICD-10-CM | POA: Diagnosis not present

## 2015-08-02 MED ORDER — LEVALBUTEROL HCL 0.63 MG/3ML IN NEBU
0.6300 mg | INHALATION_SOLUTION | Freq: Once | RESPIRATORY_TRACT | Status: AC
  Administered 2015-08-02: 0.63 mg via RESPIRATORY_TRACT

## 2015-08-02 MED ORDER — FLUTICASONE FUROATE-VILANTEROL 200-25 MCG/INH IN AEPB: 1.0000 | INHALATION_SPRAY | Freq: Every day | RESPIRATORY_TRACT | Status: DC

## 2015-08-02 NOTE — Progress Notes (Signed)
05/23/12- 50 yoF never smoker referred courtesy of Chana Bode, concerned about asthma with cough. She began noticing wheezing dyspnea with exertion during the summer of 2013. She was told in November of 2013 she had asthma. Cold air, strong odors  make her worse and she gets relief from her rescue inhaler. Allergy skin testing by Dr. Ishmael Holter did not lead to allergy vaccine. PFT showed severe obstructive disease. She has been using Dulera 200 and Qvar 80. She uses her rescue inhaler so that she can walk from the parking lot into work and uses it about 3 times per day typically. Cough has required Hycodan. Sputum is mostly clear or trace yellow. She denies past history of pneumonia or any prior lung condition or heart disease. There was a clotting disorder when she was pregnant but she denies VTE. 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 PFT- 04/04/12- FVC 2.71/84%, FEV1 1.20/48%, FEV1/FVC 0.44, no response to bronchodilator, TLC 90%, DLCO 68%. The curve looks like emphysema. Office spirometry 05/23/2012-FVC 2.60/100%, FEV1 1.2/57%, FEV1/FVC 0.48, FEF 25-75% 0.49. CXR 03/28/12 IMPRESSION:  No active lung disease. Question of small granuloma anteriorly on  the lateral view either within the lingula or right middle lobe.  Original Report Authenticated By: Ivar Drape, M.D.   07/10/12- 50 yoF never smoker referred courtesy of Chana Bode, concerned about asthma with cough FOLLOWS FOR: has noticed wheezing, SOB, and cough has returned since last visit; also review 6MW test with patient. Cold air triggers wheeze cough and nasal congestion. Continues Dulera 200 and [place]. PFT 04/04/2012 had demonstrated severe emphysema. 6MWT 07/10/12- 98%, 97%, 97%, 456 m. No oxygen limitation to this exercise. a1AT phenotype 05/23/12- Normal variant M1M2, level 126  04/25/13- 50 yoF never smoker/ + secondhand, followed for COPD with  asthma FOLLOWS FOR: Breathing is unchanged. Still reports SOB, wheezing, coughing and chest tightness. Cough for the past month with wheeze for 2 or 3 weeks. Caprice Renshaw helped. Ran out of medicines 3 weeks ago. Itching and sneezing. No fever or green. Allergy skin test positive only for house dust in the past. Office Spirometry 04/25/2013-very severe obstruction. FVC 1.40/54%, FEV1 0.64/29%, FEV1/FEC 0.46, FEF 25-75% 0.26/9%. CXR 03/19/12 IMPRESSION:  No active lung disease. Question of small granuloma anteriorly on  the lateral view either within the lingula or right middle lobe.  Original Report Authenticated By: Ivar Drape, M.D.  06/06/13- 50 yoF never smoker/ + secondhand, followed for severe COPD with asthma FOLLOWS FOR: cough-productive slightly-clear; wheezing at times. Using [facility] and occasional rescue inhaler. Feels controlled most of the time.  12/05/13- 50 yoF never smoker/ + secondhand, followed for COPD with asthma FOLLOWS FOR: Has noticed she has started wheezing and coughing recently-feels like she is getting sick. Pt would like to have Rx for Prednisone on hand with refills(she is set to go out of town in few week). CXR 06/06/13 IMPRESSION:  No active cardiopulmonary disease.  Electronically Signed  By: Inez Catalina M.D.  On: 06/06/2013 10:57   03/10/14- 50 yoF never smoker/ + secondhand, followed for COPD with asthma FOLLOWS FOR: Pt c/o increase in SOB, prod cough with clear mucus, wheezing. Pt requesting pred taper. Pt denies CP/tightness.  PFT 03/10/14- severe obstruction, insignif resp to BD. FVC 2.22/ 84%, FEV1 0.95/ 44%, FEV1/FVC 0.43, FEF25-75% 0.41/ 17%, TLC 103%, DLCO 109% "Bad day"   05/11/14- 50 yoF never smoker/ + secondhand, followed for COPD with asthma FOLLOWS FOR: Little  cough since last OV(03/10/14). Pt denies any breathing issues at the current time- no asthma flares. Reports good tolerance with with Singulair..   Office Spirometry 05/11/14- severe obstructive  disease. FVC 2.42/ 92%, FEV1 1.09/ 50%, FEV1/FVC 0.45, FEF25-75% 0.47 We encouraged exercise for stamina  02/01/15- 50 yoF never smoker/ + secondhand, followed for COPD with asthma FOLLOWS FOR Little cough since last OV. No asthma flares since last visit. Pt states breathing is much better since last visit  She reports dramatic improvement since LOV, possibly due to singulair. Off all inhalers and prednisone. No rescue inhaler in 6 weeks. No sleep disturbance. Going to [place] and [place]- discussed travel needs.  08/02/2015-50 year old female never smoker/ + second-hand, followed for COPD with asthma FOLLOWS FOR: Pt states she is having wheezing at this time with weather and seasonal changes Using rescue inhaler about once a day routinely now. Couldn't afford her other inhalers when she went to get them all at once. Nebulizer with albuterol makes her nervous/jittery. Not acutely ill Office Spirometry-08/02/2015-severe obstructive airways disease.(Done after Xopenex nebulizer treatment) FVC 2.58/102%, FEV1 1.00/47%, FEV1/FVC 0.39, 25-75 0.41/14%  ROS-see HPI Constitutional:   No-   weight loss, night sweats, fevers, chills, fatigue, lassitude. HEENT:   No-  headaches, difficulty swallowing, tooth/dental problems, +sore throat,       No-  sneezing, itching, ear ache, nasal congestion, post nasal drip,  CV:  No-   chest pain, orthopnea, PND, swelling in lower extremities, anasarca, dizziness, palpitations Resp: +  shortness of breath with exertion or at rest.               productive cough,  No non-productive cough,  No- coughing up of blood.              No-   change in color of mucus.  + wheezing.   Skin: No-   rash or lesions. GI:  No-   heartburn, indigestion, abdominal pain, nausea, vomiting,  GU: n. MS:  No-   joint pain or swelling.   Neuro-     nothing unusual Psych:  No- change in mood or affect. No depression or anxiety.  No memory loss.  OBJ- Physical Exam General- Alert,  Oriented, Affect-appropriate, Distress- none acute Skin- rash-none, lesions- none, excoriation- none Lymphadenopathy- none Head- atraumatic            Eyes- Gross vision intact, PERRLA, conjunctivae and secretions clear            Ears- Hearing, canals-normal            Nose- Clear, no-Septal dev, mucus, polyps, erosion, perforation. + nasal crease             Throat- Mallampati II , mucosa clear , drainage- none, tonsils- atrophic, own teeth Neck- flexible , trachea midline, no stridor , thyroid nl, carotid no bruit Chest - symmetrical excursion , unlabored           Heart/CV- RRR , no murmur , no gallop  , no rub, nl s1 s2                           - JVD- none , edema- none, stasis changes- none, varices- none           Lung-  clear, wheeze-none, cough-none, dullness-none, rub- none           Chest wall-  Abd-  Br/ Gen/ Rectal- Not done, not indicated Extrem- cyanosis- none,  clubbing, none, atrophy- none, strength- nl Neuro- grossly intact to observation

## 2015-08-02 NOTE — Patient Instructions (Addendum)
Order neb xop 0.63   Order- office spirometry     Dx  COPD with asthma  Sample and Script Breo 200,    Inhale 1 puff then rinse mouth, once daily     Maintenance medicine instead of Dulera  You can check with your insurance formulary to see what medicine is preferred in the category of Breo and Dulera.  Please let me know if these meds won't work for you, so I can try to help

## 2015-08-03 NOTE — Assessment & Plan Note (Signed)
Stable severe obstructive pattern. Loop contour suggests emphysema. Alpha-1 antitrypsin level and phenotype were normal. Chest x-ray looks normal. Most likely this is a chronic fixed asthma, lacking exposure history or other explanation. Unknown if Jocelyn Myers might of had a severe bronchiolitis as an infant. Plan-try sample Breo 200 inhaler to see if Jocelyn Myers notices any effect. Note the PFT today was after Xopenex neb treatment.

## 2016-02-02 ENCOUNTER — Ambulatory Visit: Payer: 59 | Admitting: Internal Medicine

## 2016-02-24 ENCOUNTER — Ambulatory Visit (INDEPENDENT_AMBULATORY_CARE_PROVIDER_SITE_OTHER): Payer: 59 | Admitting: Internal Medicine

## 2016-02-24 ENCOUNTER — Encounter: Payer: Self-pay | Admitting: Internal Medicine

## 2016-02-24 DIAGNOSIS — Z23 Encounter for immunization: Secondary | ICD-10-CM

## 2016-02-24 DIAGNOSIS — J449 Chronic obstructive pulmonary disease, unspecified: Secondary | ICD-10-CM | POA: Diagnosis not present

## 2016-02-24 DIAGNOSIS — E669 Obesity, unspecified: Secondary | ICD-10-CM

## 2016-02-24 MED ORDER — GLYCOPYRROLATE-FORMOTEROL 9-4.8 MCG/ACT IN AERO: 2.0000 | INHALATION_SPRAY | Freq: Two times a day (BID) | RESPIRATORY_TRACT | 0 refills | Status: DC

## 2016-02-24 NOTE — Patient Instructions (Signed)
Flu vax   Sample x 2 Bevespi maintenance inhaler    Inhale 2 puffs, twice daily     Try this instead of Breo. If you like it better, and think it helps, then let us know and we can get a prescription.  Suggest you stop Breo and see how you do for about a week without it. THEN try the Bevespi sample

## 2016-02-24 NOTE — Progress Notes (Signed)
02/01/15- 33 yoF never smoker/ + secondhand, followed for COPD with asthma FOLLOWS FOR Little cough since last OV. No asthma flares since last visit. Pt states breathing is much better since last visit  She reports dramatic improvement since LOV, possibly due to singulair. Off all inhalers and prednisone. No rescue inhaler in 6 weeks. No sleep disturbance. Going to [place] and [place]- discussed travel needs.  08/02/2015-50 year old female never smoker/ + second-hand, followed for COPD with asthma FOLLOWS FOR: Pt states she is having wheezing at this time with weather and seasonal changes Using rescue inhaler about once a day routinely now. Couldn't afford her other inhalers when she went to get them all at once. Nebulizer with albuterol makes her nervous/jittery. Not acutely ill Office Spirometry-08/02/2015-severe obstructive airways disease.(Done after Xopenex nebulizer treatment) FVC 2.58/102%, FEV1 1.00/47%, FEV1/FVC 0.39, 25-75 0.41/14%  02/24/2016-50 year old female never smoker/+ secondhand, followed for COPD with asthma FOLLOWS FOR: Pt. her breathing is doing ok, some wheezing, only using her inhaler when she needs it Pt denies sob, chest pain, or tightness,  a1AT WNL No acute exacerbation, little cough or wheeze. No change in dyspnea on exertion-chronic. Infrequent need for rescue inhaler or nebulizer.  ROS-see HPI Constitutional:   No-   weight loss, night sweats, fevers, chills, fatigue, lassitude. HEENT:   No-  headaches, difficulty swallowing, tooth/dental problems, +sore throat,       No-  sneezing, itching, ear ache, nasal congestion, post nasal drip,  CV:  No-   chest pain, orthopnea, PND, swelling in lower extremities, anasarca, dizziness, palpitations Resp: +  shortness of breath with exertion or at rest.               productive cough,  No non-productive cough,  No- coughing up of blood.              No-   change in color of mucus.  + wheezing.   Skin: No-   rash or  lesions. GI:  No-   heartburn, indigestion, abdominal pain, nausea, vomiting,  GU: n. MS:  No-   joint pain or swelling.   Neuro-     nothing unusual Psych:  No- change in mood or affect. No depression or anxiety.  No memory loss.  OBJ- Physical Exam General- Alert, Oriented, Affect-appropriate, Distress- none acute, + overweight Skin- rash-none, lesions- none, excoriation- none Lymphadenopathy- none Head- atraumatic            Eyes- Gross vision intact, PERRLA, conjunctivae and secretions clear            Ears- Hearing, canals-normal            Nose- Clear, no-Septal dev, mucus, polyps, erosion, perforation. + nasal crease             Throat- Mallampati II , mucosa clear , drainage- none, tonsils- atrophic, own teeth Neck- flexible , trachea midline, no stridor , thyroid nl, carotid no bruit Chest - symmetrical excursion , unlabored           Heart/CV- RRR , no murmur , no gallop  , no rub, nl s1 s2                           - JVD- none , edema- none, stasis changes- none, varices- none           Lung-  clear, wheeze-none, cough-none, dullness-none, rub- none  Chest wall-  Abd-  Br/ Gen/ Rectal- Not done, not indicated Extrem- cyanosis- none, clubbing, none, atrophy- none, strength- nl Neuro- grossly intact to observation

## 2016-02-29 NOTE — Assessment & Plan Note (Signed)
Chronic fixed obstructive asthma with uncertain effect of remote secondhand smoke exposure. Clinically stable. Plan-discussed medications. Would like her to try LABA? LAMA- Bevespi. Flu vax

## 2016-02-29 NOTE — Assessment & Plan Note (Signed)
Weight loss would significantly help her dyspnea on exertion. Consider bariatric referral.

## 2016-04-28 ENCOUNTER — Telehealth: Payer: Self-pay | Admitting: Internal Medicine

## 2016-04-28 MED ORDER — PREDNISONE 10 MG PO TABS
ORAL_TABLET | ORAL | 0 refills | Status: DC
Start: 2016-04-28 — End: 2016-05-09

## 2016-04-28 NOTE — Telephone Encounter (Signed)
Prednisone 10 mg take  4 each am x 2 days,   2 each am x 2 days,  1 each am x 2 days and stop  

## 2016-04-28 NOTE — Telephone Encounter (Signed)
Pt aware of MW recommendations and voiced her understanding. Rx sent to preferred pharmacy. nothing further needed.

## 2016-04-28 NOTE — Telephone Encounter (Signed)
Called and spoke with pt and she stated that CY normally gives her prednisone to help with the cough when this happens.    Pt stated that she has a cough with yellow sputum.  Cough x 2 weeks--she stated that the wheezing started last night.  Pt is aware that CY is out of the office and we will send to MW to advise.  Thanks  Allergies  Allergen Reactions  . Benadryl [Diphenhydramine Hcl] Hives and Shortness Of Breath

## 2016-05-05 ENCOUNTER — Telehealth: Payer: Self-pay | Admitting: Internal Medicine

## 2016-05-05 MED ORDER — GLYCOPYRROLATE-FORMOTEROL 9-4.8 MCG/ACT IN AERO: 2.0000 | INHALATION_SPRAY | Freq: Two times a day (BID) | RESPIRATORY_TRACT | 5 refills | Status: DC

## 2016-05-05 NOTE — Telephone Encounter (Signed)
Pt needs rx for bevespi sent to pharmacy.  This has been sent.  Nothing further needed.

## 2016-05-09 ENCOUNTER — Telehealth: Payer: Self-pay | Admitting: Internal Medicine

## 2016-05-09 MED ORDER — PREDNISONE 10 MG PO TABS: ORAL_TABLET | ORAL | 0 refills | Status: DC

## 2016-05-09 MED ORDER — BENZONATATE 200 MG PO CAPS: 200.0000 mg | ORAL_CAPSULE | Freq: Three times a day (TID) | ORAL | 0 refills | Status: DC | PRN

## 2016-05-09 NOTE — Telephone Encounter (Signed)
Called and spoke with pt and she is aware of CY recs.  She stated that the delsym does not work for her, she has tried this in the past.  Nothing further is needed.

## 2016-05-09 NOTE — Telephone Encounter (Signed)
Offer prednisone 10 mg, # 20, 4 X 2 DAYS, 3 X 2 DAYS, 2 X 2 DAYS, 1 X 2 DAYS            Tessalon perles 200 mg, # 30, 1 every 8 hours if needed for cough      Or could use Delsym otc cough syrup

## 2016-05-09 NOTE — Telephone Encounter (Signed)
CY  Please Advise- Sick Message  Pt. C/o coughing with yellow mucus,wheezing,chest tightness, has done a breathing treatment, increase sob,Denies fever. She would like to know if there is something you can recommend for her cough as well if she could have prednisone called in.  Allergies  Allergen Reactions  . Benadryl [Diphenhydramine Hcl] Hives and Shortness Of Breath

## 2016-05-22 ENCOUNTER — Telehealth: Payer: Self-pay | Admitting: Internal Medicine

## 2016-05-22 MED ORDER — PREDNISONE 10 MG PO TABS: ORAL_TABLET | ORAL | 0 refills | Status: DC

## 2016-05-22 NOTE — Telephone Encounter (Signed)
Ok prednisone 10 mg, # 20, 4 X 2 DAYS, 3 X 2 DAYS, 2 X 2 DAYS, 1 X 2 DAYS  

## 2016-05-22 NOTE — Telephone Encounter (Signed)
Rx sent to preferred pharmacy. Pt aware and voiced her understanding. Nothing further needed.  

## 2016-05-22 NOTE — Telephone Encounter (Signed)
CY  Please Advise- Sick Message  Pt. Called in today c/o of having increase sob,wheezing, chest tightness,coughing with yellow mucus.She states she is using her rescue inhaler more. Denies fever. She is requesting prednisone.   Allergies  Allergen Reactions  . Benadryl [Diphenhydramine Hcl] Hives and Shortness Of Breath

## 2016-05-26 ENCOUNTER — Telehealth: Payer: Self-pay | Admitting: Internal Medicine

## 2016-05-26 MED ORDER — PREDNISONE 10 MG PO TABS: 20.0000 mg | ORAL_TABLET | Freq: Every day | ORAL | 0 refills | Status: DC

## 2016-05-26 MED ORDER — AZITHROMYCIN 250 MG PO TABS: ORAL_TABLET | ORAL | 0 refills | Status: AC

## 2016-05-26 NOTE — Telephone Encounter (Signed)
Spoke with pt. She is aware of CY's recommendation. Rxs have been sent in. Nothing further was needed. 

## 2016-05-26 NOTE — Telephone Encounter (Signed)
Offer Zpak  Extend prednisone :  20 mg, # 4, 1 daily x 4 days  Ok to use otc like Tylenol Cold and Flu for symptom help if needed  She can give herself nebulizer treatment every 6 hours if needed  Stay well hydrated

## 2016-05-26 NOTE — Telephone Encounter (Signed)
CY  Please Advise-Sick Message    Pt. Is called in c/o Wheezing, increase sob harder to catch her breath, congestion, feels like her chest is even more tight, fever off and on, coughing up yellowish green mucus, gave herself a breathing treatment but no relief, finished the prednisone that she was placed on 05/22/16. She wants to know what do you suggest is her next step since she is still not better.   Allergies  Allergen Reactions  . Benadryl [Diphenhydramine Hcl] Hives and Shortness Of Breath

## 2016-06-01 ENCOUNTER — Telehealth: Payer: Self-pay | Admitting: Internal Medicine

## 2016-06-01 MED ORDER — PREDNISONE 10 MG PO TABS: 20.0000 mg | ORAL_TABLET | Freq: Every day | ORAL | 0 refills | Status: DC

## 2016-06-01 MED ORDER — AZITHROMYCIN 250 MG PO TABS: ORAL_TABLET | ORAL | 0 refills | Status: DC

## 2016-06-01 NOTE — Telephone Encounter (Signed)
[phone] pt calling back

## 2016-06-01 NOTE — Telephone Encounter (Signed)
Spoke with the pt and notified of recs per CDY  She verbalized understanding and rxs were sebt

## 2016-06-01 NOTE — Telephone Encounter (Signed)
Spoke with pt, who states she finished her abx on Tuesday and prednisone on Monday. Pt is requesting another round abx and prednisone. Pt states she has noticed some improvement with the chest congestion, however she still has a prod cough with light yellow mucus, wheezing, chest discomfort X 27mo Pt denies any fever, chills or sweats.   CY please advise. Thanks.   Current Outpatient Prescriptions on File Prior to Visit  Medication Sig Dispense Refill  . albuterol (PROAIR HFA) 108 (90 BASE) MCG/ACT inhaler Inhale 2 puffs into the lungs every 6 (six) hours as needed for wheezing or shortness of breath. 3 Inhaler 1  . albuterol (PROVENTIL) (2.5 MG/3ML) 0.083% nebulizer solution INHALE 1 VIAL BY NEBULIZATION EVERY 6 HOURS AS NEEDED FOR WHEEZING OR SHORTNESS OF BREATH 300 mL 0  . benzonatate (TESSALON) 200 MG capsule Take 1 capsule (200 mg total) by mouth 3 (three) times daily as needed for cough. 30 capsule 0  . fluticasone furoate-vilanterol (BREO ELLIPTA) 200-25 MCG/INH AEPB Inhale 1 puff into the lungs daily. 60 each 12  . Glycopyrrolate-Formoterol (BEVESPI AEROSPHERE) 9-4.8 MCG/ACT AERO Inhale 2 puffs into the lungs 2 (two) times daily. 1 Inhaler 5  . montelukast (SINGULAIR) 10 MG tablet Take 1 tablet (10 mg total) by mouth at bedtime. 90 tablet 3  . Multiple Vitamin (MULTIVITAMIN) capsule Take 1 capsule by mouth daily.    Marland Kitchen OVER THE COUNTER MEDICATION Allergy Nasal spray OTC-pt unsure of name    . predniSONE (DELTASONE) 10 MG tablet Take 2 tablets (20 mg total) by mouth daily with breakfast. 8 tablet 0   No current facility-administered medications on file prior to visit.     Allergies  Allergen Reactions  . Benadryl [Diphenhydramine Hcl] Hives and Shortness Of Breath

## 2016-06-01 NOTE — Telephone Encounter (Signed)
Ok to refill what we just gave her

## 2016-06-01 NOTE — Telephone Encounter (Signed)
I have called and lmomtcb x 1 for the pt.  Will send in these meds once the pt calls back.

## 2016-06-15 ENCOUNTER — Telehealth: Payer: Self-pay | Admitting: Internal Medicine

## 2016-06-15 NOTE — Telephone Encounter (Signed)
Spoke with pt. She is needing to scheduled for her 4 month ROV. Pt was scheduled with CY on 07/03/16 but had to cancel. She wanted to see CY on 07/04/16 but I advised her that he was fully booked that day. I have scheduled her with TP on 07/04/16 at 10am. Nothing further was needed.

## 2016-06-23 ENCOUNTER — Inpatient Hospital Stay (HOSPITAL_COMMUNITY)
Admission: EM | Admit: 2016-06-23 | Discharge: 2016-06-26 | DRG: 202 | Disposition: A | Discharge status: Home / Self Care | Payer: BLUE CROSS/BLUE SHIELD | Attending: Internal Medicine | Admitting: Internal Medicine

## 2016-06-23 ENCOUNTER — Encounter (HOSPITAL_COMMUNITY): Payer: Self-pay | Admitting: Emergency Medicine

## 2016-06-23 ENCOUNTER — Inpatient Hospital Stay (HOSPITAL_COMMUNITY): Payer: BLUE CROSS/BLUE SHIELD

## 2016-06-23 ENCOUNTER — Emergency Department (HOSPITAL_COMMUNITY): Payer: BLUE CROSS/BLUE SHIELD

## 2016-06-23 DIAGNOSIS — R0603 Acute respiratory distress: Secondary | ICD-10-CM

## 2016-06-23 DIAGNOSIS — E669 Obesity, unspecified: Secondary | ICD-10-CM | POA: Diagnosis present

## 2016-06-23 DIAGNOSIS — J9621 Acute and chronic respiratory failure with hypoxia: Secondary | ICD-10-CM

## 2016-06-23 DIAGNOSIS — R0600 Dyspnea, unspecified: Secondary | ICD-10-CM

## 2016-06-23 DIAGNOSIS — J9600 Acute respiratory failure, unspecified whether with hypoxia or hypercapnia: Secondary | ICD-10-CM | POA: Diagnosis present

## 2016-06-23 DIAGNOSIS — J209 Acute bronchitis, unspecified: Secondary | ICD-10-CM | POA: Diagnosis present

## 2016-06-23 DIAGNOSIS — Z7951 Long term (current) use of inhaled steroids: Secondary | ICD-10-CM

## 2016-06-23 DIAGNOSIS — J440 Chronic obstructive pulmonary disease with acute lower respiratory infection: Secondary | ICD-10-CM | POA: Diagnosis present

## 2016-06-23 DIAGNOSIS — J383 Other diseases of vocal cords: Secondary | ICD-10-CM | POA: Diagnosis present

## 2016-06-23 DIAGNOSIS — J45902 Unspecified asthma with status asthmaticus: Secondary | ICD-10-CM

## 2016-06-23 DIAGNOSIS — Z6841 Body Mass Index (BMI) 40.0 and over, adult: Secondary | ICD-10-CM | POA: Diagnosis not present

## 2016-06-23 DIAGNOSIS — J019 Acute sinusitis, unspecified: Secondary | ICD-10-CM | POA: Diagnosis present

## 2016-06-23 DIAGNOSIS — R0602 Shortness of breath: Secondary | ICD-10-CM | POA: Diagnosis present

## 2016-06-23 HISTORY — DX: Chronic obstructive pulmonary disease, unspecified: J44.9

## 2016-06-23 LAB — CBC
HCT: 38.4 % (ref 36.0–46.0)
HCT: 40.7 % (ref 36.0–46.0)
Hemoglobin: 12 g/dL (ref 12.0–15.0)
Hemoglobin: 12.7 g/dL (ref 12.0–15.0)
MCH: 26.7 pg (ref 26.0–34.0)
MCH: 26.8 pg (ref 26.0–34.0)
MCHC: 31.3 g/dL (ref 30.0–36.0)
MCHC: 31.2 g/dL (ref 30.0–36.0)
MCV: 85.5 fL (ref 78.0–100.0)
MCV: 86 fL (ref 78.0–100.0)
PLATELETS: 185 10*3/uL (ref 150–400)
PLATELETS: 173 10*3/uL (ref 150–400)
RBC: 4.49 MIL/uL (ref 3.87–5.11)
RBC: 4.73 MIL/uL (ref 3.87–5.11)
RDW: 13.8 % (ref 11.5–15.5)
RDW: 13.9 % (ref 11.5–15.5)
WBC: 11 10*3/uL — AB (ref 4.0–10.5)
WBC: 6.6 10*3/uL (ref 4.0–10.5)

## 2016-06-23 LAB — ECHOCARDIOGRAM COMPLETE
Height: 61 in
Weight: 3440 oz

## 2016-06-23 LAB — COMPREHENSIVE METABOLIC PANEL
ALT: 31 U/L (ref 14–54)
ANION GAP: 14 (ref 5–15)
AST: 30 U/L (ref 15–41)
Albumin: 4.1 g/dL (ref 3.5–5.0)
Alkaline Phosphatase: 57 U/L (ref 38–126)
BUN: 7 mg/dL (ref 6–20)
CHLORIDE: 105 mmol/L (ref 101–111)
CO2: 20 mmol/L — AB (ref 22–32)
Calcium: 8.6 mg/dL — ABNORMAL LOW (ref 8.9–10.3)
Creatinine, Ser: 0.61 mg/dL (ref 0.44–1.00)
GFR calc non Af Amer: >60 mL/min (ref >60)
Glucose, Bld: 170 mg/dL — ABNORMAL HIGH (ref 65–99)
POTASSIUM: 3.7 mmol/L (ref 3.5–5.1)
SODIUM: 139 mmol/L (ref 135–145)
Total Bilirubin: 0.6 mg/dL (ref 0.3–1.2)
Total Protein: 7.3 g/dL (ref 6.5–8.1)

## 2016-06-23 LAB — CREATININE, SERUM
Creatinine, Ser: 0.63 mg/dL (ref 0.44–1.00)
Creatinine, Ser: 0.65 mg/dL (ref 0.44–1.00)

## 2016-06-23 LAB — CBC WITH DIFFERENTIAL/PLATELET
BASOS ABS: 0 10*3/uL (ref 0.0–0.1)
BASOS PCT: 0 %
Eosinophils Absolute: 0.3 10*3/uL (ref 0.0–0.7)
Eosinophils Relative: 3 %
HEMATOCRIT: 38.5 % (ref 36.0–46.0)
Hemoglobin: 11.9 g/dL — ABNORMAL LOW (ref 12.0–15.0)
LYMPHS PCT: 39 %
Lymphs Abs: 3.7 10*3/uL (ref 0.7–4.0)
MCH: 26.4 pg (ref 26.0–34.0)
MCHC: 30.9 g/dL (ref 30.0–36.0)
MCV: 85.4 fL (ref 78.0–100.0)
Monocytes Absolute: 0.7 10*3/uL (ref 0.1–1.0)
Monocytes Relative: 8 %
NEUTROS ABS: 4.6 10*3/uL (ref 1.7–7.7)
Neutrophils Relative %: 50 %
PLATELETS: 177 10*3/uL (ref 150–400)
RBC: 4.51 MIL/uL (ref 3.87–5.11)
RDW: 13.8 % (ref 11.5–15.5)
WBC: 9.3 10*3/uL (ref 4.0–10.5)

## 2016-06-23 LAB — I-STAT VENOUS BLOOD GAS, ED
Acid-base deficit: 2 mmol/L (ref 0.0–2.0)
BICARBONATE: 25.9 mmol/L (ref 20.0–28.0)
O2 SAT: 70 %
PCO2 VEN: 54 mmHg (ref 44.0–60.0)
PH VEN: 7.289 (ref 7.250–7.430)
TCO2: 28 mmol/L (ref 0–100)
pO2, Ven: 42 mmHg (ref 32.0–45.0)

## 2016-06-23 LAB — TROPONIN I
Troponin I: 0.07 ng/mL (ref <0.03)
Troponin I: 0.09 ng/mL (ref <0.03)
Troponin I: 0.09 ng/mL (ref <0.03)

## 2016-06-23 LAB — INFLUENZA PANEL BY PCR (TYPE A & B)
INFLBPCR: NEGATIVE
Influenza A By PCR: NEGATIVE

## 2016-06-23 LAB — GLUCOSE, CAPILLARY
GLUCOSE-CAPILLARY: 122 mg/dL — AB (ref 65–99)
GLUCOSE-CAPILLARY: 167 mg/dL — AB (ref 65–99)
GLUCOSE-CAPILLARY: 168 mg/dL — AB (ref 65–99)
Glucose-Capillary: 195 mg/dL — ABNORMAL HIGH (ref 65–99)

## 2016-06-23 LAB — BASIC METABOLIC PANEL
ANION GAP: 10 (ref 5–15)
BUN: 9 mg/dL (ref 6–20)
CO2: 23 mmol/L (ref 22–32)
Calcium: 8.7 mg/dL — ABNORMAL LOW (ref 8.9–10.3)
Chloride: 105 mmol/L (ref 101–111)
Creatinine, Ser: 0.57 mg/dL (ref 0.44–1.00)
Glucose, Bld: 166 mg/dL — ABNORMAL HIGH (ref 65–99)
POTASSIUM: 3.8 mmol/L (ref 3.5–5.1)
SODIUM: 138 mmol/L (ref 135–145)

## 2016-06-23 LAB — STREP PNEUMONIAE URINARY ANTIGEN: STREP PNEUMO URINARY ANTIGEN: NEGATIVE

## 2016-06-23 LAB — MAGNESIUM: MAGNESIUM: 2.1 mg/dL (ref 1.7–2.4)

## 2016-06-23 LAB — PHOSPHORUS: Phosphorus: 2.5 mg/dL (ref 2.5–4.6)

## 2016-06-23 LAB — LACTIC ACID, PLASMA
LACTIC ACID, VENOUS: 3.6 mmol/L — AB (ref 0.5–1.9)
Lactic Acid, Venous: 2.7 mmol/L (ref 0.5–1.9)

## 2016-06-23 LAB — MRSA PCR SCREENING: MRSA by PCR: NEGATIVE

## 2016-06-23 MED ORDER — ONDANSETRON HCL 4 MG PO TABS
4.0000 mg | ORAL_TABLET | Freq: Four times a day (QID) | ORAL | Status: DC | PRN
Start: 2016-06-23 — End: 2016-06-26

## 2016-06-23 MED ORDER — DEXTROSE 5 % IV SOLN
2.0000 g | Freq: Every day | INTRAVENOUS | Status: DC
  Administered 2016-06-23: 2 g via INTRAVENOUS
  Filled 2016-06-23: qty 2

## 2016-06-23 MED ORDER — PERFLUTREN LIPID MICROSPHERE
INTRAVENOUS | Status: AC
  Administered 2016-06-23: 2 mL via INTRAVENOUS
  Filled 2016-06-23: qty 10

## 2016-06-23 MED ORDER — ASPIRIN 300 MG RE SUPP: 300.0000 mg | RECTAL | Status: AC

## 2016-06-23 MED ORDER — ASPIRIN 81 MG PO CHEW
324.0000 mg | CHEWABLE_TABLET | ORAL | Status: AC
  Administered 2016-06-23: 324 mg via ORAL
  Filled 2016-06-23: qty 4

## 2016-06-23 MED ORDER — HEPARIN SODIUM (PORCINE) 5000 UNIT/ML IJ SOLN
5000.0000 [IU] | Freq: Three times a day (TID) | INTRAMUSCULAR | Status: DC
  Administered 2016-06-23 – 2016-06-26 (×9): 5000 [IU] via SUBCUTANEOUS
  Filled 2016-06-23 (×10): qty 1

## 2016-06-23 MED ORDER — SENNOSIDES-DOCUSATE SODIUM 8.6-50 MG PO TABS
1.0000 | ORAL_TABLET | Freq: Every evening | ORAL | Status: DC | PRN
  Filled 2016-06-23: qty 1

## 2016-06-23 MED ORDER — ONDANSETRON HCL 4 MG/2ML IJ SOLN: 4.0000 mg | Freq: Four times a day (QID) | INTRAMUSCULAR | Status: DC | PRN

## 2016-06-23 MED ORDER — IPRATROPIUM-ALBUTEROL 0.5-2.5 (3) MG/3ML IN SOLN
3.0000 mL | RESPIRATORY_TRACT | Status: DC
  Administered 2016-06-23 – 2016-06-24 (×5): 3 mL via RESPIRATORY_TRACT
  Filled 2016-06-23 (×5): qty 3

## 2016-06-23 MED ORDER — SODIUM CHLORIDE 0.9% FLUSH: 3.0000 mL | INTRAVENOUS | Status: DC | PRN

## 2016-06-23 MED ORDER — ACETAMINOPHEN 325 MG PO TABS
650.0000 mg | ORAL_TABLET | ORAL | Status: DC | PRN
  Administered 2016-06-24: 650 mg via ORAL
  Filled 2016-06-23: qty 2

## 2016-06-23 MED ORDER — PNEUMOCOCCAL VAC POLYVALENT 25 MCG/0.5ML IJ INJ
0.5000 mL | INJECTION | INTRAMUSCULAR | Status: AC
  Administered 2016-06-24: 0.5 mL via INTRAMUSCULAR
  Filled 2016-06-23: qty 0.5

## 2016-06-23 MED ORDER — ENOXAPARIN SODIUM 40 MG/0.4ML ~~LOC~~ SOLN: 40.0000 mg | SUBCUTANEOUS | Status: DC

## 2016-06-23 MED ORDER — ACETAMINOPHEN 650 MG RE SUPP: 650.0000 mg | Freq: Four times a day (QID) | RECTAL | Status: DC | PRN

## 2016-06-23 MED ORDER — SODIUM CHLORIDE 0.9% FLUSH
3.0000 mL | Freq: Two times a day (BID) | INTRAVENOUS | Status: DC
  Administered 2016-06-23 – 2016-06-26 (×6): 3 mL via INTRAVENOUS

## 2016-06-23 MED ORDER — ALBUTEROL (5 MG/ML) CONTINUOUS INHALATION SOLN
10.0000 mg/h | INHALATION_SOLUTION | RESPIRATORY_TRACT | Status: DC
  Administered 2016-06-23: 10 mg/h via RESPIRATORY_TRACT
  Filled 2016-06-23: qty 20

## 2016-06-23 MED ORDER — GUAIFENESIN ER 600 MG PO TB12
600.0000 mg | ORAL_TABLET | Freq: Two times a day (BID) | ORAL | Status: DC
  Administered 2016-06-23 – 2016-06-26 (×7): 600 mg via ORAL
  Filled 2016-06-23 (×7): qty 1

## 2016-06-23 MED ORDER — METHYLPREDNISOLONE SODIUM SUCC 125 MG IJ SOLR: 125.0000 mg | Freq: Two times a day (BID) | INTRAMUSCULAR | Status: DC

## 2016-06-23 MED ORDER — ALBUTEROL SULFATE (2.5 MG/3ML) 0.083% IN NEBU: 2.5000 mg | INHALATION_SOLUTION | RESPIRATORY_TRACT | Status: DC | PRN

## 2016-06-23 MED ORDER — LEVOFLOXACIN IN D5W 750 MG/150ML IV SOLN
750.0000 mg | INTRAVENOUS | Status: DC
  Administered 2016-06-23: 750 mg via INTRAVENOUS
  Filled 2016-06-23 (×2): qty 150

## 2016-06-23 MED ORDER — IOPAMIDOL (ISOVUE-370) INJECTION 76%
INTRAVENOUS | Status: AC
  Administered 2016-06-23: 100 mL
  Filled 2016-06-23: qty 100

## 2016-06-23 MED ORDER — METHYLPREDNISOLONE SODIUM SUCC 125 MG IJ SOLR
125.0000 mg | Freq: Four times a day (QID) | INTRAMUSCULAR | Status: DC
  Administered 2016-06-23 – 2016-06-24 (×4): 125 mg via INTRAVENOUS
  Filled 2016-06-23 (×4): qty 2

## 2016-06-23 MED ORDER — KCL IN DEXTROSE-NACL 20-5-0.45 MEQ/L-%-% IV SOLN
INTRAVENOUS | Status: DC
  Administered 2016-06-23: 12:00:00 via INTRAVENOUS
  Filled 2016-06-23: qty 1000

## 2016-06-23 MED ORDER — HYDRALAZINE HCL 20 MG/ML IJ SOLN
10.0000 mg | Freq: Three times a day (TID) | INTRAMUSCULAR | Status: DC | PRN
Start: 2016-06-23 — End: 2016-06-26

## 2016-06-23 MED ORDER — IPRATROPIUM-ALBUTEROL 0.5-2.5 (3) MG/3ML IN SOLN: 3.0000 mL | Freq: Four times a day (QID) | RESPIRATORY_TRACT | Status: DC

## 2016-06-23 MED ORDER — FAMOTIDINE 20 MG PO TABS
20.0000 mg | ORAL_TABLET | Freq: Two times a day (BID) | ORAL | Status: DC
  Administered 2016-06-23 – 2016-06-26 (×6): 20 mg via ORAL
  Filled 2016-06-23 (×6): qty 1

## 2016-06-23 MED ORDER — ALBUTEROL (5 MG/ML) CONTINUOUS INHALATION SOLN
10.0000 mg/h | INHALATION_SOLUTION | Freq: Once | RESPIRATORY_TRACT | Status: AC
  Administered 2016-06-23: 10 mg/h via RESPIRATORY_TRACT
  Filled 2016-06-23: qty 20

## 2016-06-23 MED ORDER — INSULIN ASPART 100 UNIT/ML ~~LOC~~ SOLN
2.0000 [IU] | SUBCUTANEOUS | Status: DC
  Administered 2016-06-23 (×3): 4 [IU] via SUBCUTANEOUS
  Administered 2016-06-24 (×2): 2 [IU] via SUBCUTANEOUS

## 2016-06-23 MED ORDER — PERFLUTREN LIPID MICROSPHERE
1.0000 mL | INTRAVENOUS | Status: AC | PRN
  Administered 2016-06-23: 2 mL via INTRAVENOUS
  Filled 2016-06-23: qty 10

## 2016-06-23 MED ORDER — SODIUM CHLORIDE 0.9 % IV SOLN
250.0000 mL | INTRAVENOUS | Status: DC | PRN
Start: 2016-06-23 — End: 2016-06-26

## 2016-06-23 MED ORDER — SODIUM CHLORIDE 0.9 % IV SOLN
INTRAVENOUS | Status: DC
  Administered 2016-06-23: 12:00:00 via INTRAVENOUS

## 2016-06-23 MED ORDER — ACETAMINOPHEN 325 MG PO TABS: 650.0000 mg | ORAL_TABLET | Freq: Four times a day (QID) | ORAL | Status: DC | PRN

## 2016-06-23 MED ORDER — ONDANSETRON HCL 4 MG/2ML IJ SOLN
4.0000 mg | Freq: Four times a day (QID) | INTRAMUSCULAR | Status: DC | PRN
Start: 2016-06-23 — End: 2016-06-26

## 2016-06-23 MED ORDER — SODIUM CHLORIDE 0.9 % IV SOLN: 250.0000 mL | INTRAVENOUS | Status: DC | PRN

## 2016-06-23 NOTE — Progress Notes (Signed)
  Echocardiogram 2D Echocardiogram with Definity has been performed.  Diamond Nickel 06/23/2016, 1:46 PM

## 2016-06-23 NOTE — Progress Notes (Signed)
[facility] Progress Note Patient Name: Jocelyn Myers DOB: June 04, 1965 MRN: [id number]   Date of Service  06/23/2016  HPI/Events of Note  On 0.9 NaCl at 100 mL/hour and D5 0.45 NaCl + KCL at 199mL/hour.   eICU Interventions  Will D/C D5 0.45 + KCl IV fluid.      Intervention Category Major Interventions: Other:  Lysle Dingwall 06/23/2016, 7:29 PM

## 2016-06-23 NOTE — H&P (Signed)
[facility] History and Physical  Jocelyn Myers [id number] DOB: [birth year] DOA: 06/23/2016  Referring physician:  PCP: No primary care provider on file.   Chief Complaint: "3 months."  HPI: Jocelyn Myers is a 51 y.o. female  with past medical history of asthma/COPD followed by pulmonology presents to the hospital with chief complaint of sob. Patient states that she has been short of breath with productive cough for 3 months. She has not been able to get in with her pulmonologist Dr. Annamaria Boots due to scheduling. Dr. Janee Morn office ihad 3 four-day tapers of prednisone along with 2 courses of  azithromycin called in for the patient. These have only provided the patient with momentary decrease in her symptoms but never full relief. Things came to a head when patient became acutely dyspneic this morning. Her husband activated EMS and patient was administered magnesium, Solu-Medrol, albuterol and Atrovent. This gave patient some relief but she was still very short of breath. In the emergency room patient was placed on BiPAP.  Review of Systems:  As per HPI otherwise 10 point review of systems negative.    Past Medical History:  Diagnosis Date  . Asthma   . COPD (chronic obstructive pulmonary disease) ([place])    Past Surgical History:  Procedure Laterality Date  . DILATION AND CURETTAGE OF UTERUS  2009   polyp removed  . TUBAL LIGATION     Social History:  reports that she has never smoked. She does not have any smokeless tobacco history on file. She reports that she does not drink alcohol or use drugs.  Allergies  Allergen Reactions  . Benadryl [Diphenhydramine Hcl] Hives and Shortness Of Breath    Family History  Problem Relation Age of Onset  . Asthma Son      Prior to Admission medications   Medication Sig Start Date End Date Taking? Authorizing Provider  albuterol (PROAIR HFA) 108 (90 BASE) MCG/ACT inhaler Inhale 2 puffs into the lungs every 6 (six) hours as needed for  wheezing or shortness of breath. 04/29/13  Yes Noralee Space, MD  albuterol (PROVENTIL) (2.5 MG/3ML) 0.083% nebulizer solution INHALE 1 VIAL BY NEBULIZATION EVERY 6 HOURS AS NEEDED FOR WHEEZING OR SHORTNESS OF BREATH 04/13/15  Yes Deneise Lever, MD  benzonatate (TESSALON) 200 MG capsule Take 1 capsule (200 mg total) by mouth 3 (three) times daily as needed for cough. 05/09/16  Yes Deneise Lever, MD  Glycopyrrolate-Formoterol (BEVESPI AEROSPHERE) 9-4.8 MCG/ACT AERO Inhale 2 puffs into the lungs 2 (two) times daily. 05/05/16  Yes Deneise Lever, MD   Physical Exam: Vitals:   06/23/16 0620 06/23/16 0630 06/23/16 0700 06/23/16 0730  BP:  140/95 143/85 127/80  Pulse: 108 111 109 109  Resp: 24 (!) 27 21 24   Temp:      TempSrc:      SpO2: 98% 99% 100% 100%  Weight:      Height:        Wt Readings from Last 3 Encounters:  06/23/16 97.5 kg (215 lb)  02/24/16 92.2 kg (203 lb 3.2 oz)  08/02/15 89.3 kg (196 lb 12.8 oz)    General:  Appears calm and comfortable, and Restoril distress, alert and oriented 3, BiPAP Eyes:  PERRL, EOMI, normal lids, iris ENT:  grossly normal hearing, lips & tongue Neck:  no LAD, masses or thyromegaly Cardiovascular:  RRR, no m/r/g. No LE edema.  Respiratory:  Increased respiratory effort, as per next 3 wheezing Abdomen:  soft, ntnd Skin:  no rash or induration seen on limited exam Musculoskeletal:  grossly normal tone BUE/BLE Psychiatric:  grossly normal mood and affect, speech fluent and appropriate Neurologic:  CN 2-12 grossly intact, moves all extremities in coordinated fashion.          Labs on Admission:  Basic Metabolic Panel:  Recent Labs Lab 06/23/16 0630  NA 138  K 3.8  CL 105  CO2 23  GLUCOSE 166*  BUN 9  CREATININE 0.57  CALCIUM 8.7*   Liver Function Tests: No results for input(s): AST, ALT, ALKPHOS, BILITOT, PROT, ALBUMIN in the last 168 hours. No results for input(s): LIPASE, AMYLASE in the last 168 hours. No results for  input(s): AMMONIA in the last 168 hours. CBC:  Recent Labs Lab 06/23/16 0630  WBC 9.3  NEUTROABS 4.6  HGB 11.9*  HCT 38.5  MCV 85.4  PLT 177   Cardiac Enzymes: No results for input(s): CKTOTAL, CKMB, CKMBINDEX, TROPONINI in the last 168 hours.  BNP (last 3 results) No results for input(s): BNP in the last 8760 hours.  ProBNP (last 3 results) No results for input(s): PROBNP in the last 8760 hours.   Serum creatinine: 0.57 mg/dL 06/23/16 0630 Estimated creatinine clearance: 90.9 mL/min  CBG: No results for input(s): GLUCAP in the last 168 hours.  Radiological Exams on Admission: Dg Chest Portable 1 View  Result Date: 06/23/2016 CLINICAL DATA:  Shortness of breath today. EXAM: PORTABLE CHEST 1 VIEW COMPARISON:  06/06/2013 FINDINGS: The heart size and mediastinal contours are within normal limits. Both lungs are clear. The visualized skeletal structures are unremarkable. IMPRESSION: No active disease. Electronically Signed   By: Lucienne Capers M.D.   On: 06/23/2016 06:43    EKG: Independently reviewed. Sinus tachycardia, abnormal R-wave transition which appears to be lead placement. No STEMI.  Assessment/Plan Active Problems:   Status asthmaticus  =Status asthmaticus Scheduled DuoNeb's When necessary albuterol Antibiotic- rocephin Oxygen therapy Continuous pulse oximetry Respiratory consult Flutter valve Incentive spirometer Mucinex Walk test tomorrow Sputum culture Will hydrate pt and CXR in AM Pt requested pulm consult, paged put  Code Status: full DVT Prophylaxis: lovenox Family Communication: family at bedside Disposition Plan: Pending Improvement  Status: SDU, inpt  Elwin Mocha, MD Family Medicine [facility] [email or web address] Password [email or web address]

## 2016-06-23 NOTE — ED Notes (Signed)
EDP at bedside, pt appears to be a little bit more comfortable on bipap. Still drowsy but work of breathing appears to be better.

## 2016-06-23 NOTE — H&P (Signed)
PULMONARY / CRITICAL CARE MEDICINE   Name: Jocelyn Myers MRN: [id number] DOB: 22-Mar-1966    ADMISSION DATE:  06/23/2016 CONSULTATION DATE:  06/23/2016  REFERRING MD:  ED  CHIEF COMPLAINT:  SOB/Asthma Exacerbation  HISTORY OF PRESENT ILLNESS:   MALONIE DEGREE is a 51 y.o. female  with past medical history of asthma/COPD followed by pulmonology presents to the hospital with chief complaint of sob. Patient states that she has been short of breath with productive cough for 3 months. She has not been able to get in with her pulmonologist Dr. Annamaria Boots due to scheduling. Dr. Janee Morn office had called in 3 four-day tapers of prednisone along with 2 courses of  azithromycin for the patient. These have only provided the patient with momentary decrease in her symptoms but never full relief.She did not follow up with pulmonary.  Things came to a head when patient became acutely dyspneic early am 2/16.She attempted albuterol nebs at home x 2 without relief. Her husband activated EMS and patient was administered magnesium, Solu-Medrol, albuterol and Atrovent. This gave patient some relief but she was still very short of breath In the emergency room where she was placed on BiPap.She continued to be very short of breath, and wheezing despite a 30 min. Continuous neb treatment, so CCM was consulted. Lactate was 2.7, CXR clear for no active disease .Pt. Will be admitted to ICU with BiPAP 4 on 2 off.  PAST MEDICAL HISTORY :  She  has a past medical history of Asthma and COPD (chronic obstructive pulmonary disease) ([place]).  PAST SURGICAL HISTORY: She  has a past surgical history that includes Dilation and curettage of uterus (2009) and Tubal ligation.  Allergies  Allergen Reactions  . Benadryl [Diphenhydramine Hcl] Hives and Shortness Of Breath    No current facility-administered medications on file prior to encounter.    Current Outpatient Prescriptions on File Prior to Encounter  Medication Sig  . albuterol  (PROAIR HFA) 108 (90 BASE) MCG/ACT inhaler Inhale 2 puffs into the lungs every 6 (six) hours as needed for wheezing or shortness of breath.  Marland Kitchen albuterol (PROVENTIL) (2.5 MG/3ML) 0.083% nebulizer solution INHALE 1 VIAL BY NEBULIZATION EVERY 6 HOURS AS NEEDED FOR WHEEZING OR SHORTNESS OF BREATH  . benzonatate (TESSALON) 200 MG capsule Take 1 capsule (200 mg total) by mouth 3 (three) times daily as needed for cough.  . Glycopyrrolate-Formoterol (BEVESPI AEROSPHERE) 9-4.8 MCG/ACT AERO Inhale 2 puffs into the lungs 2 (two) times daily.    FAMILY HISTORY:  Her indicated that the status of her son is unknown.    SOCIAL HISTORY: She  reports that she has never smoked. She does not have any smokeless tobacco history on file. She reports that she does not drink alcohol or use drugs.  REVIEW OF SYSTEMS:   Constitutional:   No  weight loss, night sweats,  Fevers, chills, + fatigue, or  lassitude.  HEENT:   No headaches,  Difficulty swallowing,  Tooth/dental problems, or  Sore throat,                No sneezing, itching, ear ache, nasal congestion, post nasal drip,   CV:  No chest pain,  + Orthopnea, PND, swelling in lower extremities, anasarca, dizziness, palpitations, syncope.   GI  No heartburn, indigestion, abdominal pain, nausea, vomiting, diarrhea, change in bowel habits, loss of appetite, bloody stools.   Resp: + shortness of breath with exertion and at rest.  No excess mucus, no productive cough,  +  non-productive cough,  No coughing up of blood.  No change in color of mucus.  ++ wheezing.  No chest wall deformity  Skin: no rash or lesions.  GU: no dysuria, change in color of urine, no urgency or frequency.  No flank pain, no hematuria   MS:  No joint pain or swelling.  No decreased range of motion.  No back pain.  Psych:  No change in mood or affect. No depression or anxiety.  No memory loss.      SUBJECTIVE:  On BiPap, writing notes. States she is still SOB, but much better on  BiPAP.  VITAL SIGNS: BP 118/90 (BP Location: Right Arm)   Pulse 103   Temp 98.5 F (36.9 C) (Axillary)   Resp 22   Ht 5\' 1"  (1.549 m)   Wt 215 lb (97.5 kg)   LMP 02/22/2011   SpO2 100%   BMI 40.62 kg/m   HEMODYNAMICS:    VENTILATOR SETTINGS: Vent Mode: BIPAP;PCV FiO2 (%):  [40 %] 40 % Set Rate:  [15 bmp] 15 bmp PEEP:  [5 cmH20] 5 cmH20  INTAKE / OUTPUT: No intake/output data recorded.  PHYSICAL EXAMINATION: Physical Exam:  General- No distress on BIPAP,  A&Ox3, anxious ENT: No sinus tenderness, TM clear, pale nasal mucosa, no oral exudate,no post nasal drip, no LAN, thick neck Cardiac: S1, S2, regular rate and rhythm, no murmur Chest: + wheeze insp/ exp throughout/ no rales/ dullness; no accessory muscle use, no nasal flaring, no sternal retractions Abd.: Soft Non-tender, obese Ext: No clubbing cyanosis, edema Neuro:  normal strength, MAEx 4, A&O x 4 Skin: No rashes, warm and dry Psych: appropriately anxious  LABS:  BMET  Recent Labs Lab 06/23/16 0630 06/23/16 0846  NA 138  --   K 3.8  --   CL 105  --   CO2 23  --   BUN 9  --   CREATININE 0.57 0.63  GLUCOSE 166*  --     Electrolytes  Recent Labs Lab 06/23/16 0630  CALCIUM 8.7*    CBC  Recent Labs Lab 06/23/16 0630 06/23/16 0846  WBC 9.3 11.0*  HGB 11.9* 12.0  HCT 38.5 38.4  PLT 177 185    Coag's No results for input(s): APTT, INR in the last 168 hours.  Sepsis Markers  Recent Labs Lab 06/23/16 0846  LATICACIDVEN 2.7*    ABG No results for input(s): PHART, PCO2ART, PO2ART in the last 168 hours.  Liver Enzymes No results for input(s): AST, ALT, ALKPHOS, BILITOT, ALBUMIN in the last 168 hours.  Cardiac Enzymes No results for input(s): TROPONINI, PROBNP in the last 168 hours.  Glucose No results for input(s): GLUCAP in the last 168 hours.  Imaging Dg Chest Portable 1 View  Result Date: 06/23/2016 CLINICAL DATA:  Shortness of breath today. EXAM: PORTABLE CHEST 1 VIEW  COMPARISON:  06/06/2013 FINDINGS: The heart size and mediastinal contours are within normal limits. Both lungs are clear. The visualized skeletal structures are unremarkable. IMPRESSION: No active disease. Electronically Signed   By: Lucienne Capers M.D.   On: 06/23/2016 06:43     STUDIES:  CTA 2/16>> Echo 2/16>>  CULTURES: Blood 2/16>> Urine Strep 2/16>> Urine Legionella 2/16>>  ANTIBIOTICS: Rocephin >> 2/16-2/16 Levofloxacin 2/16>>  SIGNIFICANT EVENTS: 06/23/2016>> Admit Asthma/COPD exacerbation  LINES/TUBES: PIV  DISCUSSION: CAYLEI HAWKIN is a 51 y.o. female  with past medical history of asthma/COPD followed by Dr. Baird Lyons who presents to the hospital with chief complaint of sob. Patient  states that she has been short of breath with productive cough for 3 months. Outpatient treatment x 4 pred taper and ABX x 2 failed over 2 month period. She developed acute worsening 2/16, not responsive to albuterol nebs. She is in the ED on BIPAP. Saturations 98% with continued inspiratory and expiratory wheezing, and chest tightness.Lactate was 2.7 in ED. She will be admitted to ICU on BiPAP for close observation.  ASSESSMENT / PLAN:  PULMONARY A: Acute on Chronic Respiratory Failure  Asthma exacerbation / potential for COPD exacerbation Lactate 2.7 CXR No Acute Cardiopulmonary process Mag 2 GM per ED R/O PE Good Volumes on BIPAP P:   BiPAP 4 hours on 2 off CTA now Maintain oxygen saturations 90-92% IV Solumedrol 125 mg Q 6 Continuous nebs x 30 minutes now Duo nebs  Q 4 scheduled Albuterol Q 2 prn Check MAG/PHOS at noon CXR in am   CARDIOVASCULAR A:  SR per Telemetry No cardiac history P:  ICU Telemetry monitoring Troponin Echo now VS per ICU   RENAL A:   Creatinine 0.63 Hypocalcemia /? Will check albumin to see if corrects P:   Monitor urine output CMET now Trend Creatinine/ BUN Monitor for hyperkalemia with frequent albuterol Trend  UO    GASTROINTESTINAL A:   No Active Issues P:   SUP Clear Liquids for now  HEMATOLOGIC A:   HGB 12.0 No Active Bleeding Noted Leukocytosis P:  Heparin DVT prophylaxis Trend CBC  INFECTIOUS A:   Afebrile CXR no acute process Leukocytosis  Lactate 2.7 P:   Trend Fever/ WBC Levaquin to cover atypical's per pharmacy Cultures as above Follow up on micro daily Trend Lactate until clears Maint IVF @ 100/ hr CXR 2/17  ENDOCRINE A:   Glucose on admission 94 Risk of hyperglycemia due to steroids  P:   CBG q 4 SSI if consistently > 180   NEUROLOGIC A:   No Acute Issues P:  Monitor Neuro status per unit routine  RASS goal: 0-1+    FAMILY  - Updates:Patient Husband and son updated at bedside.They verbalized undsersanding of plan to admit.    - Inter-disciplinary family meet or Palliative Care meeting due by:  06/30/2016.   Magdalen Spatz, AGACNP-BC [facility] [place] Pager: [phone]  06/23/2016, 10:16 AM   STAFF NOTE: I, Merrie Roof, MD FACP have personally reviewed patient's available data, including medical history, events of note, physical examination and test results as part of my evaluation. I have discussed with resident/NP and other care providers such as pharmacist, RN and RRT. In addition, I personally evaluated patient and elicited key findings of: awake, on NIMV, moderate resp failure, diffuse insp and exp wheezing, moderate in nature, pcxr neg infiltrate, acute onset and has been suffering with exacerbations over last 4-6 weeks, , VBG noted, she requires NIMV scheduled, obtain lactic acid, with abrupt onset consider ct angio, echo assessment, albuterol aggressive, give continuous now, solumedral 125 q6h, liquids okay, will covera typical infection with quinolone for now, I updated pt and husband and son in full, monitor in icu , may require ett The patient is critically ill with multiple organ systems  failure and requires high complexity decision making for assessment and support, frequent evaluation and titration of therapies, application of advanced monitoring technologies and extensive interpretation of multiple databases.   Critical Care Time devoted to patient care services described in this note is 50 Minutes. This time reflects time of care of this signee: Quillian Quince  Titus Mould, [place]. This critical care time does not reflect procedure time, or teaching time or supervisory time of PA/NP/Med student/Med Resident etc but could involve care discussion time. Rest per NP/medical resident whose note is outlined above and that I agree with   Lavon Paganini. Titus Mould, MD, [place] Pgr: [facility] 06/23/2016 11:58 AM

## 2016-06-23 NOTE — Progress Notes (Signed)
Pharmacy Antibiotic Note  Jocelyn Myers is a 51 y.o. female admitted on 06/23/2016 with asthma exacerbation.  Pharmacy has been consulted for levauqin dosing. Pt is afebrile and WBC is mildly elevated at 11. Scr is WNL. Pt has already been started on ceftriaxone and levaquin added for atypical coverage.   Plan: Levaquin 750mg  IV Q24H F/u renal fxn, C&S, clinical status *Pharmacy will sign off as no dose adjustments are anticipated. Thank you for the consult!  Height: 5\' 1"  (154.9 cm) Weight: 215 lb (97.5 kg) IBW/kg (Calculated) : 47.8  Temp (24hrs), Avg:98.5 F (36.9 C), Min:98.5 F (36.9 C), Max:98.5 F (36.9 C)   Recent Labs Lab 06/23/16 0630 06/23/16 0846  WBC 9.3 11.0*  CREATININE 0.57 0.63    Estimated Creatinine Clearance: 90.9 mL/min (by C-G formula based on SCr of 0.63 mg/dL).    Allergies  Allergen Reactions  . Benadryl [Diphenhydramine Hcl] Hives and Shortness Of Breath    Antimicrobials this admission: Levaquin 2/16>> CTX 2/16>>  Dose adjustments this admission: N/A  Microbiology results: Pending  Thank you for allowing pharmacy to be a part of this patient's care.  Aluel Schwarz, Rande Lawman 06/23/2016 9:52 AM

## 2016-06-23 NOTE — ED Notes (Signed)
Resp at bedside placing bipap

## 2016-06-23 NOTE — ED Notes (Signed)
Pt being transported by RT and this RN to CT and then to 4N. 4N RN aware.

## 2016-06-23 NOTE — ED Notes (Signed)
Bed placement made aware pt to have ICU bed request placed instead of SDU bed.

## 2016-06-23 NOTE — ED Notes (Signed)
Attempted report 

## 2016-06-23 NOTE — ED Notes (Signed)
Critical Lactic Acid 2.7 from lab - verbalized to Admitting Team

## 2016-06-23 NOTE — ED Provider Notes (Signed)
[facility] DEPT Provider Note   CSN: [id number] Arrival date & time: 06/23/16  0609     History   Chief Complaint Chief Complaint  Patient presents with  . Respiratory Distress  LEVEL 5 CAVEAT DUE TO ACUITY OF CONDITION   HPI Jocelyn Myers is a 51 y.o. female.  The history is provided by the patient and the EMS personnel. The history is limited by the condition of the patient.  Shortness of Breath  This is a new problem. The problem occurs continuously.The current episode started 6 to 12 hours ago. The problem has been rapidly worsening. Associated symptoms include wheezing. Associated medical issues include asthma.  Patient presents with acute worsening of shortness of breath She has long h/o asthma and has hade frequent cough over past several weeks However over past 12 hrs her cough/wheezing has worsened She does not smoke EMS reports pt was in severe distress on their arrival, given nebs/magnesium/solumedrol She has never been intubated   Past Medical History:  Diagnosis Date  . Asthma   . COPD (chronic obstructive pulmonary disease) [facility])     Patient Active Problem List   Diagnosis Date Noted  . Routine general medical examination at a health care facility 02/23/2014  . Obesity (BMI 35.0-39.9 without comorbidity) 02/23/2014  . Asthma with COPD ([place]) 06/06/2012    Past Surgical History:  Procedure Laterality Date  . DILATION AND CURETTAGE OF UTERUS  2009   polyp removed  . TUBAL LIGATION      OB History    No data available       Home Medications    Prior to Admission medications   Medication Sig Start Date End Date Taking? Authorizing Provider  albuterol (PROAIR HFA) 108 (90 BASE) MCG/ACT inhaler Inhale 2 puffs into the lungs every 6 (six) hours as needed for wheezing or shortness of breath. 04/29/13   Noralee Space, MD  albuterol (PROVENTIL) (2.5 MG/3ML) 0.083% nebulizer solution INHALE 1 VIAL BY NEBULIZATION EVERY 6 HOURS AS NEEDED FOR WHEEZING  OR SHORTNESS OF BREATH 04/13/15   Deneise Lever, MD  azithromycin (ZITHROMAX) 250 MG tablet Take as directed 06/01/16   Deneise Lever, MD  benzonatate (TESSALON) 200 MG capsule Take 1 capsule (200 mg total) by mouth 3 (three) times daily as needed for cough. 05/09/16   Deneise Lever, MD  fluticasone furoate-vilanterol (BREO ELLIPTA) 200-25 MCG/INH AEPB Inhale 1 puff into the lungs daily. 08/02/15   Deneise Lever, MD  Glycopyrrolate-Formoterol (BEVESPI AEROSPHERE) 9-4.8 MCG/ACT AERO Inhale 2 puffs into the lungs 2 (two) times daily. 05/05/16   Deneise Lever, MD  montelukast (SINGULAIR) 10 MG tablet Take 1 tablet (10 mg total) by mouth at bedtime. 02/01/15   Deneise Lever, MD  Multiple Vitamin (MULTIVITAMIN) capsule Take 1 capsule by mouth daily.    Historical Provider, MD  OVER THE COUNTER MEDICATION Allergy Nasal spray OTC-pt unsure of name    Historical Provider, MD  predniSONE (DELTASONE) 10 MG tablet Take 2 tablets (20 mg total) by mouth daily with breakfast. 06/01/16   Deneise Lever, MD    Family History Family History  Problem Relation Age of Onset  . Asthma Son     Social History Social History  Substance Use Topics  . Smoking status: Never Smoker  . Smokeless tobacco: Not on file  . Alcohol use No     Allergies   Benadryl [diphenhydramine hcl]   Review of Systems Review of Systems  Unable to  perform ROS: Acuity of condition  Respiratory: Positive for shortness of breath and wheezing.      Physical Exam Updated Vital Signs LMP 02/22/2011   SpO2 100%   Physical Exam CONSTITUTIONAL: Well developed/well nourished, distress noted HEAD: Normocephalic/atraumatic EYES: EOMI/PERRL ENMT: Mucous membranes moist, uvula midline, no stridor, no exudates NECK: supple no meningeal signs SPINE/BACK:entire spine nontender CV: S1/S2 noted, no murmurs/rubs/gallops noted, tachycardic LUNGS: wheezing/tachypnea noted, pt unable to speak clearly ABDOMEN: soft,  nontender GU:no cva tenderness NEURO: Pt is awake/alert/appropriate, moves all extremitiesx4.  No facial droop.   EXTREMITIES: pulses normal/equal, full ROM, no cal tenderness SKIN: warm, color normal PSYCH: anxious  ED Treatments / Results  Labs (all labs ordered are listed, but only abnormal results are displayed) Labs Reviewed  BASIC METABOLIC PANEL - Abnormal; Notable for the following:       Result Value   Glucose, Bld 166 (*)    Calcium 8.7 (*)    All other components within normal limits  CBC WITH DIFFERENTIAL/PLATELET - Abnormal; Notable for the following:    Hemoglobin 11.9 (*)    All other components within normal limits  INFLUENZA PANEL BY PCR (TYPE A & B)    EKG  EKG Interpretation  Date/Time:  Friday June 23 2016 06:19:10 EST Ventricular Rate:  108 PR Interval:    QRS Duration: 94 QT Interval:  329 QTC Calculation: 441 R Axis:   60 Text Interpretation:  Sinus tachycardia Abnormal R-wave progression, early transition Baseline wander in lead(s) II III aVL aVF V1 V2 V6 \ Interpretation limited secondary to artifact No previous ECGs available Confirmed by Christy Gentles  MD, Aashir Umholtz ([id number]) on 06/23/2016 6:24:22 AM       Radiology Dg Chest Portable 1 View  Result Date: 06/23/2016 CLINICAL DATA:  Shortness of breath today. EXAM: PORTABLE CHEST 1 VIEW COMPARISON:  06/06/2013 FINDINGS: The heart size and mediastinal contours are within normal limits. Both lungs are clear. The visualized skeletal structures are unremarkable. IMPRESSION: No active disease. Electronically Signed   By: Lucienne Capers M.D.   On: 06/23/2016 06:43    Procedures Procedures  CRITICAL CARE Performed by: Sharyon Cable Total critical care time: 31 minutes Critical care time was exclusive of separately billable procedures and treating other patients. Critical care was necessary to treat or prevent imminent or life-threatening deterioration. Critical care was time spent personally by me on  the following activities: development of treatment plan with patient and/or surrogate as well as nursing, discussions with consultants, evaluation of patient's response to treatment, examination of patient, obtaining history from patient or surrogate, ordering and performing treatments and interventions, ordering and review of laboratory studies, ordering and review of radiographic studies, pulse oximetry and re-evaluation of patient's condition. PATIENT WITH STATUS ASTHMATICUS/RESPIRATORY FAILURE REQUIRING BIPAP AND ADMISSION  Medications Ordered in ED Medications  cefTRIAXone (ROCEPHIN) 2 g in dextrose 5 % 50 mL IVPB (not administered)  albuterol (PROVENTIL,VENTOLIN) solution continuous neb (10 mg/hr Nebulization Given 06/23/16 [id number])     Initial Impression / Assessment and Plan / ED Course  I have reviewed the triage vital signs and the nursing notes.  Pertinent labs & imaging results that were available during my care of the patient were reviewed by me and considered in my medical decision making (see chart for details).     6:17 AM Pt seen on arrival Will start bipap and follow closely 7:12 AM Pt improved Awake/alert Work of breathing improved but still with wheezing while on bipap Will call for admission 7:56  AM D/W TRIAD FOR ADMISSION PT STABILIZED/IMPROVED IN THE ED   Final Clinical Impressions(s) / ED Diagnoses   Final diagnoses:  Asthma with status asthmaticus, unspecified asthma severity, unspecified whether persistent    New Prescriptions New Prescriptions   No medications on file     Ripley Fraise, MD 06/23/16 [phone]

## 2016-06-23 NOTE — ED Triage Notes (Signed)
BIB EMS from home, SOB since 1930 last night, worsened at 0400 today. Tried at home neb with no relief. EMS admin duoneb, solumedrol, mg. Audible insp and exp wheezing. ST on monitor. Capnography 50's en route. Pt very drowsy. EDP is at bedside. Order for bipap stat.

## 2016-06-24 ENCOUNTER — Inpatient Hospital Stay (HOSPITAL_COMMUNITY): Payer: BLUE CROSS/BLUE SHIELD

## 2016-06-24 LAB — BASIC METABOLIC PANEL
ANION GAP: 7 (ref 5–15)
BUN: 8 mg/dL (ref 6–20)
CHLORIDE: 112 mmol/L — AB (ref 101–111)
CO2: 22 mmol/L (ref 22–32)
Calcium: 9.1 mg/dL (ref 8.9–10.3)
Creatinine, Ser: 0.51 mg/dL (ref 0.44–1.00)
GFR calc Af Amer: >60 mL/min (ref >60)
Glucose, Bld: 123 mg/dL — ABNORMAL HIGH (ref 65–99)
POTASSIUM: 4.1 mmol/L (ref 3.5–5.1)
SODIUM: 141 mmol/L (ref 135–145)

## 2016-06-24 LAB — CBC WITH DIFFERENTIAL/PLATELET
BASOS ABS: 0 10*3/uL (ref 0.0–0.1)
Basophils Relative: 0 %
EOS PCT: 0 %
Eosinophils Absolute: 0 10*3/uL (ref 0.0–0.7)
HEMATOCRIT: 34.8 % — AB (ref 36.0–46.0)
HEMOGLOBIN: 10.7 g/dL — AB (ref 12.0–15.0)
LYMPHS ABS: 1 10*3/uL (ref 0.7–4.0)
LYMPHS PCT: 8 %
MCH: 26 pg (ref 26.0–34.0)
MCHC: 30.7 g/dL (ref 30.0–36.0)
MCV: 84.7 fL (ref 78.0–100.0)
Monocytes Absolute: 0.2 10*3/uL (ref 0.1–1.0)
Monocytes Relative: 1 %
NEUTROS ABS: 11.3 10*3/uL — AB (ref 1.7–7.7)
NEUTROS PCT: 91 %
Platelets: 162 10*3/uL (ref 150–400)
RBC: 4.11 MIL/uL (ref 3.87–5.11)
RDW: 14 % (ref 11.5–15.5)
WBC: 12.5 10*3/uL — ABNORMAL HIGH (ref 4.0–10.5)

## 2016-06-24 LAB — LEGIONELLA PNEUMOPHILA SEROGP 1 UR AG: L. pneumophila Serogp 1 Ur Ag: NEGATIVE

## 2016-06-24 LAB — GLUCOSE, CAPILLARY
GLUCOSE-CAPILLARY: 136 mg/dL — AB (ref 65–99)
GLUCOSE-CAPILLARY: 126 mg/dL — AB (ref 65–99)

## 2016-06-24 LAB — HIV ANTIBODY (ROUTINE TESTING W REFLEX): HIV SCREEN 4TH GENERATION: NONREACTIVE

## 2016-06-24 MED ORDER — PREDNISONE 20 MG PO TABS
50.0000 mg | ORAL_TABLET | Freq: Every day | ORAL | Status: DC
  Administered 2016-06-24 – 2016-06-26 (×3): 50 mg via ORAL
  Filled 2016-06-24 (×2): qty 2
  Filled 2016-06-24: qty 1

## 2016-06-24 MED ORDER — ALBUTEROL SULFATE (2.5 MG/3ML) 0.083% IN NEBU: 2.5000 mg | INHALATION_SOLUTION | RESPIRATORY_TRACT | Status: DC | PRN

## 2016-06-24 MED ORDER — BENZONATATE 100 MG PO CAPS: 200.0000 mg | ORAL_CAPSULE | Freq: Three times a day (TID) | ORAL | Status: DC | PRN

## 2016-06-24 MED ORDER — WHITE PETROLATUM GEL
Status: AC
  Administered 2016-06-24: 13:00:00
  Filled 2016-06-24: qty 1

## 2016-06-24 MED ORDER — LEVOFLOXACIN 500 MG PO TABS
500.0000 mg | ORAL_TABLET | Freq: Every day | ORAL | Status: DC
  Administered 2016-06-24 – 2016-06-26 (×3): 500 mg via ORAL
  Filled 2016-06-24 (×3): qty 1

## 2016-06-24 MED ORDER — LEVOFLOXACIN 750 MG PO TABS
750.0000 mg | ORAL_TABLET | Freq: Every day | ORAL | Status: DC
  Filled 2016-06-24: qty 1

## 2016-06-24 MED ORDER — INSULIN ASPART 100 UNIT/ML ~~LOC~~ SOLN: 0.0000 [IU] | Freq: Three times a day (TID) | SUBCUTANEOUS | Status: DC

## 2016-06-24 MED ORDER — INSULIN ASPART 100 UNIT/ML ~~LOC~~ SOLN: 0.0000 [IU] | Freq: Every day | SUBCUTANEOUS | Status: DC

## 2016-06-24 MED ORDER — IPRATROPIUM-ALBUTEROL 0.5-2.5 (3) MG/3ML IN SOLN
3.0000 mL | Freq: Four times a day (QID) | RESPIRATORY_TRACT | Status: DC
  Administered 2016-06-24 – 2016-06-26 (×9): 3 mL via RESPIRATORY_TRACT
  Filled 2016-06-24 (×9): qty 3

## 2016-06-24 NOTE — Progress Notes (Signed)
Report called to RN on [place].

## 2016-06-24 NOTE — Progress Notes (Signed)
PULMONARY / CRITICAL CARE MEDICINE   Name: Jocelyn Myers MRN: [id number] DOB: 1965-07-28    ADMISSION DATE:  06/23/2016 CONSULTATION DATE:  06/23/2016  REFERRING MD:  ED  CHIEF COMPLAINT:  SOB/Asthma Exacerbation  HISTORY OF PRESENT ILLNESS:   Jocelyn Myers is a 51 y.o. female  with past medical history of asthma/COPD followed by pulmonology presents to the hospital with chief complaint of sob. Patient states that she has been short of breath with productive cough for 3 months. She has not been able to get in with her pulmonologist Dr. Annamaria Boots due to scheduling. Dr. Janee Morn office had called in 3 four-day tapers of prednisone along with 2 courses of  azithromycin for the patient. These have only provided the patient with momentary decrease in her symptoms but never full relief.She did not follow up with pulmonary.  Things came to a head when patient became acutely dyspneic early am 2/16.She attempted albuterol nebs at home x 2 without relief. Her husband activated EMS and patient was administered magnesium, Solu-Medrol, albuterol and Atrovent. This gave patient some relief but she was still very short of breath In the emergency room where she was placed on BiPap.She continued to be very short of breath, and wheezing despite a 30 min. Continuous neb treatment, so CCM was consulted. Lactate was 2.7, CXR clear for no active disease .Pt. Will be admitted to ICU with BiPAP 4 on 2 off.   Subjective / Interval events:  Able to come off BiPAP 2/16, has not needed this am States that she feels better, still hears some wheeze  VITAL SIGNS: BP 106/66 (BP Location: Right Arm)   Pulse 78   Temp 98.3 F (36.8 C) (Oral)   Resp 16   Ht 5\' 1"  (1.549 m)   Wt 97.5 kg (215 lb)   LMP 02/22/2011   SpO2 96%   BMI 40.62 kg/m   HEMODYNAMICS:    VENTILATOR SETTINGS: Vent Mode: BIPAP;PCV FiO2 (%):  [40 %] 40 % Set Rate:  [15 bmp] 15 bmp PEEP:  [5 cmH20] 5 cmH20  INTAKE / OUTPUT: I/O last 3 completed  shifts: In: 2946.7 [P.O.:240; I.V.:2506.7; IV Piggyback:200] Out: 2250 [Urine:2250]  PHYSICAL EXAMINATION: Physical Exam:  General- pleasant woman, comfortable ENT: mild exp stridor Cardiac:  Regular, no M Chest: few referred UA noises, no wheeze, good air movement (off BiPAP) Abd.: soft, benign Ext: no edema Neuro:  Non-focal Skin: no rash Psych: calm, pleasant, appropriate  LABS:  BMET  Recent Labs Lab 06/23/16 0630 06/23/16 0846 06/23/16 1146 06/24/16 0258  NA 138  --  139 141  K 3.8  --  3.7 4.1  CL 105  --  105 112*  CO2 23  --  20* 22  BUN 9  --  7 8  CREATININE 0.57 0.63 0.65  0.61 0.51  GLUCOSE 166*  --  170* 123*    Electrolytes  Recent Labs Lab 06/23/16 0630 06/23/16 1146 06/24/16 0258  CALCIUM 8.7* 8.6* 9.1  MG  --  2.1  --   PHOS  --  2.5  --     CBC  Recent Labs Lab 06/23/16 0846 06/23/16 1146 06/24/16 0258  WBC 11.0* 6.6 12.5*  HGB 12.0 12.7 10.7*  HCT 38.4 40.7 34.8*  PLT 185 173 162    Coag's No results for input(s): APTT, INR in the last 168 hours.  Sepsis Markers  Recent Labs Lab 06/23/16 0846 06/23/16 1146  LATICACIDVEN 2.7* 3.6*    ABG No results  for input(s): PHART, PCO2ART, PO2ART in the last 168 hours.  Liver Enzymes  Recent Labs Lab 06/23/16 1146  AST 30  ALT 31  ALKPHOS 57  BILITOT 0.6  ALBUMIN 4.1    Cardiac Enzymes  Recent Labs Lab 06/23/16 1146 06/23/16 1518 06/23/16 2143  TROPONINI 0.07* 0.09* 0.09*    Glucose  Recent Labs Lab 06/23/16 1247 06/23/16 1624 06/23/16 1943 06/23/16 2344 06/24/16 0420 06/24/16 0743  GLUCAP 195* 167* 168* 122* 136* 126*    Imaging Ct Angio Chest Pe W Or Wo Contrast  Result Date: 06/23/2016 CLINICAL DATA:  Acute on chronic respiratory failure with hypoxia. EXAM: CT ANGIOGRAPHY CHEST WITH CONTRAST TECHNIQUE: Multidetector CT imaging of the chest was performed using the standard protocol during bolus administration of intravenous contrast. Multiplanar  CT image reconstructions and MIPs were obtained to evaluate the vascular anatomy. CONTRAST:  100 cc Isovue 370 intravenous COMPARISON:  None. FINDINGS: Cardiovascular: Satisfactory opacification of the pulmonary arteries to the segmental level. No evidence of pulmonary embolism. Normal heart size. No pericardial effusion. Mediastinum/Nodes: Negative for mass or adenopathy Lungs/Pleura: There is diffuse mild airway thickening. Intermittent obscuration of the medium-sized airways due to collapse or mucoid impaction Upper Abdomen: Negative Musculoskeletal: Negative Review of the MIP images confirms the above findings. IMPRESSION: 1. Negative for pulmonary embolism. 2. Bronchial wall thickening with scattered mucoid impaction or airway collapse. Electronically Signed   By: Monte Fantasia M.D.   On: 06/23/2016 11:39   Portable Chest 1 View  Result Date: 06/24/2016 CLINICAL DATA:  Cough and congestion EXAM: PORTABLE CHEST 1 VIEW COMPARISON:  Chest radiograph and chest CT June 23, 2016 FINDINGS: Lungs are clear. Heart size and pulmonary vascularity are normal. No adenopathy. No bone lesions. IMPRESSION: No edema or consolidation. Electronically Signed   By: Lowella Grip III M.D.   On: 06/24/2016 07:42     STUDIES:  CTA 2/16>> no PE, mild diffuse bronchitis changes, some evidence bronchiolar collapse vs mucous Echo 2/16>> normal wall motion, normal diastolic fxn,normal RV fxn  CULTURES: Blood 2/16>> Urine Strep 2/16>> negative Urine Legionella 2/16>>   ANTIBIOTICS: Rocephin >> 2/16-2/16 Levofloxacin 2/16>>  SIGNIFICANT EVENTS: 06/23/2016>> Admit Asthma/COPD exacerbation  LINES/TUBES: PIV  DISCUSSION: Jocelyn Myers is a 51 y.o. female  with past medical history of asthma/COPD followed by Dr. Baird Lyons who presents to the hospital with chief complaint of sob. Patient states that she has been short of breath with productive cough for 3 months. Outpatient treatment x 4 pred taper and ABX  x 2 failed over 2 month period. She developed acute worsening 2/16, not responsive to albuterol nebs. Admitted and required BiPAP. Improved 2/17  ASSESSMENT / PLAN:  PULMONARY A: Acute on Chronic Respiratory Failure  Asthma exacerbation, suspect component VCD given longstanding sx, exam No evidence PE P:   Weaned BiPAp to off, should not require further Wean O2 as tolerated  Change solumedrol to pred 50 and plan to taper slowly Change duoneb to qid Albuterol prn Follow CXR intermittently   CARDIOVASCULAR A:  SR per Telemetry No cardiac history P:  Follow telemetry TTE reassuring 2/16  RENAL A:   No acute issues P:   Follow BMP and UOP  GASTROINTESTINAL A:   No Active Issues P:   SUP Diet to regular  HEMATOLOGIC A:   No acute issues P:  Heparin DVT prophylaxis Follow CBC intermittently   INFECTIOUS A:   Possible acute bronchitis P:   D/c IV abx, transition to PO levaquin 2/17  ENDOCRINE A:   Risk of hyperglycemia due to steroids P:   CBG and SSI    NEUROLOGIC A:   No Acute Issues P:  Follow   FAMILY  - Updates:Patient Husband and son updated at bedside. No family at bedside 2/17, plan discussed w patient  - Inter-disciplinary family meet or Palliative Care meeting due by:  06/30/2016.  Plan to floor bed on 2/17, ? To home Sunday or Monday.    Baltazar Apo, MD, PhD 06/24/2016, 9:06 AM [facility] [phone] or if no answer [phone]

## 2016-06-25 NOTE — Progress Notes (Signed)
PULMONARY / CRITICAL CARE MEDICINE   Name: Jocelyn Myers MRN: [id number] DOB: 1965/07/24    ADMISSION DATE:  06/23/2016 CONSULTATION DATE:  06/23/2016  REFERRING MD:  ED  CHIEF COMPLAINT:  SOB/Asthma Exacerbation  HISTORY OF PRESENT ILLNESS:   Jocelyn Myers is a 51 y.o. female  with past medical history of asthma/COPD followed by pulmonology presents to the hospital with chief complaint of sob. Patient states that she has been short of breath with productive cough for 3 months. She has not been able to get in with her pulmonologist Dr. Annamaria Boots due to scheduling. Dr. Janee Morn office had called in 3 four-day tapers of prednisone along with 2 courses of  azithromycin for the patient. These have only provided the patient with momentary decrease in her symptoms but never full relief.She did not follow up with pulmonary.  Things came to a head when patient became acutely dyspneic early am 2/16.She attempted albuterol nebs at home x 2 without relief. Her husband activated EMS and patient was administered magnesium, Solu-Medrol, albuterol and Atrovent. This gave patient some relief but she was still very short of breath In the emergency room where she was placed on BiPap.She continued to be very short of breath, and wheezing despite a 30 min. Continuous neb treatment, so CCM was consulted. Lactate was 2.7, CXR clear for no active disease .Pt. Will be admitted to ICU with BiPAP 4 on 2 off.   Subjective / Interval events:  Feels better Taking good Po Has ambulated  VITAL SIGNS: BP (!) 105/58 (BP Location: Left Arm)   Pulse 67   Temp 98.2 F (36.8 C) (Oral)   Resp 18   Ht 5\' 1"  (1.549 m)   Wt 97.5 kg (215 lb)   LMP 02/22/2011   SpO2 100%   BMI 40.62 kg/m   HEMODYNAMICS:    VENTILATOR SETTINGS:    INTAKE / OUTPUT: I/O last 3 completed shifts: In: 1886.7 [P.O.:330; I.V.:1556.7] Out: -   PHYSICAL EXAMINATION: Physical Exam:  General- NAD, resting  ENT: continues to have insp and exp  stridor Cardiac:  Regular, no M Chest: referred UA noise, no wheeze, good air movement Abd.: soft, NT, benign Ext: no edema Neuro:  Awake and alert, non-focal Skin: no rash  LABS:  BMET  Recent Labs Lab 06/23/16 0630 06/23/16 0846 06/23/16 1146 06/24/16 0258  NA 138  --  139 141  K 3.8  --  3.7 4.1  CL 105  --  105 112*  CO2 23  --  20* 22  BUN 9  --  7 8  CREATININE 0.57 0.63 0.65  0.61 0.51  GLUCOSE 166*  --  170* 123*    Electrolytes  Recent Labs Lab 06/23/16 0630 06/23/16 1146 06/24/16 0258  CALCIUM 8.7* 8.6* 9.1  MG  --  2.1  --   PHOS  --  2.5  --     CBC  Recent Labs Lab 06/23/16 0846 06/23/16 1146 06/24/16 0258  WBC 11.0* 6.6 12.5*  HGB 12.0 12.7 10.7*  HCT 38.4 40.7 34.8*  PLT 185 173 162    Coag's No results for input(s): APTT, INR in the last 168 hours.  Sepsis Markers  Recent Labs Lab 06/23/16 0846 06/23/16 1146  LATICACIDVEN 2.7* 3.6*    ABG No results for input(s): PHART, PCO2ART, PO2ART in the last 168 hours.  Liver Enzymes  Recent Labs Lab 06/23/16 1146  AST 30  ALT 31  ALKPHOS 57  BILITOT 0.6  ALBUMIN 4.1  Cardiac Enzymes  Recent Labs Lab 06/23/16 1146 06/23/16 1518 06/23/16 2143  TROPONINI 0.07* 0.09* 0.09*    Glucose  Recent Labs Lab 06/23/16 1247 06/23/16 1624 06/23/16 1943 06/23/16 2344 06/24/16 0420 06/24/16 0743  GLUCAP 195* 167* 168* 122* 136* 126*    Imaging No results found.   STUDIES:  CTA 2/16>> no PE, mild diffuse bronchitis changes, some evidence bronchiolar collapse vs mucous Echo 2/16>> normal wall motion, normal diastolic fxn,normal RV fxn  CULTURES: Blood 2/16>> Urine Strep 2/16>> negative Urine Legionella 2/16>>   ANTIBIOTICS: Rocephin >> 2/16-2/16 Levofloxacin 2/16>>  SIGNIFICANT EVENTS: 06/23/2016>> Admit Asthma/COPD exacerbation  LINES/TUBES: PIV  DISCUSSION: Jocelyn Myers is a 51 y.o. female  with past medical history of asthma/COPD followed by  Dr.Young who presents to the hospital with chief complaint of sob. Patient states that she has been short of breath with productive cough for 3 months. Outpatient treatment x 4 pred taper and ABX x 2 failed over 2 month period. She developed acute worsening 2/16, not responsive to albuterol nebs. Admitted and required BiPAP. Improved 2/17  ASSESSMENT / PLAN:  PULMONARY A: Acute on Chronic Respiratory Failure  Asthma exacerbation, suspect component VCD given longstanding sx, exam No evidence PE P:   No further BiPAP Wean to RA Plan taper pred over about 10 days duoneb qid, plan back to her home bevespi at discharge She is interested in getting a battery operated nebulizer for home use. ? Whether DME can provide this.  Albuterol prn   CARDIOVASCULAR A:  SR per Telemetry No cardiac history P:  Follow   RENAL A:   No acute issues P:    GASTROINTESTINAL A:   No Active Issues P:   Regular diet SUP while on steroids  HEMATOLOGIC A:   No acute issues P:  Heparin for DVT prevention   INFECTIOUS A:   Possible acute bronchitis P:   Finish course levaquin for ? Bronchitis   ENDOCRINE A:   Risk of hyperglycemia due to steroids P:   SSI, cbg    NEUROLOGIC A:   No Acute Issues P:  Follow   FAMILY  - Updates:Patient Husband and son updated at bedside. No family at bedside 2/178, plan discussed w patient  - Inter-disciplinary family meet or Palliative Care meeting due by:  06/30/2016.  Plan to home on 2/19   Baltazar Apo, MD, PhD 06/25/2016, 9:42 AM [facility] [phone] or if no answer [phone]

## 2016-06-26 ENCOUNTER — Encounter (HOSPITAL_COMMUNITY): Payer: Self-pay | Admitting: *Deleted

## 2016-06-26 DIAGNOSIS — J9600 Acute respiratory failure, unspecified whether with hypoxia or hypercapnia: Secondary | ICD-10-CM

## 2016-06-26 MED ORDER — GUAIFENESIN ER 600 MG PO TB12: 600.0000 mg | ORAL_TABLET | Freq: Two times a day (BID) | ORAL | Status: AC

## 2016-06-26 MED ORDER — BENZONATATE 200 MG PO CAPS: 200.0000 mg | ORAL_CAPSULE | Freq: Three times a day (TID) | ORAL | 0 refills | Status: DC | PRN

## 2016-06-26 MED ORDER — FLUTICASONE PROPIONATE 50 MCG/ACT NA SUSP
2.0000 | Freq: Every day | NASAL | Status: DC
  Filled 2016-06-26: qty 16

## 2016-06-26 MED ORDER — LEVOFLOXACIN 500 MG PO TABS: 500.0000 mg | ORAL_TABLET | Freq: Every day | ORAL | 0 refills | Status: DC

## 2016-06-26 MED ORDER — FLUTICASONE PROPIONATE 50 MCG/ACT NA SUSP: 2.0000 | Freq: Every day | NASAL | 2 refills | Status: AC

## 2016-06-26 MED ORDER — ALBUTEROL SULFATE (2.5 MG/3ML) 0.083% IN NEBU: 2.5000 mg | INHALATION_SOLUTION | RESPIRATORY_TRACT | 12 refills | Status: DC | PRN

## 2016-06-26 MED ORDER — PREDNISONE 10 MG PO TABS: ORAL_TABLET | ORAL | 0 refills | Status: DC

## 2016-06-26 MED ORDER — FAMOTIDINE 20 MG PO TABS: 20.0000 mg | ORAL_TABLET | Freq: Two times a day (BID) | ORAL | 12 refills | Status: DC

## 2016-06-26 NOTE — Progress Notes (Signed)
Discharge instructions (including medications) discussed with and copy provided to patient/caregiver 

## 2016-06-26 NOTE — Care Management Note (Signed)
Case Management Note  Patient Details  Name: Jocelyn Myers MRN: [id number] Date of Birth: 20-Nov-1965  Subjective/Objective:                    Action/Plan:  Patient requesting portable NEB machine. Spoke with [place] at [facility]. Instructed to ask MD for order. Order received . [facility] does not have portable NEB machine here. [facility] from [facility] will call her to arrange delivery at home. Patient voiced understanding. Expected Discharge Date:                  Expected Discharge Plan:  Home/Self Care  In-House Referral:     Discharge planning Services  CM Consult  Post Acute Care Choice:  Durable Medical Equipment Choice offered to:  Patient  DME Arranged:  [facility]eb[facility]izer/meds DME Agency:  [place]:    [place] Agency:     Status of Service:  Completed, signed off  If discussed at [place] of Stay Meetings, dates discussed:    Additional Comments:  Marilu Favre, RN 06/26/2016, 10:21 AM

## 2016-06-26 NOTE — Discharge Summary (Signed)
Physician Discharge Summary       Patient ID: Jocelyn Myers MRN: [id number] DOB/AGE: Jan 17, 1966 51 y.o.  Admit date: 06/23/2016 Discharge date: 06/26/2016  Discharge Diagnoses:   Acute hypoxic respiratory failure Asthmatic exacerbation  Vocal cord dysfunction  Acute bronchitis Sinusitis  Detailed Hospital Course:  51 year old female w/ significant h/o COPD/astma (followed by Dr Annamaria Boots for this). Admitted on 2/16 w/ what she described as cough and shortness of breath which was on-going for 3 months and not responded to 4 prednisone tapers and two rounds of antibiotics. She presented to the ER on day of admit acutely short of breath w/ acute hypoxia necessitating NIPPV. She was admitted to the pulmonary service. Treatment included: NIPPV, nebulized BDs, systemic steroids and empiric levaquin for possible bronchitis. It was also determined during this stay that there was a clear component of upper airway irritation and vocal cord dysfunction for which additional focused intervention  on nasal hygiene, GERD and cough was necessitated.     Discharge Plan by active problems  Asthmatic exacerbation c/b element of vocal cord dysfunction +/- sinusitis and acute bronchitis  Plan Home on pred taper Resume Bevespi PRN albuterol  Bid H2B levaquin x 5d total  flonase daily   Significant Hospital tests/ studies  Consults   Discharge Exam: BP (!) 98/52 (BP Location: Left Arm)   Pulse 69   Temp 97.8 F (36.6 C) (Oral)   Resp 18   Ht 5\' 1"  (1.549 m)   Wt 207 lb 0.2 oz (93.9 kg)   LMP 02/22/2011   SpO2 93%   BMI 39.11 kg/m   Room air   General appearance:  51 Year old  Female, NAD,   conversant  Eyes: anicteric sclerae icteric , moist conjunctivae; PERRL, EOMI bilaterally. Mouth:  membranes and no mucosal ulcerations; normal hard and soft palate Neck: Trachea midline; neck supple, no JVD, no upper airway wheeze  Lungs/chest: CTA, with normal respiratory effort and no intercostal  retractions CV: RRR, no MRGs  Abdomen: Soft, non-tender; no masses or HSM Extremities: No peripheral edema or extremity lymphadenopathy Neuro/Psych: Appropriate affect, alert and oriented to person, place and time   Labs at discharge Lab Results  Component Value Date   CREATININE 0.51 06/24/2016   BUN 8 06/24/2016   NA 141 06/24/2016   K 4.1 06/24/2016   CL 112 (H) 06/24/2016   CO2 22 06/24/2016   Lab Results  Component Value Date   WBC 12.5 (H) 06/24/2016   HGB 10.7 (L) 06/24/2016   HCT 34.8 (L) 06/24/2016   MCV 84.7 06/24/2016   PLT 162 06/24/2016   Lab Results  Component Value Date   ALT 31 06/23/2016   AST 30 06/23/2016   ALKPHOS 57 06/23/2016   BILITOT 0.6 06/23/2016   No results found for: INR, PROTIME  Current radiology studies No results found.  Disposition:  01-Home or Self Care  Discharge Instructions    Diet - low sodium heart healthy    Complete by:  As directed    Discharge instructions    Complete by:  As directed    You may substitute the pepcid and the flonase with over the counter version if more affordable   Increase activity slowly    Complete by:  As directed      Allergies as of 06/26/2016      Reactions   Benadryl [diphenhydramine Hcl] Hives, Shortness Of Breath      Medication List    TAKE these medications  albuterol 108 (90 Base) MCG/ACT inhaler Commonly known as:  PROAIR HFA Inhale 2 puffs into the lungs every 6 (six) hours as needed for wheezing or shortness of breath. What changed:  Another medication with the same name was changed. Make sure you understand how and when to take each.   albuterol (2.5 MG/3ML) 0.083% nebulizer solution Commonly known as:  PROVENTIL Take 3 mLs (2.5 mg total) by nebulization every 4 (four) hours as needed for wheezing or shortness of breath. What changed:  See the new instructions.   benzonatate 200 MG capsule Commonly known as:  TESSALON Take 1 capsule (200 mg total) by mouth 3 (three)  times daily as needed for cough.   famotidine 20 MG tablet Commonly known as:  PEPCID Take 1 tablet (20 mg total) by mouth 2 (two) times daily.   fluticasone 50 MCG/ACT nasal spray Commonly known as:  FLONASE Place 2 sprays into both nostrils daily.   Glycopyrrolate-Formoterol 9-4.8 MCG/ACT Aero Commonly known as:  BEVESPI AEROSPHERE Inhale 2 puffs into the lungs 2 (two) times daily.   guaiFENesin 600 MG 12 hr tablet Commonly known as:  MUCINEX Take 1 tablet (600 mg total) by mouth 2 (two) times daily.   levofloxacin 500 MG tablet Commonly known as:  LEVAQUIN Take 1 tablet (500 mg total) by mouth daily. Start taking on:  06/27/2016   predniSONE 10 MG tablet Commonly known as:  DELTASONE Take 4 tabs  daily with food x 4 days, then 3 tabs daily x 4 days, then 2 tabs daily x 4 days, then 1 tab daily x4 days then stop.            Durable Medical Equipment        Start     Ordered   06/26/16 1018  For home use only DME Nebulizer machine  Once    Question:  Patient needs a nebulizer to treat with the following condition  Answer:  Acute on chronic respiratory failure ([place])   06/26/16 1020       Discharged Condition: good  Physician Statement:   The Patient was personally examined, the discharge assessment and plan has been personally reviewed and I agree with ACNP Torrin Frein's assessment and plan. 36 minutes of time have been dedicated to discharge assessment, planning and discharge instructions.   Signed: Clementeen Graham 06/26/2016, 1:28 PM

## 2016-06-28 LAB — CULTURE, BLOOD (ROUTINE X 2)
Culture: NO GROWTH
Culture: NO GROWTH

## 2016-07-03 ENCOUNTER — Ambulatory Visit: Payer: 59 | Admitting: Internal Medicine

## 2016-07-04 ENCOUNTER — Ambulatory Visit (INDEPENDENT_AMBULATORY_CARE_PROVIDER_SITE_OTHER): Payer: BLUE CROSS/BLUE SHIELD | Admitting: Adult Health

## 2016-07-04 ENCOUNTER — Encounter: Payer: Self-pay | Admitting: Adult Health

## 2016-07-04 VITALS — BP 124/74 | HR 68 | Ht 61.0 in | Wt 202.6 lb

## 2016-07-04 DIAGNOSIS — J449 Chronic obstructive pulmonary disease, unspecified: Secondary | ICD-10-CM | POA: Diagnosis not present

## 2016-07-04 DIAGNOSIS — J301 Allergic rhinitis due to pollen: Secondary | ICD-10-CM

## 2016-07-04 DIAGNOSIS — J302 Other seasonal allergic rhinitis: Secondary | ICD-10-CM | POA: Insufficient documentation

## 2016-07-04 DIAGNOSIS — J3089 Other allergic rhinitis: Secondary | ICD-10-CM

## 2016-07-04 LAB — NITRIC OXIDE: NITRIC OXIDE: 9

## 2016-07-04 MED ORDER — MONTELUKAST SODIUM 10 MG PO TABS: 10.0000 mg | ORAL_TABLET | Freq: Every day | ORAL | 5 refills | Status: DC

## 2016-07-04 MED ORDER — BECLOMETHASONE DIPROP HFA 80 MCG/ACT IN AERB: 2.0000 | INHALATION_SPRAY | Freq: Two times a day (BID) | RESPIRATORY_TRACT | 0 refills | Status: DC

## 2016-07-04 NOTE — Patient Instructions (Addendum)
Continue on Bivespi 2 puffs Twice daily  .  Continue on Flonase daily  Continue on Singulair daily .  May use Claritin 10mg  daily As needed  Drainage .  Continue on Pepcid 10mg  Twice daily  .  Begin QVAR 80 2 puffs Twice daily   Rinse after all inhaler  Use ProAir 2 puffs every 4hr as needed. -This is your rescue inhaler .  Follow up Dr. Annamaria Boots   In 6-8 weeks and As needed

## 2016-07-04 NOTE — Assessment & Plan Note (Signed)
Cont on Flonase and Singulair   Plan  Patient Instructions  Continue on Bivespi 2 puffs Twice daily  .  Continue on Flonase daily  Continue on Singulair daily .  May use Claritin 10mg  daily As needed  Drainage .  Continue on Pepcid 10mg  Twice daily  .  Begin QVAR 80 2 puffs Twice daily   Rinse after all inhaler  Use ProAir 2 puffs every 4hr as needed. -This is your rescue inhaler .  Follow up Dr. Annamaria Boots   In 6-8 weeks and As needed

## 2016-07-04 NOTE — Progress Notes (Signed)
@Patient  ID: Jocelyn Myers, female    DOB: 1965-06-14, 51 y.o.   MRN: [id number]  Chief Complaint  Patient presents with  . Follow-up    Asthma     Referring provider: No ref. provider found  HPI: 51 yo never smoker (+second hand smoke) followed for chronic obstructive asthma   TEST  Office Spirometry-08/02/2015-severe obstructive airways disease.(Done after Xopenex nebulizer treatment) FVC 2.58/102%, FEV1 1.00/47%, FEV1/FVC 0.39, 25-75 0.41/14% A1AT nml   07/04/2016 Follow UP : Post hospital follow up -Asthma  Pt returns for a post hospital follow up . She was admitted earlier this month with Asthma exacerbation . She required BIPAP support. She improved with BD nebs and steroids . Discharged on steroid taper and Levaquin.. Instructed to take her BIVESPI everyday.  Was felt to have VCD component. Started on Pepcid Twice daily  . She was continued on Singulair and Flonase.  She returns today feeling better. Decreased cough and wheezing .  Spirometry today shows moderate obstruction with FEV1 63%, Ratio 51.  FENO is 9 .    Allergies  Allergen Reactions  . Benadryl [Diphenhydramine Hcl] Hives and Shortness Of Breath    Immunization History  Administered Date(s) Administered  . Influenza Split 05/23/2012, 02/01/2015  . Influenza,inj,Quad PF,36+ Mos 06/06/2013, 02/23/2014, 02/24/2016  . Pneumococcal Polysaccharide-23 06/06/2013, 06/24/2016  . Tdap 02/23/2014    Past Medical History:  Diagnosis Date  . Asthma   . COPD (chronic obstructive pulmonary disease) (HCC)     Tobacco History: History  Smoking Status  . Never Smoker  Smokeless Tobacco  . Never Used   Counseling given: Not Answered   Outpatient Encounter Prescriptions as of 07/04/2016  Medication Sig  . albuterol (PROAIR HFA) 108 (90 BASE) MCG/ACT inhaler Inhale 2 puffs into the lungs every 6 (six) hours as needed for wheezing or shortness of breath.  Marland Kitchen albuterol (PROVENTIL) (2.5 MG/3ML) 0.083% nebulizer  solution Take 3 mLs (2.5 mg total) by nebulization every 4 (four) hours as needed for wheezing or shortness of breath.  . benzonatate (TESSALON) 200 MG capsule Take 1 capsule (200 mg total) by mouth 3 (three) times daily as needed for cough.  . famotidine (PEPCID) 20 MG tablet Take 1 tablet (20 mg total) by mouth 2 (two) times daily.  . fluticasone (FLONASE) 50 MCG/ACT nasal spray Place 2 sprays into both nostrils daily.  . Glycopyrrolate-Formoterol (BEVESPI AEROSPHERE) 9-4.8 MCG/ACT AERO Inhale 2 puffs into the lungs 2 (two) times daily.  Marland Kitchen guaiFENesin (MUCINEX) 600 MG 12 hr tablet Take 1 tablet (600 mg total) by mouth 2 (two) times daily.  . predniSONE (DELTASONE) 10 MG tablet Take 4 tabs  daily with food x 4 days, then 3 tabs daily x 4 days, then 2 tabs daily x 4 days, then 1 tab daily x4 days then stop.  . [DISCONTINUED] levofloxacin (LEVAQUIN) 500 MG tablet Take 1 tablet (500 mg total) by mouth daily. (Patient not taking: Reported on 07/04/2016)   No facility-administered encounter medications on file as of 07/04/2016.      Review of Systems  Constitutional:   No  weight loss, night sweats,  Fevers, chills, fatigue, or  lassitude.  HEENT:   No headaches,  Difficulty swallowing,  Tooth/dental problems, or  Sore throat,                No sneezing, itching, ear ache,  +nasal congestion, post nasal drip,   CV:  No chest pain,  Orthopnea, PND, swelling in lower extremities, anasarca,  dizziness, palpitations, syncope.   GI  No heartburn, indigestion, abdominal pain, nausea, vomiting, diarrhea, change in bowel habits, loss of appetite, bloody stools.   Resp: No shortness of breath with exertion or at rest.  No excess mucus, no productive cough,  No non-productive cough,  No coughing up of blood.  No change in color of mucus.  No wheezing.  No chest wall deformity  Skin: no rash or lesions.  GU: no dysuria, change in color of urine, no urgency or frequency.  No flank pain, no hematuria    MS:  No joint pain or swelling.  No decreased range of motion.  No back pain.    Physical Exam  BP 124/74 (BP Location: Left Arm, Cuff Size: Normal)   Pulse 68   Ht 5\' 1"  (1.549 m)   Wt 202 lb 9.6 oz (91.9 kg)   LMP 02/22/2011   SpO2 100%   BMI 38.28 kg/m   GEN: A/Ox3; pleasant , NAD, well nourished    HEENT:  [place]/AT,  EACs-clear, TMs-wnl, NOSE-clear, THROAT-clear, no lesions, no postnasal drip or exudate noted.   NECK:  Supple w/ fair ROM; no JVD; normal carotid impulses w/o bruits; no thyromegaly or nodules palpated; no lymphadenopathy.    RESP  Clear  P & A; w/o, wheezes/ rales/ or rhonchi. no accessory muscle use, no dullness to percussion  CARD:  RRR, no m/r/g, no peripheral edema, pulses intact, no cyanosis or clubbing.  GI:   Soft & nt; nml bowel sounds; no organomegaly or masses detected.   Musco: Warm bil, no deformities or joint swelling noted.   Neuro: alert, no focal deficits noted.    Skin: Warm, no lesions or rashes    Lab Results:  CBC    Component Value Date/Time   WBC 12.5 (H) 06/24/2016 0258   RBC 4.11 06/24/2016 0258   HGB 10.7 (L) 06/24/2016 0258   HCT 34.8 (L) 06/24/2016 0258   PLT 162 06/24/2016 0258   MCV 84.7 06/24/2016 0258   MCH 26.0 06/24/2016 0258   MCHC 30.7 06/24/2016 0258   RDW 14.0 06/24/2016 0258   LYMPHSABS 1.0 06/24/2016 0258   MONOABS 0.2 06/24/2016 0258   EOSABS 0.0 06/24/2016 0258   BASOSABS 0.0 06/24/2016 0258    BMET    Component Value Date/Time   NA 141 06/24/2016 0258   K 4.1 06/24/2016 0258   CL 112 (H) 06/24/2016 0258   CO2 22 06/24/2016 0258   GLUCOSE 123 (H) 06/24/2016 0258   BUN 8 06/24/2016 0258   CREATININE 0.51 06/24/2016 0258   CREATININE 0.58 03/19/2012 1400   CALCIUM 9.1 06/24/2016 0258   GFRNONAA >60 06/24/2016 0258   GFRAA >60 06/24/2016 0258    BNP No results found for: BNP  ProBNP No results found for: PROBNP  Imaging: Ct Angio Chest Pe W Or Wo Contrast  Result Date:  06/23/2016 CLINICAL DATA:  Acute on chronic respiratory failure with hypoxia. EXAM: CT ANGIOGRAPHY CHEST WITH CONTRAST TECHNIQUE: Multidetector CT imaging of the chest was performed using the standard protocol during bolus administration of intravenous contrast. Multiplanar CT image reconstructions and MIPs were obtained to evaluate the vascular anatomy. CONTRAST:  100 cc Isovue 370 intravenous COMPARISON:  None. FINDINGS: Cardiovascular: Satisfactory opacification of the pulmonary arteries to the segmental level. No evidence of pulmonary embolism. Normal heart size. No pericardial effusion. Mediastinum/Nodes: Negative for mass or adenopathy Lungs/Pleura: There is diffuse mild airway thickening. Intermittent obscuration of the medium-sized airways due to collapse or mucoid  impaction Upper Abdomen: Negative Musculoskeletal: Negative Review of the MIP images confirms the above findings. IMPRESSION: 1. Negative for pulmonary embolism. 2. Bronchial wall thickening with scattered mucoid impaction or airway collapse. Electronically Signed   By: Monte Fantasia M.D.   On: 06/23/2016 11:39   Portable Chest 1 View  Result Date: 06/24/2016 CLINICAL DATA:  Cough and congestion EXAM: PORTABLE CHEST 1 VIEW COMPARISON:  Chest radiograph and chest CT June 23, 2016 FINDINGS: Lungs are clear. Heart size and pulmonary vascularity are normal. No adenopathy. No bone lesions. IMPRESSION: No edema or consolidation. Electronically Signed   By: Lowella Grip III M.D.   On: 06/24/2016 07:42   Dg Chest Portable 1 View  Result Date: 06/23/2016 CLINICAL DATA:  Shortness of breath today. EXAM: PORTABLE CHEST 1 VIEW COMPARISON:  06/06/2013 FINDINGS: The heart size and mediastinal contours are within normal limits. Both lungs are clear. The visualized skeletal structures are unremarkable. IMPRESSION: No active disease. Electronically Signed   By: Lucienne Capers M.D.   On: 06/23/2016 06:43     Assessment & Plan:   No  problem-specific Assessment & Plan notes found for this encounter.     Rexene Edison, NP 07/04/2016

## 2016-07-04 NOTE — Assessment & Plan Note (Signed)
Recent exacerbation with hospital admission now improved Will add QVAR to her current regimen  Cont w/ trigger control   Plan  Patient Instructions  Continue on Bivespi 2 puffs Twice daily  .  Continue on Flonase daily  Continue on Singulair daily .  May use Claritin 10mg  daily As needed  Drainage .  Continue on Pepcid 10mg  Twice daily  .  Begin QVAR 80 2 puffs Twice daily   Rinse after all inhaler  Use ProAir 2 puffs every 4hr as needed. -This is your rescue inhaler .  Follow up Dr. Annamaria Boots   In 6-8 weeks and As needed

## 2016-07-04 NOTE — Progress Notes (Signed)
Patient seen in the office today and instructed on use of QVAR 80.  Patient expressed understanding and demonstrated technique.  Parke Poisson, CMA 07/04/16

## 2016-07-05 ENCOUNTER — Telehealth: Payer: Self-pay | Admitting: Internal Medicine

## 2016-07-05 NOTE — Telephone Encounter (Signed)
Green folder with forms have been placed on CY's cart.

## 2016-07-06 NOTE — Telephone Encounter (Signed)
Rec'd completed forms back from [employer] to [employer] via interoffice mail - 07/06/16 -pr

## 2016-07-11 ENCOUNTER — Telehealth: Payer: Self-pay | Admitting: Internal Medicine

## 2016-07-11 NOTE — Telephone Encounter (Signed)
Forms completed and given back to [facility].

## 2016-07-12 NOTE — Telephone Encounter (Signed)
Rec'd FMLA forms back from [place] to [employer] via fax [phone]. 07/12/16-pr

## 2016-08-16 ENCOUNTER — Ambulatory Visit (INDEPENDENT_AMBULATORY_CARE_PROVIDER_SITE_OTHER): Payer: BLUE CROSS/BLUE SHIELD | Admitting: Internal Medicine

## 2016-08-16 ENCOUNTER — Encounter: Payer: Self-pay | Admitting: Internal Medicine

## 2016-08-16 DIAGNOSIS — J449 Chronic obstructive pulmonary disease, unspecified: Secondary | ICD-10-CM | POA: Diagnosis not present

## 2016-08-16 MED ORDER — GLYCOPYRROLATE-FORMOTEROL 9-4.8 MCG/ACT IN AERO: 2.0000 | INHALATION_SPRAY | Freq: Two times a day (BID) | RESPIRATORY_TRACT | 5 refills | Status: DC

## 2016-08-16 NOTE — Assessment & Plan Note (Signed)
Chronic fixed asthma/COPD. Nonallergic. Usual triggers are irritants and virus infections. Current control is good. Unclear if Qvar is helping any. Plan-continue current meds. When Qvar sample runs out, stop for reassessment.

## 2016-08-16 NOTE — Progress Notes (Signed)
HPI female never smoker/+ secondhand, followed for COPD with asthma, probably chronic fixed asthma PFT 03/10/2014-severe obstructive airways disease, no response to BD, normal TLC, normal DLCO. FVC 2.22/84%, FEV1 0.95/44%, ratio 0.43 Office Spirometry-08/02/2015-severe obstructive airways disease.(Done after Xopenex nebulizer treatment) FVC 2.58/102%, FEV1 1.00/47%, FEV1/FVC 0.39, 25-75 0.41/14% ECHO- 06/23/16- EF 65-70 percent a1AT- WNL Allergy Profile 12/05/13-total IgE normal 101 with minimal elevations for dust mite and [place] pollen. CBC  normal eosinophils --------------------------------------------------------------------------------------------------------  08/02/2015-51 year old female never smoker/ + second-hand, followed for COPD with asthma FOLLOWS FOR: Pt states she is having wheezing at this time with weather and seasonal changes Using rescue inhaler about once a day routinely now. Couldn't afford her other inhalers when she went to get them all at once. Nebulizer with albuterol makes her nervous/jittery. Not acutely ill Office Spirometry-08/02/2015-severe obstructive airways disease.(Done after Xopenex nebulizer treatment) FVC 2.58/102%, FEV1 1.00/47%, FEV1/FVC 0.39, 25-75 0.41/14%  02/24/2016-51 year old female never smoker/+ secondhand, followed for COPD with asthma FOLLOWS FOR: Pt. her breathing is doing ok, some wheezing, only using her inhaler when she needs it Pt denies sob, chest pain, or tightness,  a1AT WNL No acute exacerbation, little cough or wheeze. No change in dyspnea on exertion-chronic. Infrequent need for rescue inhaler or nebulizer.  08/16/2016-51 year old female never smoker/+ secondhand, followed for COPD with asthma LOV 07/04/2016-NP-seen after hospitalized for asthma exacerbation with VCD component. FENO only 9. FOLLOWS FOR: Pt states she feels better since OV with TP on 07/04/16; denies any wheezing,cough,congestion, or SOB. She feels well, back to baseline.  Not bothered by spring pollen season. Previous allergy evaluation had shown normal total IgE and eosinophil counts. Her wintertime trigger was a viral infection and she knows she is sensitive to irritants like smoke and strong odors. Using both S/P, Singulair, only occasional Proair rescue inhaler. Currently using a Qvar Respimat sample and can't tell if it adds any value. CT chest 06/23/2016 IMPRESSION: 1. Negative for pulmonary embolism. 2. Bronchial wall thickening with scattered mucoid impaction or airway collapse  ROS-see HPI Constitutional:   No-   weight loss, night sweats, fevers, chills, fatigue, lassitude. HEENT:   No-  headaches, difficulty swallowing, tooth/dental problems, +sore throat,       No-  sneezing, itching, ear ache, nasal congestion, post nasal drip,  CV:  No-   chest pain, orthopnea, PND, swelling in lower extremities, anasarca, dizziness, palpitations Resp: +  shortness of breath with exertion or at rest.               productive cough,  No non-productive cough,  No- coughing up of blood.              No-   change in color of mucus.  + wheezing.   Skin: No-   rash or lesions. GI:  No-   heartburn, indigestion, abdominal pain, nausea, vomiting,  GU: n. MS:  No-   joint pain or swelling.   Neuro-     nothing unusual Psych:  No- change in mood or affect. No depression or anxiety.  No memory loss.  OBJ- Physical Exam   stable baseline exam General- Alert, Oriented, Affect-appropriate, Distress- none acute, + overweight Skin- rash-none, lesions- none, excoriation- none Lymphadenopathy- none Head- atraumatic            Eyes- Gross vision intact, PERRLA, conjunctivae and secretions clear            Ears- Hearing, canals-normal            Nose- Clear, no-Septal dev,  mucus, polyps, erosion, perforation. + nasal crease             Throat- Mallampati II , mucosa clear , drainage- none, tonsils- atrophic, own teeth Neck- flexible , trachea midline, no stridor , thyroid nl,  carotid no bruit Chest - symmetrical excursion , unlabored           Heart/CV- RRR , no murmur , no gallop  , no rub, nl s1 s2                           - JVD- none , edema- none, stasis changes- none, varices- none           Lung-  clear, wheeze-none, cough-none, dullness-none, rub- none           Chest wall-  Abd-  Br/ Gen/ Rectal- Not done, not indicated Extrem- cyanosis- none, clubbing, none, atrophy- none, strength- nl Neuro- grossly intact to observation

## 2016-08-16 NOTE — Patient Instructions (Signed)
Continue Bevespi maintenance inhaler 2 puffs, twice daily  Use your ProAir rescue inhaler or nebulizer machine every 4-6 hours if needed  Use up and drop off the Qvar Respimat inhaler- see if you can teel it makes any difference for you.  Please call as needed

## 2016-08-31 ENCOUNTER — Telehealth: Payer: Self-pay | Admitting: Internal Medicine

## 2016-08-31 MED ORDER — BECLOMETHASONE DIPROP HFA 80 MCG/ACT IN AERB: 1.0000 | INHALATION_SPRAY | Freq: Two times a day (BID) | RESPIRATORY_TRACT | 5 refills | Status: DC

## 2016-08-31 MED ORDER — AZITHROMYCIN 250 MG PO TABS: ORAL_TABLET | ORAL | 0 refills | Status: DC

## 2016-08-31 MED ORDER — PREDNISONE 10 MG PO TABS: ORAL_TABLET | ORAL | 0 refills | Status: DC

## 2016-08-31 NOTE — Telephone Encounter (Signed)
Ok to order QVAR 80   INHALE 1 PUFF, THEN RINSE MOUTH, TWICE DAILY  Refill prn       She needs to know that device changed, and insurance may not cover Qvar.  Ok to send prednisone 10 mg, # 20, 4 X 2 DAYS, 3 X 2 DAYS, 2 X 2 DAYS, 1 X 2 DAYS                    Z pak  250 mg, # 6, 2 today then one daily

## 2016-08-31 NOTE — Telephone Encounter (Signed)
Called spoke with patient, advised of recommendations as stated by CY Pt okay with this and voiced her understanding Rx sent for the QVAR Redihaler - inhale 1 puff then rinse mouth, twice daily (directions confirmed with Katie), pred taper and zpak.  Pt aware to contact the office if insurance does not cover the QVAR Redihaler.  Pt aware to contact the office if her symptoms do not improve or they worsen Nothing further needed; will sign off

## 2016-08-31 NOTE — Telephone Encounter (Signed)
CY  Please Advise-  Pt called inc/o coughing with yellowish phlegm,wheezing,chest tightness, has tried mucinex but only has helped relieved slightly with the congestion, Denies fever,chest pain. She is requesting prednisone. Also she states she would like to go back on the Qvar.   Allergies  Allergen Reactions  . Benadryl [Diphenhydramine Hcl] Hives and Shortness Of Breath   .

## 2016-09-11 ENCOUNTER — Telehealth: Payer: Self-pay | Admitting: Internal Medicine

## 2016-09-11 MED ORDER — BECLOMETHASONE DIPROP HFA 80 MCG/ACT IN AERB: 2.0000 | INHALATION_SPRAY | Freq: Two times a day (BID) | RESPIRATORY_TRACT | 0 refills | Status: DC

## 2016-09-11 NOTE — Telephone Encounter (Signed)
Pt requesting alternative to qvar- this is not covered by insurance. Pt does not have formulary. Advised pt to call insurance and ask for covered alternatives, and to relay these back to our office.  Pt expressed understanding. qvar sample left up front for pickup for pt to use while we figure out what rx is covered by insurance.

## 2016-09-12 ENCOUNTER — Ambulatory Visit (INDEPENDENT_AMBULATORY_CARE_PROVIDER_SITE_OTHER): Payer: BLUE CROSS/BLUE SHIELD | Admitting: Adult Health

## 2016-09-12 ENCOUNTER — Encounter: Payer: Self-pay | Admitting: Adult Health

## 2016-09-12 DIAGNOSIS — J449 Chronic obstructive pulmonary disease, unspecified: Secondary | ICD-10-CM | POA: Diagnosis not present

## 2016-09-12 MED ORDER — PREDNISONE 10 MG PO TABS: ORAL_TABLET | ORAL | 0 refills | Status: DC

## 2016-09-12 MED ORDER — FLUTICASONE PROPIONATE HFA 220 MCG/ACT IN AERO: 2.0000 | INHALATION_SPRAY | Freq: Two times a day (BID) | RESPIRATORY_TRACT | 5 refills | Status: DC

## 2016-09-12 MED ORDER — BECLOMETHASONE DIPROP HFA 80 MCG/ACT IN AERB: 2.0000 | INHALATION_SPRAY | Freq: Two times a day (BID) | RESPIRATORY_TRACT | 0 refills | Status: DC

## 2016-09-12 MED ORDER — LEVALBUTEROL HCL 0.63 MG/3ML IN NEBU
0.6300 mg | INHALATION_SOLUTION | Freq: Once | RESPIRATORY_TRACT | Status: AC
  Administered 2016-09-12: 0.63 mg via RESPIRATORY_TRACT

## 2016-09-12 NOTE — Patient Instructions (Addendum)
Prednisone taper over next week.  Add zyrtec 10mg  At bedtime  .  Continue on Bivespi 2 puffs Twice daily  .  Continue on Flonase daily  Continue on Singulair daily .  Restart on Pepcid 10mg  Twice daily  .  Finish QVAR sample (2 puffs Twice daily  ) when done  Begin Flovent 2 puffs Twice daily  .  Rinse after all inhaler  Use ProAir 2 puffs every 4hr as needed. -This is your rescue inhaler .  Follow up Dr. Annamaria Boots   In 2-3 months and As needed   Please contact office for sooner follow up if symptoms do not improve or worsen or seek emergency care

## 2016-09-12 NOTE — Progress Notes (Signed)
@Patient  ID: Jocelyn Myers, female    DOB: 10-31-1965, 51 y.o.   MRN: [id number]  Chief Complaint  Patient presents with  . Acute Visit    Asthma    Referring provider: No ref. provider found  HPI: 51 yo never smoker (+second hand smoke) followed for chronic obstructive asthma   TEST  Office Spirometry-08/02/2015-severe obstructive airways disease.(Done after Xopenex nebulizer treatment) FVC  06/2016 Spirometry today shows moderate obstruction with FEV1 63%, Ratio 51.  FENO is 9 . ECHO- 06/23/16- EF 65-70 percent a1AT- WNL Allergy Profile 12/05/13-total IgE normal 101 with   09/12/2016 Acute OV : Asthma  Patient presents for an acute office visit. Patient complains of 2 weeks of wheezing , cough, congestion , drainage . She was called in [place] and prednisone 08/31/16  with some help but sill has cough with clear mucus and wheezing . Over last 4 days wheezing and cough have picked back up .  Finished prednisone taper yesterday . Feels prednisone really helps . We discussed bad side effects of steroids . She was recommended to restart QVAR on 4/26.but was unable to get due to not covered with insurance. She was left a sample but has not started it yet.  We called her pharmacy today and flovent is covered.  Remains on BIVESPI  Twice daily  .  She denies chest pain, hemoptysis, orthopnea, PND, or increased leg swelling.  She would like a work note today     Allergies  Allergen Reactions  . Benadryl [Diphenhydramine Hcl] Hives and Shortness Of Breath    Immunization History  Administered Date(s) Administered  . Influenza Split 05/23/2012, 02/01/2015  . Influenza,inj,Quad PF,36+ Mos 06/06/2013, 02/23/2014, 02/24/2016  . Pneumococcal Polysaccharide-23 06/06/2013, 06/24/2016  . Tdap 02/23/2014    Past Medical History:  Diagnosis Date  . Asthma   . COPD (chronic obstructive pulmonary disease) (HCC)     Tobacco History: History  Smoking Status  . Never Smoker  Smokeless  Tobacco  . Never Used   Counseling given: Not Answered   Outpatient Encounter Prescriptions as of 09/12/2016  Medication Sig  . albuterol (PROAIR HFA) 108 (90 BASE) MCG/ACT inhaler Inhale 2 puffs into the lungs every 6 (six) hours as needed for wheezing or shortness of breath.  Marland Kitchen albuterol (PROVENTIL) (2.5 MG/3ML) 0.083% nebulizer solution Take 3 mLs (2.5 mg total) by nebulization every 4 (four) hours as needed for wheezing or shortness of breath.  . Beclomethasone Diprop HFA (QVAR REDIHALER) 80 MCG/ACT AERB Inhale 1 puff into the lungs 2 (two) times daily.  . fluticasone (FLONASE) 50 MCG/ACT nasal spray Place 2 sprays into both nostrils daily.  . Glycopyrrolate-Formoterol (BEVESPI AEROSPHERE) 9-4.8 MCG/ACT AERO Inhale 2 puffs into the lungs 2 (two) times daily.  Marland Kitchen guaiFENesin (MUCINEX) 600 MG 12 hr tablet Take 1 tablet (600 mg total) by mouth 2 (two) times daily.  . montelukast (SINGULAIR) 10 MG tablet Take 1 tablet (10 mg total) by mouth at bedtime.  . Multiple Vitamin (MULTIVITAMIN) tablet Take 1 tablet by mouth daily.  . benzonatate (TESSALON) 200 MG capsule Take 1 capsule (200 mg total) by mouth 3 (three) times daily as needed for cough. (Patient not taking: Reported on 09/12/2016)  . predniSONE (DELTASONE) 10 MG tablet 4 tabs for 2 days, then 3 tabs for 2 days, 2 tabs for 2 days, then 1 tab for 2 days, then stop  . [DISCONTINUED] azithromycin (ZITHROMAX) 250 MG tablet Take as directed (Patient not taking: Reported on 09/12/2016)  . [  DISCONTINUED] Beclomethasone Diprop HFA (QVAR REDIHALER) 80 MCG/ACT AERB Inhale 2 puffs into the lungs 2 (two) times daily. (Patient not taking: Reported on 09/12/2016)  . [DISCONTINUED] predniSONE (DELTASONE) 10 MG tablet Take 4 tabs for 2 days, then 3 tabs for 2 days, 2 tabs for 2 days, then 1 tab for 2 days, then stop. (Patient not taking: Reported on 09/12/2016)   No facility-administered encounter medications on file as of 09/12/2016.      Review of  Systems  Constitutional:   No  weight loss, night sweats,  Fevers, chills, fatigue, or  lassitude.  HEENT:   No headaches,  Difficulty swallowing,  Tooth/dental problems, or  Sore throat,                No sneezing, itching, ear ache, + nasal congestion, post nasal drip,   CV:  No chest pain,  Orthopnea, PND, swelling in lower extremities, anasarca, dizziness, palpitations, syncope.   GI  No heartburn, indigestion, abdominal pain, nausea, vomiting, diarrhea, change in bowel habits, loss of appetite, bloody stools.   Resp:    No chest wall deformity  Skin: no rash or lesions.  GU: no dysuria, change in color of urine, no urgency or frequency.  No flank pain, no hematuria   MS:  No joint pain or swelling.  No decreased range of motion.  No back pain.    Physical Exam  BP 128/74 (BP Location: Left Arm, Cuff Size: Normal)   Pulse 81   Ht 5' 1.5" (1.562 m)   Wt 210 lb 6.4 oz (95.4 kg)   LMP 02/22/2011   SpO2 97%   BMI 39.11 kg/m    GEN: A/Ox3; pleasant , NAD,obese    HEENT:  [place]/AT,  EACs-clear, TMs-wnl, NOSE-clear, THROAT-clear, no lesions, no postnasal drip or exudate noted. No stridor   NECK:  Supple w/ fair ROM; no JVD; normal carotid impulses w/o bruits; no thyromegaly or nodules palpated; no lymphadenopathy.    RESP mild  Exp wheezing , + psuedowheezing upper airway , speaks in full sentences , . no accessory muscle use, no dullness to percussion  CARD:  RRR, no m/r/g, no peripheral edema, pulses intact, no cyanosis or clubbing.  GI:   Soft & nt; nml bowel sounds; no organomegaly or masses detected.   Musco: Warm bil, no deformities or joint swelling noted.   Neuro: alert, no focal deficits noted.    Skin: Warm, no lesions or rashes    Lab Results:   BNP No results found for: BNP  ProBNP No results found for: PROBNP  Imaging: No results found.   Assessment & Plan:   Asthma with COPD ([place]) Recurrent exacerbations slow to resolve - control for  triggers  Add zyrtec, pepcid  Restart ICS  xopenex neb x 1   Plan Patient Instructions  Prednisone taper over next week.  Add zyrtec 10mg  At bedtime  .  Continue on Bivespi 2 puffs Twice daily  .  Continue on Flonase daily  Continue on Singulair daily .  Restart on Pepcid 10mg  Twice daily  .  Finish QVAR sample (2 puffs Twice daily  ) when done  Begin Flovent 2 puffs Twice daily  .  Rinse after all inhaler  Use ProAir 2 puffs every 4hr as needed. -This is your rescue inhaler .  Follow up Dr. Annamaria Boots   In 2-3 months and As needed   Please contact office for sooner follow up if symptoms do not improve or worsen or seek  emergency care          Rexene Edison, NP 09/12/2016

## 2016-09-12 NOTE — Addendum Note (Signed)
Addended by: Parke Poisson E on: 09/12/2016 12:12 PM   Modules accepted: Orders

## 2016-09-12 NOTE — Addendum Note (Signed)
Addended by: Parke Poisson E on: 09/12/2016 02:26 PM   Modules accepted: Orders

## 2016-09-12 NOTE — Assessment & Plan Note (Addendum)
Recurrent exacerbations slow to resolve - control for triggers  Add zyrtec, pepcid  Restart ICS  xopenex neb x 1   Plan Patient Instructions  Prednisone taper over next week.  Add zyrtec 10mg  At bedtime  .  Continue on Bivespi 2 puffs Twice daily  .  Continue on Flonase daily  Continue on Singulair daily .  Restart on Pepcid 10mg  Twice daily  .  Finish QVAR sample (2 puffs Twice daily  ) when done  Begin Flovent 2 puffs Twice daily  .  Rinse after all inhaler  Use ProAir 2 puffs every 4hr as needed. -This is your rescue inhaler .  Follow up Dr. Annamaria Boots   In 2-3 months and As needed   Please contact office for sooner follow up if symptoms do not improve or worsen or seek emergency care

## 2016-09-15 NOTE — Telephone Encounter (Signed)
I spoke with pharmacy-appears that Flovent HFA 264mcg inhaler was sent over to the pharmacy to take the place of QVAR redi-haler that is not covered for patient. This was sent in per TP on 09-12-16 after patient was seen in office. Nothing more needed at this time.

## 2016-10-12 ENCOUNTER — Telehealth: Payer: Self-pay | Admitting: Adult Health

## 2016-10-13 NOTE — Telephone Encounter (Signed)
Jocelyn Myers please advise once this has been done. thanks

## 2016-10-18 NOTE — Telephone Encounter (Signed)
Forms completed and signed by TP Given to Patrice to forward back to [employer]

## 2016-10-18 NOTE — Telephone Encounter (Signed)
Rec'd forms back from [place] to [employer] via interoffice mail - pr

## 2016-10-18 NOTE — Telephone Encounter (Signed)
Jocelyn Myers please advise on status of FMLA forms. Thanks.

## 2016-10-30 NOTE — Telephone Encounter (Signed)
Addendum: Samples for Qvar RH 66mcg were placed up front to pick up 09/11/16. These were never picked up.  Samples have been logged back in and put back on the shelf. Nothing further needed.

## 2016-11-01 ENCOUNTER — Other Ambulatory Visit: Payer: Self-pay

## 2016-11-01 MED ORDER — GLYCOPYRROLATE-FORMOTEROL 9-4.8 MCG/ACT IN AERO: 2.0000 | INHALATION_SPRAY | Freq: Two times a day (BID) | RESPIRATORY_TRACT | 2 refills | Status: DC

## 2016-11-24 ENCOUNTER — Ambulatory Visit (INDEPENDENT_AMBULATORY_CARE_PROVIDER_SITE_OTHER): Payer: BLUE CROSS/BLUE SHIELD | Admitting: Internal Medicine

## 2016-11-24 ENCOUNTER — Encounter: Payer: Self-pay | Admitting: Internal Medicine

## 2016-11-24 DIAGNOSIS — J449 Chronic obstructive pulmonary disease, unspecified: Secondary | ICD-10-CM | POA: Diagnosis not present

## 2016-11-24 NOTE — Patient Instructions (Signed)
We can continue present meds. I am really glad you are feeling better  Please call if we can help

## 2016-11-24 NOTE — Assessment & Plan Note (Signed)
She still has significant fixed obstructive airways disease with a reactive component apparently on the basis of chronic fixed asthma. There has been some improvement in flows and hopefully this can continue as she remains well controlled. Plan-continue present therapy

## 2016-11-24 NOTE — Progress Notes (Signed)
HPI female never smoker/+ secondhand, followed for COPD with asthma, probably chronic fixed asthma PFT 03/10/2014-severe obstructive airways disease, no response to BD, normal TLC, normal DLCO. FVC 2.22/84%, FEV1 0.95/44%, ratio 0.43 Office Spirometry-08/02/2015-severe obstructive airways disease.(Done after Xopenex nebulizer treatment) FVC 2.58/102%, FEV1 1.00/47%, FEV1/FVC 0.39, 25-75 0.41/14% ECHO- 06/23/16- EF 65-70 percent a1AT- WNL Allergy Profile 12/05/13-total IgE normal 101 with minimal elevations for dust mite and [place] pollen. CBC  normal eosinophils Office Spirometry 07/04/16- moderate obstructive airways disease-FVC 2.59/100%, FEV1 1.31/63%, ratio 0.51, FEF 25-75% 0.69/30% -------------------------------------------------------------------------------------------------------.  08/16/2016-51 year old female never smoker/+ secondhand, followed for COPD with asthma LOV 07/04/2016-NP-seen after hospitalized for asthma exacerbation with VCD component. FENO only 9. FOLLOWS FOR: Pt states she feels better since OV with TP on 07/04/16; denies any wheezing,cough,congestion, or SOB. She feels well, back to baseline. Not bothered by spring pollen season. Previous allergy evaluation had shown normal total IgE and eosinophil counts. Her wintertime trigger was a viral infection and she knows she is sensitive to irritants like smoke and strong odors. Using both S/P, Singulair, only occasional Proair rescue inhaler. Currently using a Qvar Respimat sample and can't tell if it adds any value. CT chest 06/23/2016 IMPRESSION: 1. Negative for pulmonary embolism. 2. Bronchial wall thickening with scattered mucoid impaction or airway collapse  11/24/16- 51 year old female never smoker/+ secondhand, followed for COPD with asthma FOLLOWS FOR: Pt states she has slight wheezing and SOB at times but feels much better since last OV with TP on 09-12-16. LOV NP added Pepcid, changed Qvar to Flovent. Continued  Bevespi She has no sensation of reflux or heartburn but does say she is taking Pepcid. No recent wheezing and no recent need for rescue inhaler. This is usually a good time of year for her.  ROS-see HPI   += pos Constitutional:   No-   weight loss, night sweats, fevers, chills, fatigue, lassitude. HEENT:   No-  headaches, difficulty swallowing, tooth/dental problems, +sore throat,       No-  sneezing, itching, ear ache, nasal congestion, post nasal drip,  CV:  No-   chest pain, orthopnea, PND, swelling in lower extremities, anasarca, dizziness, palpitations Resp: +  shortness of breath with exertion or at rest.               productive cough,  No non-productive cough,  No- coughing up of blood.              No-   change in color of mucus.  + wheezing.   Skin: No-   rash or lesions. GI:  No-   heartburn, indigestion, abdominal pain, nausea, vomiting,  GU: n. MS:  No-   joint pain or swelling.   Neuro-     nothing unusual Psych:  No- change in mood or affect. No depression or anxiety.  No memory loss.  OBJ- Physical Exam   stable baseline exam General- Alert, Oriented, Affect-appropriate, Distress- none acute, + overweight Skin- rash-none, lesions- none, excoriation- none Lymphadenopathy- none Head- atraumatic            Eyes- Gross vision intact, PERRLA, conjunctivae and secretions clear            Ears- Hearing, canals-normal            Nose- Clear, no-Septal dev, mucus, polyps, erosion, perforation. + nasal crease             Throat- Mallampati II , mucosa clear , drainage- none, tonsils- atrophic, own teeth Neck- flexible , trachea midline,  no stridor , thyroid nl, carotid no bruit Chest - symmetrical excursion , unlabored           Heart/CV- RRR , no murmur , no gallop  , no rub, nl s1 s2                           - JVD- none , edema- none, stasis changes- none, varices- none           Lung-  clear, wheeze-none, cough-none, dullness-none, rub- none           Chest wall-  Abd-   Br/ Gen/ Rectal- Not done, not indicated Extrem- cyanosis- none, clubbing, none, atrophy- none, strength- nl Neuro- grossly intact to observation

## 2016-11-27 ENCOUNTER — Other Ambulatory Visit: Payer: Self-pay | Admitting: Adult Health

## 2017-02-15 ENCOUNTER — Ambulatory Visit (INDEPENDENT_AMBULATORY_CARE_PROVIDER_SITE_OTHER): Payer: BLUE CROSS/BLUE SHIELD | Admitting: Internal Medicine

## 2017-02-15 ENCOUNTER — Encounter: Payer: Self-pay | Admitting: Internal Medicine

## 2017-02-15 DIAGNOSIS — J449 Chronic obstructive pulmonary disease, unspecified: Secondary | ICD-10-CM | POA: Diagnosis not present

## 2017-02-15 DIAGNOSIS — J302 Other seasonal allergic rhinitis: Secondary | ICD-10-CM | POA: Diagnosis not present

## 2017-02-15 DIAGNOSIS — J3089 Other allergic rhinitis: Secondary | ICD-10-CM

## 2017-02-15 DIAGNOSIS — J4489 Other specified chronic obstructive pulmonary disease: Secondary | ICD-10-CM

## 2017-02-15 DIAGNOSIS — Z23 Encounter for immunization: Secondary | ICD-10-CM

## 2017-02-15 MED ORDER — BECLOMETHASONE DIPROPIONATE 80 MCG/ACT IN AERS: INHALATION_SPRAY | RESPIRATORY_TRACT | 12 refills | Status: DC

## 2017-02-15 NOTE — Patient Instructions (Signed)
We can continue present meds.  Glad you are doing well. Please call if we can help.  Flu vax

## 2017-02-15 NOTE — Assessment & Plan Note (Signed)
Minor nasal stuffiness and drainage with seasonal changes but good overall control. Watching relationship of nasal symptoms to asthma.

## 2017-02-15 NOTE — Progress Notes (Signed)
HPI female never smoker/+ secondhand, followed for COPD with asthma, probably chronic fixed asthma PFT 03/10/2014-severe obstructive airways disease, no response to BD, normal TLC, normal DLCO. FVC 2.22/84%, FEV1 0.95/44%, ratio 0.43 Office Spirometry-08/02/2015-severe obstructive airways disease.(Done after Xopenex nebulizer treatment) FVC 2.58/102%, FEV1 1.00/47%, FEV1/FVC 0.39, 25-75 0.41/14% ECHO- 06/23/16- EF 65-70 percent a1AT- WNL Allergy Profile 12/05/13-total IgE normal 101 with minimal elevations for dust mite and [place] pollen. CBC  normal eosinophils Office Spirometry 07/04/16- moderate obstructive airways disease-FVC 2.59/100%, FEV1 1.31/63%, ratio 0.51, FEF 25-75% 0.69/30% -------------------------------------------------------------------------------------------------------.  11/24/16- 51 year old female never smoker/+ secondhand, followed for COPD with asthma FOLLOWS FOR: Pt states she has slight wheezing and SOB at times but feels much better since last OV with TP on 09-12-16. LOV NP added Pepcid, changed Qvar to Flovent. Continued Bevespi She has no sensation of reflux or heartburn but does say she is taking Pepcid. No recent wheezing and no recent need for rescue inhaler. This is usually a good time of year for her.  02/15/17- 51 year old female never smoker/+ secondhand, followed for COPD with asthma Asthma; pt denies any wheezing, SOB, cough, or congestion. Doing well overall.  She is doing well over this last 6 months. 1 cold but no sustained or difficult asthma. Has not recently needed rescue inhaler or experienced sleep disturbance from her breathing. She is satisfied with current meds which we reviewed. Asks flu shot.  ROS-see HPI   + = positive Constitutional:   No-   weight loss, night sweats, fevers, chills, fatigue, lassitude. HEENT:   No-  headaches, difficulty swallowing, tooth/dental problems, +sore throat,       No-  sneezing, itching, ear ache, nasal congestion, post  nasal drip,  CV:  No-   chest pain, orthopnea, PND, swelling in lower extremities, anasarca, dizziness, palpitations Resp: +  shortness of breath with exertion or at rest.               productive cough,  No non-productive cough,  No- coughing up of blood.              No-   change in color of mucus.  + wheezing.   Skin: No-   rash or lesions. GI:  No-   heartburn, indigestion, abdominal pain, nausea, vomiting,  GU: n. MS:  No-   joint pain or swelling.   Neuro-     nothing unusual Psych:  No- change in mood or affect. No depression or anxiety.  No memory loss.  OBJ- Physical Exam    General- Alert, Oriented, Affect-appropriate, Distress- none acute, + overweight Skin- rash-none, lesions- none, excoriation- none Lymphadenopathy- none Head- atraumatic            Eyes- Gross vision intact, PERRLA, conjunctivae and secretions clear            Ears- Hearing, canals-normal            Nose- Clear, no-Septal dev, mucus, polyps, erosion, perforation. + nasal crease             Throat- Mallampati II , mucosa clear , drainage- none, tonsils- atrophic, own teeth Neck- flexible , trachea midline, no stridor , thyroid nl, carotid no bruit Chest - symmetrical excursion , unlabored           Heart/CV- RRR , no murmur , no gallop  , no rub, nl s1 s2                           -  JVD- none , edema- none, stasis changes- none, varices- none           Lung-  clear, wheeze-none, cough-none, dullness-none, rub- none           Chest wall-  Abd-  Br/ Gen/ Rectal- Not done, not indicated Extrem- cyanosis- none, clubbing, none, atrophy- none, strength- nl Neuro- grossly intact to observation

## 2017-02-15 NOTE — Assessment & Plan Note (Signed)
She describes good symptom control with stable use of maintenance medications and no recent need for rescue inhaler. No recent change. Plan-continue current meds. Flu shot.

## 2017-05-18 ENCOUNTER — Other Ambulatory Visit: Payer: Self-pay | Admitting: Internal Medicine

## 2017-05-18 MED ORDER — MONTELUKAST SODIUM 10 MG PO TABS: 10.0000 mg | ORAL_TABLET | Freq: Every day | ORAL | 3 refills | Status: DC

## 2017-05-29 ENCOUNTER — Encounter: Payer: Self-pay | Admitting: Internal Medicine

## 2017-05-29 ENCOUNTER — Ambulatory Visit (INDEPENDENT_AMBULATORY_CARE_PROVIDER_SITE_OTHER): Payer: BLUE CROSS/BLUE SHIELD | Admitting: Internal Medicine

## 2017-05-29 DIAGNOSIS — J449 Chronic obstructive pulmonary disease, unspecified: Secondary | ICD-10-CM | POA: Diagnosis not present

## 2017-05-29 DIAGNOSIS — J069 Acute upper respiratory infection, unspecified: Secondary | ICD-10-CM

## 2017-05-29 NOTE — Assessment & Plan Note (Signed)
Viral syndrome.  Understands supportive care, hydration and avoiding chilling in this cold weather.

## 2017-05-29 NOTE — Progress Notes (Signed)
HPI female never smoker/+ secondhand, followed for COPD with asthma, probably chronic fixed asthma PFT 03/10/2014-severe obstructive airways disease, no response to BD, normal TLC, normal DLCO. FVC 2.22/84%, FEV1 0.95/44%, ratio 0.43 Office Spirometry-08/02/2015-severe obstructive airways disease.(Done after Xopenex nebulizer treatment) FVC 2.58/102%, FEV1 1.00/47%, FEV1/FVC 0.39, 25-75 0.41/14% ECHO- 06/23/16- EF 65-70 percent a1AT- WNL Allergy Profile 12/05/13-total IgE normal 101 with minimal elevations for dust mite and [place] pollen. CBC  normal eosinophils Office Spirometry 07/04/16- moderate obstructive airways disease-FVC 2.59/100%, FEV1 1.31/63%, ratio 0.51, FEF 25-75% 0.69/30% -------------------------------------------------------------------------------------------------------.  02/15/17- 52 year old female never smoker/+ secondhand, followed for COPD with asthma Asthma; pt denies any wheezing, SOB, cough, or congestion. Doing well overall.  She is doing well over this last 6 months. 1 cold but no sustained or difficult asthma. Has not recently needed rescue inhaler or experienced sleep disturbance from her breathing. She is satisfied with current meds which we reviewed. Asks flu shot.  05/29/17- 52 year old female never smoker/+ secondhand, followed for COPD with asthma, allergic rhinitis Probably chronic fixed asthma. ----Pt having sore throat, horseness, chills, congestion, dry cough for x 3 days. Pt having some wheezing used rescue inhaler once daily since Sunday.  Albuterol HFA, nebulizer albuterol, Qvar 80, Tessalon Perles, Bevespi, Singulair She has caught a cold from children, but no fever and nothing purulent.  Discussed management of viral URI syndrome. Otherwise has felt stable.  Does not feel she needs Bevespi all the time and we discussed.  Had flu shot.  ROS-see HPI   + = positive Constitutional:   No-   weight loss, night sweats, fevers, chills, fatigue,  lassitude. HEENT:   No-  headaches, difficulty swallowing, tooth/dental problems, +sore throat,       No-  sneezing, itching, ear ache, nasal congestion, post nasal drip,  CV:  No-   chest pain, orthopnea, PND, swelling in lower extremities, anasarca, dizziness, palpitations Resp: +  shortness of breath with exertion or at rest.               productive cough,  No non-productive cough,  No- coughing up of blood.              No-   change in color of mucus.  + wheezing.   Skin: No-   rash or lesions. GI:  No-   heartburn, indigestion, abdominal pain, nausea, vomiting,  GU: n. MS:  No-   joint pain or swelling.   Neuro-     nothing unusual Psych:  No- change in mood or affect. No depression or anxiety.  No memory loss.  OBJ- Physical Exam    General- Alert, Oriented, Affect-appropriate, Distress- none acute, + overweight Skin- rash-none, lesions- none, excoriation- none Lymphadenopathy- none Head- atraumatic            Eyes- Gross vision intact, PERRLA, conjunctivae and secretions clear            Ears- Hearing, canals-normal            Nose- Clear, no-Septal dev, mucus, polyps, erosion, perforation. + nasal crease             Throat- Mallampati III , mucosa+ red , drainage- none, tonsils- atrophic, own teeth Neck- flexible , trachea midline, no stridor , thyroid nl, carotid no bruit Chest - symmetrical excursion , unlabored           Heart/CV- RRR , no murmur , no gallop  , no rub, nl s1 s2                           -  JVD- none , edema- none, stasis changes- none, varices- none           Lung-  clear, wheeze-none, cough-none, dullness-none, rub- none           Chest wall-  Abd-  Br/ Gen/ Rectal- Not done, not indicated Extrem- cyanosis- none, clubbing, none, atrophy- none, strength- nl Neuro- grossly intact to observation

## 2017-05-29 NOTE — Assessment & Plan Note (Signed)
Her worst season is generally wintertime related to viral infections.  I recommended she not slow down Bevespi for now.  When feeling well controlled can use it just once daily.

## 2017-05-29 NOTE — Patient Instructions (Addendum)
Can continue current meds. It would be ok to slow Bevespi down to once daily while feeling fairly well, and back up to twice daily when you see trouble coming, like an early cold.  Please call for refills and our help as needed.

## 2017-08-16 ENCOUNTER — Ambulatory Visit (INDEPENDENT_AMBULATORY_CARE_PROVIDER_SITE_OTHER): Payer: BLUE CROSS/BLUE SHIELD | Admitting: Internal Medicine

## 2017-08-16 ENCOUNTER — Ambulatory Visit (INDEPENDENT_AMBULATORY_CARE_PROVIDER_SITE_OTHER)
Admission: RE | Admit: 2017-08-16 | Discharge: 2017-08-16 | Disposition: A | Discharge status: Home / Self Care | Payer: BLUE CROSS/BLUE SHIELD | Source: Ambulatory Visit | Attending: Internal Medicine | Admitting: Internal Medicine

## 2017-08-16 ENCOUNTER — Encounter: Payer: Self-pay | Admitting: Internal Medicine

## 2017-08-16 VITALS — BP 118/66 | HR 70 | Ht 61.0 in | Wt 204.6 lb

## 2017-08-16 DIAGNOSIS — J3089 Other allergic rhinitis: Secondary | ICD-10-CM

## 2017-08-16 DIAGNOSIS — J449 Chronic obstructive pulmonary disease, unspecified: Secondary | ICD-10-CM

## 2017-08-16 DIAGNOSIS — J302 Other seasonal allergic rhinitis: Secondary | ICD-10-CM | POA: Diagnosis not present

## 2017-08-16 MED ORDER — FLUTICASONE-UMECLIDIN-VILANT 100-62.5-25 MCG/INH IN AEPB: 1.0000 | INHALATION_SPRAY | Freq: Every day | RESPIRATORY_TRACT | 0 refills | Status: DC

## 2017-08-16 MED ORDER — FLUTICASONE-UMECLIDIN-VILANT 100-62.5-25 MCG/INH IN AEPB: 1.0000 | INHALATION_SPRAY | Freq: Every day | RESPIRATORY_TRACT | 11 refills | Status: DC

## 2017-08-16 NOTE — Patient Instructions (Signed)
Order- CXR    Dx COPD mixed type  Sample and print script for Trelegy inhaler     Inhale 1 puff,once daily     Try this instead of Bevespi and Qvar    If you like the Trelegy, you can take the script to your pharmacy and see how much it will cost. Otherwise, just go back to using Bevespi and Qvar. Let us know when you need refills.  Please call if we can help

## 2017-08-16 NOTE — Progress Notes (Signed)
HPI female never smoker/+ secondhand, followed for COPD with asthma, probably chronic fixed asthma PFT 03/10/2014-severe obstructive airways disease, no response to BD, normal TLC, normal DLCO. FVC 2.22/84%, FEV1 0.95/44%, ratio 0.43 Office Spirometry-08/02/2015-severe obstructive airways disease.(Done after Xopenex nebulizer treatment) FVC 2.58/102%, FEV1 1.00/47%, FEV1/FVC 0.39, 25-75 0.41/14% ECHO- 06/23/16- EF 65-70 percent a1AT- WNL Allergy Profile 12/05/13-total IgE normal 101 with minimal elevations for dust mite and [place] pollen. CBC  normal eosinophils Office Spirometry 07/04/16- moderate obstructive airways disease-FVC 2.59/100%, FEV1 1.31/63%, ratio 0.51, FEF 25-75% 0.69/30% -------------------------------------------------------------------------------------------------------. 05/29/17- 52 year old female never smoker/+ secondhand, followed for COPD with asthma, allergic rhinitis Probably chronic fixed asthma. ----Pt having sore throat, horseness, chills, congestion, dry cough for x 3 days. Pt having some wheezing used rescue inhaler once daily since Sunday.  Albuterol HFA, nebulizer albuterol, Qvar 80, Tessalon Perles, Bevespi, Singulair She has caught a cold from children, but no fever and nothing purulent.  Discussed management of viral URI syndrome. Otherwise has felt stable.  Does not feel she needs Bevespi all the time and we discussed.  Had flu shot.  08/16/17-  51 year old female never smoker/+ secondhand, followed for COPD with asthma, allergic rhinitis Probably chronic fixed asthma. Albuterol HFA, nebulizer albuterol, Qvar 80, Tessalon Perles, Bevespi, Singulair ----Asthma with COPD: Pt states the pollen is bothering her breathing lately. Little wheeze, lungs "okay" aware of some dyspnea on hills and stairs rarely impacting ADLs. Mild seasonal increase in pollen related irritation of eyes and nose with drainage, itching, sneezing. Patient seen in the office today and  instructed on use of Trelegy.  Patient expressed understanding and demonstrated technique. [name],CMA   ROS-see HPI   + = positive Constitutional:   No-   weight loss, night sweats, fevers, chills, fatigue, lassitude. HEENT:   No-  headaches, difficulty swallowing, tooth/dental problems, +sore throat,       +sneezing, itching, ear ache, +nasal congestion, post nasal drip,  CV:  No-   chest pain, orthopnea, PND, swelling in lower extremities, anasarca, dizziness, palpitations Resp: +  shortness of breath with exertion or at rest.               productive cough,  No non-productive cough,  No- coughing up of blood.              No-   change in color of mucus.  + wheezing.   Skin: No-   rash or lesions. GI:  No-   heartburn, indigestion, abdominal pain, nausea, vomiting,  GU: n. MS:  No-   joint pain or swelling.   Neuro-     nothing unusual Psych:  No- change in mood or affect. No depression or anxiety.  No memory loss.  OBJ- Physical Exam    General- Alert, Oriented, Affect-appropriate, Distress- none acute, + overweight Skin- rash-none, lesions- none, excoriation- none Lymphadenopathy- none Head- atraumatic            Eyes- Gross vision intact, PERRLA, conjunctivae and secretions clear            Ears- Hearing, canals-normal            Nose- Clear, no-Septal dev, mucus, polyps, erosion, perforation. + nasal crease             Throat- Mallampati III , mucosa+ red , drainage- none, tonsils- atrophic, own teeth Neck- flexible , trachea midline, no stridor , thyroid nl, carotid no bruit Chest - symmetrical excursion , unlabored  Heart/CV- RRR , no murmur , no gallop  , no rub, nl s1 s2                           - JVD- none , edema- none, stasis changes- none, varices- none           Lung-  Clear/stent, wheeze-none, cough-none, dullness-none, rub- none           Chest wall-  Abd-  Br/ Gen/ Rectal- Not done, not indicated Extrem- cyanosis- none, clubbing, none, atrophy-  none, strength- nl Neuro- grossly intact to observation

## 2017-08-19 NOTE — Assessment & Plan Note (Addendum)
Fixed obstructive airways disease attributed to secondhand smoke exposure and history of asthma.  No acute exacerbation but I would like to do better. Plan-try Trelegy, update CXR

## 2017-08-19 NOTE — Assessment & Plan Note (Signed)
Seasonal exacerbation reflects heavy pollen now-mostly tree pollens. Plan-pollen avoidance.  Flonase/Claritin or equivalents discussed.

## 2017-11-27 ENCOUNTER — Ambulatory Visit (INDEPENDENT_AMBULATORY_CARE_PROVIDER_SITE_OTHER): Payer: BLUE CROSS/BLUE SHIELD | Admitting: Internal Medicine

## 2017-11-27 ENCOUNTER — Encounter: Payer: Self-pay | Admitting: Internal Medicine

## 2017-11-27 DIAGNOSIS — J3089 Other allergic rhinitis: Secondary | ICD-10-CM

## 2017-11-27 DIAGNOSIS — J302 Other seasonal allergic rhinitis: Secondary | ICD-10-CM | POA: Diagnosis not present

## 2017-11-27 DIAGNOSIS — J4489 Other specified chronic obstructive pulmonary disease: Secondary | ICD-10-CM

## 2017-11-27 DIAGNOSIS — J449 Chronic obstructive pulmonary disease, unspecified: Secondary | ICD-10-CM | POA: Diagnosis not present

## 2017-11-27 NOTE — Patient Instructions (Signed)
We can continue current meds  Please cal if we can help 

## 2017-11-27 NOTE — Assessment & Plan Note (Signed)
Not having significant rhinitis symptoms currently.  We will pay attention as late summer and early fall pollens return.

## 2017-11-27 NOTE — Assessment & Plan Note (Signed)
Minimal symptoms currently, only cough.  Clinically stable and finds Trelegy helpful.  We may not need to do more for now, but can consider reassessing for Biologics at next visit.

## 2017-11-27 NOTE — Progress Notes (Signed)
HPI female never smoker/+ secondhand, followed for COPD with asthma, probably chronic fixed asthma PFT 03/10/2014-severe obstructive airways disease, no response to BD, normal TLC, normal DLCO. FVC 2.22/84%, FEV1 0.95/44%, ratio 0.43 Office Spirometry-08/02/2015-severe obstructive airways disease.(Done after Xopenex nebulizer treatment) FVC 2.58/102%, FEV1 1.00/47%, FEV1/FVC 0.39, 25-75 0.41/14% ECHO- 06/23/16- EF 65-70 percent a1AT- WNL Allergy Profile 12/05/13-total IgE normal 101 with minimal elevations for dust mite and [place] pollen. CBC  normal eosinophils Office Spirometry 07/04/16- moderate obstructive airways disease-FVC 2.59/100%, FEV1 1.31/63%, ratio 0.51, FEF 25-75% 0.69/30% -------------------------------------------------------------------------------------------------------.  08/16/17-  52 year old female never smoker/+ secondhand, followed for COPD with asthma, allergic rhinitis Probably chronic fixed asthma. Albuterol HFA, nebulizer albuterol, Qvar 80, Tessalon Perles, Bevespi, Singulair ----Asthma with COPD: Pt states the pollen is bothering her breathing lately. Little wheeze, lungs "okay" aware of some dyspnea on hills and stairs rarely impacting ADLs. Mild seasonal increase in pollen related irritation of eyes and nose with drainage, itching, sneezing. Patient seen in the office today and instructed on use of Trelegy.  Patient expressed understanding and demonstrated technique. Blair Hailey   11/27/2017- 52 year old female never smoker/+ secondhand, followed for COPD with asthma, allergic rhinitis -----COPD mixed type: Pt has slight cough-non productive. Denies any wheezing, SOB, or congestion. States Trelegy is working well for her.  Trelegy, Singulair, pro-air HFA, neb albuterol, Flonase She feels she is doing well with Trelegy but admits this is a good time of year anyway.  She expects more problems in late fall-November/December, suggesting viral issues then.  Admits  frequent cough usually scant clear sputum.  Uses cough drops.  Little wheeze.  Denies dyspnea with ADLs, only with "exercise". CXR 08/16/2017- IMPRESSION: No active cardiopulmonary disease.  ROS-see HPI   + = positive Constitutional:   No-   weight loss, night sweats, fevers, chills, fatigue, lassitude. HEENT:   No-  headaches, difficulty swallowing, tooth/dental problems, +sore throat,       sneezing, itching, ear ache, +nasal congestion, post nasal drip,  CV:  No-   chest pain, orthopnea, PND, swelling in lower extremities, anasarca, dizziness, palpitations Resp: +  shortness of breath with exertion or at rest.               productive cough,  No non-productive cough,  No- coughing up of blood.              No-   change in color of mucus.  + wheezing.   Skin: No-   rash or lesions. GI:  No-   heartburn, indigestion, abdominal pain, nausea, vomiting,  GU: n. MS:  No-   joint pain or swelling.   Neuro-     nothing unusual Psych:  No- change in mood or affect. No depression or anxiety.  No memory loss.  OBJ- Physical Exam    General- Alert, Oriented, Affect-appropriate, Distress- none acute, + overweight Skin- rash-none, lesions- none, excoriation- none Lymphadenopathy- none Head- atraumatic            Eyes- Gross vision intact, PERRLA, conjunctivae and secretions clear            Ears- Hearing, canals-normal            Nose- Clear, no-Septal dev, mucus, polyps, erosion, perforation. + nasal crease             Throat- Mallampati III , mucosa+ red , drainage- none, tonsils- atrophic, own teeth Neck- flexible , trachea midline, no stridor , thyroid nl, carotid no bruit Chest - symmetrical excursion , unlabored  Heart/CV- RRR , no murmur , no gallop  , no rub, nl s1 s2                           - JVD- none , edema- none, stasis changes- none, varices- none           Lung-  Clear, wheeze-none, cough-none, dullness-none, rub- none           Chest wall-  Abd-  Br/ Gen/ Rectal-  Not done, not indicated Extrem- cyanosis- none, clubbing, none, atrophy- none, strength- nl Neuro- grossly intact to observation

## 2018-02-15 ENCOUNTER — Encounter: Payer: Self-pay | Admitting: Internal Medicine

## 2018-02-15 ENCOUNTER — Ambulatory Visit (INDEPENDENT_AMBULATORY_CARE_PROVIDER_SITE_OTHER): Payer: BLUE CROSS/BLUE SHIELD | Admitting: Internal Medicine

## 2018-02-15 VITALS — BP 116/66 | HR 63 | Ht 61.0 in | Wt 211.4 lb

## 2018-02-15 DIAGNOSIS — J449 Chronic obstructive pulmonary disease, unspecified: Secondary | ICD-10-CM

## 2018-02-15 DIAGNOSIS — Z23 Encounter for immunization: Secondary | ICD-10-CM

## 2018-02-15 DIAGNOSIS — J4489 Other specified chronic obstructive pulmonary disease: Secondary | ICD-10-CM

## 2018-02-15 DIAGNOSIS — J302 Other seasonal allergic rhinitis: Secondary | ICD-10-CM | POA: Diagnosis not present

## 2018-02-15 DIAGNOSIS — J3089 Other allergic rhinitis: Secondary | ICD-10-CM | POA: Diagnosis not present

## 2018-02-15 NOTE — Patient Instructions (Signed)
Order- flu vax standard  Try using the Trelegy inhaler regularly for 2-3 weeks. If you can't teel any real help for your breathing, then ok to set it aside, and maybe add it if you have a bad stretch this winter, like a chest cold.  Please call if we can help

## 2018-02-15 NOTE — Progress Notes (Signed)
HPI female never smoker/+ secondhand, followed for COPD with asthma, probably chronic fixed asthma PFT 03/10/2014-severe obstructive airways disease, no response to BD, normal TLC, normal DLCO. FVC 2.22/84%, FEV1 0.95/44%, ratio 0.43 Office Spirometry-08/02/2015-severe obstructive airways disease.(Done after Xopenex nebulizer treatment) FVC 2.58/102%, FEV1 1.00/47%, FEV1/FVC 0.39, 25-75 0.41/14% ECHO- 06/23/16- EF 65-70 percent a1AT- WNL Allergy Profile 12/05/13-total IgE normal 101 with minimal elevations for dust mite and [place] pollen. CBC  normal eosinophils Office Spirometry 07/04/16- moderate obstructive airways disease-FVC 2.59/100%, FEV1 1.31/63%, ratio 0.51, FEF 25-75% 0.69/30% -------------------------------------------------------------------------------------------------------.  11/27/2017- 52 year old female never smoker/+ secondhand, followed for COPD with asthma, allergic rhinitis -----COPD mixed type: Pt has slight cough-non productive. Denies any wheezing, SOB, or congestion. States Trelegy is working well for her.  Trelegy, Singulair, pro-air HFA, neb albuterol, Flonase She feels she is doing well with Trelegy but admits this is a good time of year anyway.  She expects more problems in late fall-November/December, suggesting viral issues then.  Admits frequent cough usually scant clear sputum.  Uses cough drops.  Little wheeze.  Denies dyspnea with ADLs, only with "exercise". CXR 08/16/2017- IMPRESSION: No active cardiopulmonary disease.  02/15/2018- 52 year old female never smoker/+ secondhand, followed for COPD with asthma, allergic rhinitis Trelegy sample, Singulair, pro-air HFA, Flonase, nebulizer albuterol,  She has part of a Trelegy sample and one that she bought, but has been using it only occasionally.  I discussed the concept of a maintenance inhaler.  No recent use of albuterol or her nebulizer.  She has little cough or wheeze and no acute events.  ROS-see HPI   + =  positive Constitutional:   No-   weight loss, night sweats, fevers, chills, fatigue, lassitude. HEENT:   No-  headaches, difficulty swallowing, tooth/dental problems, +sore throat,       sneezing, itching, ear ache, +nasal congestion, post nasal drip,  CV:  No-   chest pain, orthopnea, PND, swelling in lower extremities, anasarca, dizziness, palpitations Resp: +  shortness of breath with exertion or at rest.               productive cough,  No non-productive cough,  No- coughing up of blood.              No-   change in color of mucus.  + wheezing.   Skin: No-   rash or lesions. GI:  No-   heartburn, indigestion, abdominal pain, nausea, vomiting,  GU: n. MS:  No-   joint pain or swelling.   Neuro-     nothing unusual Psych:  No- change in mood or affect. No depression or anxiety.  No memory loss.  OBJ- Physical Exam    General- Alert, Oriented, Affect-appropriate, Distress- none acute, + overweight Skin- rash-none, lesions- none, excoriation- none Lymphadenopathy- none Head- atraumatic            Eyes- Gross vision intact, PERRLA, conjunctivae and secretions clear            Ears- Hearing, canals-normal            Nose- Clear, no-Septal dev, mucus, polyps, erosion, perforation. + nasal crease             Throat- Mallampati III , mucosa-, drainage- none, tonsils- atrophic, own teeth Neck- flexible , trachea midline, no stridor , thyroid nl, carotid no bruit Chest - symmetrical excursion , unlabored           Heart/CV- RRR , no murmur , no gallop  , no rub, nl s1 s2                           -  JVD- none , edema- none, stasis changes- none, varices- none           Lung-  Clear/ quiet, wheeze-none, cough-none, dullness-none, rub- none           Chest wall-  Abd-  Br/ Gen/ Rectal- Not done, not indicated Extrem- cyanosis- none, clubbing, none, atrophy- none, strength- nl Neuro- grossly intact to observation

## 2018-02-15 NOTE — Assessment & Plan Note (Signed)
Minimal rhinitis symptoms through the summer and fall.  Flonase and Claritin would be sufficient if needed.

## 2018-02-15 NOTE — Assessment & Plan Note (Signed)
She paces herself to manage dyspnea and has minimal asthma/bronchitis symptoms currently so I am not surprised if inhalers do not seem to do very much.  I asked her to use Trelegy every day for 2 or 3 weeks to be really sure.  If it does not help then she can drop off of it. Plan flu vaccine

## 2018-04-18 ENCOUNTER — Telehealth: Payer: Self-pay | Admitting: Internal Medicine

## 2018-04-18 MED ORDER — FLUTICASONE-UMECLIDIN-VILANT 100-62.5-25 MCG/INH IN AEPB: 1.0000 | INHALATION_SPRAY | Freq: Every day | RESPIRATORY_TRACT | 11 refills | Status: DC

## 2018-04-18 NOTE — Telephone Encounter (Signed)
I sent Rx in to [facility] and tried calling pt but there was no answer. LM for pt to call back.    Patient Instructions by Deneise Lever, MD at 02/15/2018 9:00 AM  Author: Deneise Lever, MD Author Type: Physician Filed: 02/15/2018 9:26 AM  Note Status: Signed Cosign: Cosign Not Required Encounter Date: 02/15/2018  Editor: Deneise Lever, MD (Physician)    Order- flu vax standard  Try using the Trelegy inhaler regularly for 2-3 weeks. If you can't teel any real help for your breathing, then ok to set it aside, and maybe add it if you have a bad stretch this winter, like a chest cold.  Please call if we can help

## 2018-04-18 NOTE — Telephone Encounter (Signed)
Spoke with pt and advised rx sent to pharmacy. Nothing further is needed.   

## 2018-07-05 ENCOUNTER — Ambulatory Visit (INDEPENDENT_AMBULATORY_CARE_PROVIDER_SITE_OTHER): Payer: BLUE CROSS/BLUE SHIELD | Admitting: Nurse Practitioner

## 2018-07-05 ENCOUNTER — Encounter: Payer: Self-pay | Admitting: Nurse Practitioner

## 2018-07-05 VITALS — BP 100/66 | HR 83 | Temp 98.0°F | Ht 61.0 in | Wt 205.2 lb

## 2018-07-05 DIAGNOSIS — G44201 Tension-type headache, unspecified, intractable: Secondary | ICD-10-CM

## 2018-07-05 DIAGNOSIS — R51 Headache: Secondary | ICD-10-CM

## 2018-07-05 DIAGNOSIS — R519 Headache, unspecified: Secondary | ICD-10-CM | POA: Insufficient documentation

## 2018-07-05 MED ORDER — CYCLOBENZAPRINE HCL 5 MG PO TABS: 5.0000 mg | ORAL_TABLET | Freq: Every evening | ORAL | 0 refills | Status: DC | PRN

## 2018-07-05 MED ORDER — ASPIRIN-ACETAMINOPHEN-CAFFEINE 250-250-65 MG PO TABS: 2.0000 | ORAL_TABLET | Freq: Two times a day (BID) | ORAL | 0 refills | Status: DC | PRN

## 2018-07-05 NOTE — Patient Instructions (Signed)
Do not take flexeril if you need to drive within 8hrs.  Tension Headache, Adult A tension headache is pain, pressure, or aching in your head. Tension headaches can last from 30 minutes to several days. Follow these instructions at home: Managing pain  Take over-the-counter and prescription medicines only as told by your doctor.  When you have a headache, lie down in a dark, quiet room.  If told, put ice on your head and neck: ? Put ice in a plastic bag. ? Place a towel between your skin and the bag. ? Leave the ice on for 20 minutes, 2-3 times a day.  If told, put heat on the back of your neck. Do this as often as your doctor tells you to. Use the kind of heat that your doctor recommends, such as a moist heat pack or a heating pad. ? Place a towel between your skin and the heat. ? Leave the heat on for 20-30 minutes. ? Remove the heat if your skin turns bright red. Eating and drinking  Eat meals on a regular schedule.  Watch how much alcohol you drink: ? If you are a woman and are not pregnant, do not drink more than 1 drink a day. ? If you are a man, do not drink more than 2 drinks a day.  Drink enough fluid to keep your pee (urine) pale yellow.  Do not use a lot of caffeine, or stop using caffeine. Lifestyle  Get enough sleep. Get 7-9 hours of sleep each night. Or get the amount of sleep that your doctor tells you to.  At bedtime, remove all electronic devices from your room. Examples of electronic devices are computers, phones, and tablets.  Find ways to lessen your stress. Some things that can lessen stress are: ? Exercise. ? Deep breathing. ? Yoga. ? Music. ? Positive thoughts.  Sit up straight. Do not tighten (tense) your muscles.  Do not use any products that have nicotine or tobacco in them, such as cigarettes and e-cigarettes. If you need help quitting, ask your doctor. General instructions   Keep all follow-up visits as told by your doctor. This is  important.  Avoid things that can bring on headaches. Keep a journal to find out if certain things bring on headaches. For example, write down: ? What you eat and drink. ? How much sleep you get. ? Any change to your diet or medicines. Contact a doctor if:  Your headache does not get better.  Your headache comes back.  You have a headache and sounds, light, or smells bother you.  You feel sick to your stomach (nauseous) or you throw up (vomit).  Your stomach hurts. Get help right away if:  You suddenly get a very bad headache along with any of these: ? A stiff neck. ? Feeling sick to your stomach. ? Throwing up. ? Feeling weak. ? Trouble seeing. ? Feeling short of breath. ? A rash. ? Feeling unusually sleepy. ? Trouble speaking. ? Pain in your eye or ear. ? Trouble walking or balancing. ? Feeling like you will pass out (faint). ? Passing out. Summary  A tension headache is pain, pressure, or aching in your head.  Tension headaches can last from 30 minutes to several days.  Lifestyle changes and medicines may help relieve pain. This information is not intended to replace advice given to you by your health care provider. Make sure you discuss any questions you have with your health care provider. Document Released: 07/19/2009  Document Revised: 08/04/2016 Document Reviewed: 08/04/2016 Elsevier Interactive Patient Education  [employer].

## 2018-07-05 NOTE — Progress Notes (Signed)
Subjective:  Patient ID: Jocelyn Myers, female    DOB: [birth year]  Age: 53 y.o. MRN: [id number]  CC: Establish Care (est care/headache and dizzy--going on 1 mo--took tylenol otc.  )   Headache   This is a recurrent problem. The current episode started 1 to 4 weeks ago. The problem occurs intermittently. The problem has been waxing and waning. The pain is located in the bilateral, parietal and occipital region. The pain does not radiate. The pain quality is similar to prior headaches. The quality of the pain is described as aching and dull. The pain is at a severity of 5/10. The pain is moderate. Associated symptoms include dizziness. Pertinent negatives include no abdominal pain, abnormal behavior, anorexia, blurred vision, coughing, drainage, ear pain, eye pain, eye redness, eye watering, facial sweating, fever, hearing loss, insomnia, loss of balance, muscle aches, nausea, neck pain, numbness, phonophobia, photophobia, rhinorrhea, scalp tenderness, seizures, sinus pressure, sore throat, swollen glands, tingling, tinnitus, visual change, vomiting, weakness or weight loss. Nothing aggravates the symptoms. She has tried acetaminophen for the symptoms. The treatment provided mild relief. Her past medical history is significant for obesity. There is no history of cancer, cluster headaches, hypertension, immunosuppression, migraine headaches, migraines in the family, pseudotumor cerebri, recent head traumas, sinus disease or TMJ.  Headache progressively worsen as the day goes by. Headache does not wake her up Oral hydration: water 40oz per day No soda, occasional tea or coffee intake, no ETOH intake, No tobacco use  Hx of Asthma: Managed by Dr. Annamaria Boots (pulmonology) Controlled with trelegy, albuterol and montelukast.  Reviewed past Medical, Social and Family history today.  Outpatient Medications Prior to Visit  Medication Sig Dispense Refill  . albuterol (PROAIR HFA) 108 (90 BASE) MCG/ACT inhaler  Inhale 2 puffs into the lungs every 6 (six) hours as needed for wheezing or shortness of breath. 3 Inhaler 1  . albuterol (PROVENTIL) (2.5 MG/3ML) 0.083% nebulizer solution Take 3 mLs (2.5 mg total) by nebulization every 4 (four) hours as needed for wheezing or shortness of breath. 75 mL 12  . famotidine (PEPCID) 20 MG tablet Take 1 tablet by mouth as needed.    . fluticasone (FLONASE) 50 MCG/ACT nasal spray Place 2 sprays into both nostrils daily. 16 g 2  . Fluticasone-Umeclidin-Vilant (TRELEGY ELLIPTA) 100-62.5-25 MCG/INH AEPB Inhale 1 puff into the lungs daily. 1 each 11  . guaiFENesin (MUCINEX) 600 MG 12 hr tablet Take 1 tablet (600 mg total) by mouth 2 (two) times daily.    . montelukast (SINGULAIR) 10 MG tablet Take 1 tablet (10 mg total) by mouth at bedtime. 90 tablet 3  . Multiple Vitamin (MULTIVITAMIN) tablet Take 1 tablet by mouth daily.    . phentermine (ADIPEX-P) 37.5 MG tablet Take 1 tablet by mouth as needed.     No facility-administered medications prior to visit.     ROS See HPI  Objective:  BP 100/66   Pulse 83   Temp 98 F (36.7 C) (Oral)   Ht 5\' 1"  (1.549 m)   Wt 205 lb 3.2 oz (93.1 kg)   LMP 02/22/2011   SpO2 100%   BMI 38.77 kg/m   BP Readings from Last 3 Encounters:  07/05/18 100/66  02/15/18 116/66  11/27/17 122/62    Wt Readings from Last 3 Encounters:  07/05/18 205 lb 3.2 oz (93.1 kg)  02/15/18 211 lb 6.4 oz (95.9 kg)  11/27/17 205 lb 6.4 oz (93.2 kg)    Physical Exam Vitals signs reviewed.  Constitutional:      Appearance: Normal appearance.  HENT:     Right Ear: Tympanic membrane, ear canal and external ear normal.     Left Ear: Tympanic membrane, ear canal and external ear normal.     Nose: Nose normal.     Mouth/Throat:     Mouth: Mucous membranes are moist.  Eyes:     Extraocular Movements: Extraocular movements intact.     Conjunctiva/sclera: Conjunctivae normal.     Pupils: Pupils are equal, round, and reactive to light.  Neck:      Musculoskeletal: Normal range of motion and neck supple.  Cardiovascular:     Rate and Rhythm: Normal rate and regular rhythm.     Pulses: Normal pulses.     Heart sounds: Normal heart sounds.  Pulmonary:     Effort: Pulmonary effort is normal.     Breath sounds: Normal breath sounds.  Lymphadenopathy:     Cervical: No cervical adenopathy.  Neurological:     Mental Status: She is alert and oriented to person, place, and time.     Coordination: Coordination normal.     Gait: Gait normal.  Psychiatric:        Mood and Affect: Mood normal.        Behavior: Behavior normal.        Thought Content: Thought content normal.     Lab Results  Component Value Date   WBC 12.5 (H) 06/24/2016   HGB 10.7 (L) 06/24/2016   HCT 34.8 (L) 06/24/2016   PLT 162 06/24/2016   GLUCOSE 123 (H) 06/24/2016   CHOL 199 02/23/2014   TRIG 48.0 02/23/2014   HDL 52.70 02/23/2014   LDLCALC 137 (H) 02/23/2014   ALT 31 06/23/2016   AST 30 06/23/2016   NA 141 06/24/2016   K 4.1 06/24/2016   CL 112 (H) 06/24/2016   CREATININE 0.51 06/24/2016   BUN 8 06/24/2016   CO2 22 06/24/2016    Dg Chest 2 View  Result Date: 08/16/2017 CLINICAL DATA:  History of asthma EXAM: CHEST - 2 VIEW COMPARISON:  06/24/2016 FINDINGS: The heart size and mediastinal contours are within normal limits. Both lungs are clear. The visualized skeletal structures are unremarkable. IMPRESSION: No active cardiopulmonary disease. Electronically Signed   By: Inez Catalina M.D.   On: 08/16/2017 13:35    Assessment & Plan:   Yana was seen today for establish care.  Diagnoses and all orders for this visit:  Acute intractable tension-type headache -     aspirin-acetaminophen-caffeine (EXCEDRIN MIGRAINE) 250-250-65 MG tablet; Take 2 tablets by mouth every 12 (twelve) hours as needed for headache. With food -     cyclobenzaprine (FLEXERIL) 5 MG tablet; Take 1 tablet (5 mg total) by mouth at bedtime as needed for muscle spasms.   I have  discontinued Lattie Haw A. Pressman's phentermine. I am also having her start on aspirin-acetaminophen-caffeine and cyclobenzaprine. Additionally, I am having her maintain her albuterol, albuterol, guaiFENesin, fluticasone, multivitamin, montelukast, famotidine, and Fluticasone-Umeclidin-Vilant.  Meds ordered this encounter  Medications  . aspirin-acetaminophen-caffeine (EXCEDRIN MIGRAINE) 250-250-65 MG tablet    Sig: Take 2 tablets by mouth every 12 (twelve) hours as needed for headache. With food    Dispense:  30 tablet    Refill:  0    Order Specific Question:   Supervising Provider    Answer:   MATTHEWS, CODY [[id number]]  . cyclobenzaprine (FLEXERIL) 5 MG tablet    Sig: Take 1 tablet (5 mg total) by mouth  at bedtime as needed for muscle spasms.    Dispense:  10 tablet    Refill:  0    Order Specific Question:   Supervising Provider    Answer:   MATTHEWS, CODY [[id number]]    Problem List Items Addressed This Visit      Other   Acute intractable tension-type headache - Primary   Relevant Medications   aspirin-acetaminophen-caffeine (EXCEDRIN MIGRAINE) 250-250-65 MG tablet   cyclobenzaprine (FLEXERIL) 5 MG tablet       Follow-up: Return in about 12 days (around 07/17/2018) for CPE (fasting) and Headache re eval.  Wilfred Lacy, NP

## 2018-07-05 NOTE — Assessment & Plan Note (Signed)
Ovaries still present. Secondary to menorrhagia and uterine fibroids No hx of abnormal PAP. No FHx of cervical or uterine or ovarian cancer.

## 2018-07-09 DIAGNOSIS — L2089 Other atopic dermatitis: Secondary | ICD-10-CM | POA: Diagnosis not present

## 2018-07-09 DIAGNOSIS — L810 Postinflammatory hyperpigmentation: Secondary | ICD-10-CM | POA: Diagnosis not present

## 2018-07-17 ENCOUNTER — Other Ambulatory Visit: Payer: Self-pay

## 2018-07-17 ENCOUNTER — Ambulatory Visit (INDEPENDENT_AMBULATORY_CARE_PROVIDER_SITE_OTHER): Payer: BLUE CROSS/BLUE SHIELD | Admitting: Nurse Practitioner

## 2018-07-17 ENCOUNTER — Encounter: Payer: Self-pay | Admitting: Nurse Practitioner

## 2018-07-17 VITALS — BP 104/68 | HR 74 | Temp 98.6°F | Ht 61.0 in | Wt 209.6 lb

## 2018-07-17 DIAGNOSIS — Z1231 Encounter for screening mammogram for malignant neoplasm of breast: Secondary | ICD-10-CM | POA: Diagnosis not present

## 2018-07-17 DIAGNOSIS — G44209 Tension-type headache, unspecified, not intractable: Secondary | ICD-10-CM

## 2018-07-17 DIAGNOSIS — Z136 Encounter for screening for cardiovascular disorders: Secondary | ICD-10-CM | POA: Diagnosis not present

## 2018-07-17 DIAGNOSIS — Z0001 Encounter for general adult medical examination with abnormal findings: Secondary | ICD-10-CM

## 2018-07-17 DIAGNOSIS — Z13220 Encounter for screening for lipoid disorders: Secondary | ICD-10-CM | POA: Diagnosis not present

## 2018-07-17 DIAGNOSIS — Z1211 Encounter for screening for malignant neoplasm of colon: Secondary | ICD-10-CM | POA: Diagnosis not present

## 2018-07-17 LAB — LIPID PANEL
Cholesterol: 172 mg/dL (ref 0–200)
HDL: 62.9 mg/dL (ref >39.00)
LDL Cholesterol: 98 mg/dL (ref 0–99)
NonHDL: 109.15
Total CHOL/HDL Ratio: 3
Triglycerides: 56 mg/dL (ref 0.0–149.0)
VLDL: 11.2 mg/dL (ref 0.0–40.0)

## 2018-07-17 LAB — COMPREHENSIVE METABOLIC PANEL
ALT: 21 U/L (ref 0–35)
AST: 18 U/L (ref 0–37)
Albumin: 4.1 g/dL (ref 3.5–5.2)
Alkaline Phosphatase: 65 U/L (ref 39–117)
BILIRUBIN TOTAL: 0.4 mg/dL (ref 0.2–1.2)
BUN: 15 mg/dL (ref 6–23)
CO2: 24 mEq/L (ref 19–32)
CREATININE: 0.5 mg/dL (ref 0.40–1.20)
Calcium: 8.9 mg/dL (ref 8.4–10.5)
Chloride: 107 mEq/L (ref 96–112)
GFR: 157.28 mL/min (ref >60.00)
GLUCOSE: 81 mg/dL (ref 70–99)
Potassium: 4.2 mEq/L (ref 3.5–5.1)
SODIUM: 138 meq/L (ref 135–145)
TOTAL PROTEIN: 6.8 g/dL (ref 6.0–8.3)

## 2018-07-17 LAB — CBC
HCT: 37.5 % (ref 36.0–46.0)
Hemoglobin: 12 g/dL (ref 12.0–15.0)
MCHC: 31.9 g/dL (ref 30.0–36.0)
MCV: 83.7 fl (ref 78.0–100.0)
Platelets: 151 10*3/uL (ref 150.0–400.0)
RBC: 4.48 Mil/uL (ref 3.87–5.11)
RDW: 13.5 % (ref 11.5–15.5)
WBC: 5.1 10*3/uL (ref 4.0–10.5)

## 2018-07-17 LAB — TSH: TSH: 1.85 u[IU]/mL (ref 0.35–4.50)

## 2018-07-17 MED ORDER — BUTALBITAL-APAP-CAFFEINE 50-325-40 MG PO TABS: 1.0000 | ORAL_TABLET | Freq: Three times a day (TID) | ORAL | 0 refills | Status: DC | PRN

## 2018-07-17 NOTE — Patient Instructions (Signed)
Go to lab for blood draw.  You will be contacted to schedule appt with GI and for mammogram.  Health Maintenance, Female Adopting a healthy lifestyle and getting preventive care can go a long way to promote health and wellness. Talk with your health care provider about what schedule of regular examinations is right for you. This is a good chance for you to check in with your provider about disease prevention and staying healthy. In between checkups, there are plenty of things you can do on your own. Experts have done a lot of research about which lifestyle changes and preventive measures are most likely to keep you healthy. Ask your health care provider for more information. Weight and diet Eat a healthy diet  Be sure to include plenty of vegetables, fruits, low-fat dairy products, and lean protein.  Do not eat a lot of foods high in solid fats, added sugars, or salt.  Get regular exercise. This is one of the most important things you can do for your health. ? Most adults should exercise for at least 150 minutes each week. The exercise should increase your heart rate and make you sweat (moderate-intensity exercise). ? Most adults should also do strengthening exercises at least twice a week. This is in addition to the moderate-intensity exercise. Maintain a healthy weight  Body mass index (BMI) is a measurement that can be used to identify possible weight problems. It estimates body fat based on height and weight. Your health care provider can help determine your BMI and help you achieve or maintain a healthy weight.  For females 71 years of age and older: ? A BMI below 18.5 is considered underweight. ? A BMI of 18.5 to 24.9 is normal. ? A BMI of 25 to 29.9 is considered overweight. ? A BMI of 30 and above is considered obese. Watch levels of cholesterol and blood lipids  You should start having your blood tested for lipids and cholesterol at 51 years of age, then have this test every 5  years.  You may need to have your cholesterol levels checked more often if: ? Your lipid or cholesterol levels are high. ? You are older than 51 years of age. ? You are at high risk for heart disease. Cancer screening Lung Cancer  Lung cancer screening is recommended for adults 91-58 years old who are at high risk for lung cancer because of a history of smoking.  A yearly low-dose CT scan of the lungs is recommended for people who: ? Currently smoke. ? Have quit within the past 15 years. ? Have at least a 30-pack-year history of smoking. A pack year is smoking an average of one pack of cigarettes a day for 1 year.  Yearly screening should continue until it has been 15 years since you quit.  Yearly screening should stop if you develop a health problem that would prevent you from having lung cancer treatment. Breast Cancer  Practice breast self-awareness. This means understanding how your breasts normally appear and feel.  It also means doing regular breast self-exams. Let your health care provider know about any changes, no matter how small.  If you are in your 20s or 30s, you should have a clinical breast exam (CBE) by a health care provider every 1-3 years as part of a regular health exam.  If you are 51 or older, have a CBE every year. Also consider having a breast X-ray (mammogram) every year.  If you have a family history of breast cancer,  talk to your health care provider about genetic screening.  If you are at high risk for breast cancer, talk to your health care provider about having an MRI and a mammogram every year.  Breast cancer gene (BRCA) assessment is recommended for women who have family members with BRCA-related cancers. BRCA-related cancers include: ? Breast. ? Ovarian. ? Tubal. ? Peritoneal cancers.  Results of the assessment will determine the need for genetic counseling and BRCA1 and BRCA2 testing. Cervical Cancer Your health care provider may recommend  that you be screened regularly for cancer of the pelvic organs (ovaries, uterus, and vagina). This screening involves a pelvic examination, including checking for microscopic changes to the surface of your cervix (Pap test). You may be encouraged to have this screening done every 3 years, beginning at age 30.  For women ages 25-65, health care providers may recommend pelvic exams and Pap testing every 3 years, or they may recommend the Pap and pelvic exam, combined with testing for human papilloma virus (HPV), every 5 years. Some types of HPV increase your risk of cervical cancer. Testing for HPV may also be done on women of any age with unclear Pap test results.  Other health care providers may not recommend any screening for nonpregnant women who are considered low risk for pelvic cancer and who do not have symptoms. Ask your health care provider if a screening pelvic exam is right for you.  If you have had past treatment for cervical cancer or a condition that could lead to cancer, you need Pap tests and screening for cancer for at least 20 years after your treatment. If Pap tests have been discontinued, your risk factors (such as having a new sexual partner) need to be reassessed to determine if screening should resume. Some women have medical problems that increase the chance of getting cervical cancer. In these cases, your health care provider may recommend more frequent screening and Pap tests. Colorectal Cancer  This type of cancer can be detected and often prevented.  Routine colorectal cancer screening usually begins at 51 years of age and continues through 51 years of age.  Your health care provider may recommend screening at an earlier age if you have risk factors for colon cancer.  Your health care provider may also recommend using home test kits to check for hidden blood in the stool.  A small camera at the end of a tube can be used to examine your colon directly (sigmoidoscopy or  colonoscopy). This is done to check for the earliest forms of colorectal cancer.  Routine screening usually begins at age 31.  Direct examination of the colon should be repeated every 5-10 years through 51 years of age. However, you may need to be screened more often if early forms of precancerous polyps or small growths are found. Skin Cancer  Check your skin from head to toe regularly.  Tell your health care provider about any new moles or changes in moles, especially if there is a change in a mole's shape or color.  Also tell your health care provider if you have a mole that is larger than the size of a pencil eraser.  Always use sunscreen. Apply sunscreen liberally and repeatedly throughout the day.  Protect yourself by wearing long sleeves, pants, a wide-brimmed hat, and sunglasses whenever you are outside. Heart disease, diabetes, and high blood pressure  High blood pressure causes heart disease and increases the risk of stroke. High blood pressure is more likely to develop  in: ? People who have blood pressure in the high end of the normal range (130-139/85-89 mm Hg). ? People who are overweight or obese. ? People who are African American.  If you are 76-52 years of age, have your blood pressure checked every 3-5 years. If you are 80 years of age or older, have your blood pressure checked every year. You should have your blood pressure measured twice-once when you are at a hospital or clinic, and once when you are not at a hospital or clinic. Record the average of the two measurements. To check your blood pressure when you are not at a hospital or clinic, you can use: ? An automated blood pressure machine at a pharmacy. ? A home blood pressure monitor.  If you are between 62 years and 45 years old, ask your health care provider if you should take aspirin to prevent strokes.  Have regular diabetes screenings. This involves taking a blood sample to check your fasting blood sugar  level. ? If you are at a normal weight and have a low risk for diabetes, have this test once every three years after 51 years of age. ? If you are overweight and have a high risk for diabetes, consider being tested at a younger age or more often. Preventing infection Hepatitis B  If you have a higher risk for hepatitis B, you should be screened for this virus. You are considered at high risk for hepatitis B if: ? You were born in a country where hepatitis B is common. Ask your health care provider which countries are considered high risk. ? Your parents were born in a high-risk country, and you have not been immunized against hepatitis B (hepatitis B vaccine). ? You have HIV or AIDS. ? You use needles to inject street drugs. ? You live with someone who has hepatitis B. ? You have had sex with someone who has hepatitis B. ? You get hemodialysis treatment. ? You take certain medicines for conditions, including cancer, organ transplantation, and autoimmune conditions. Hepatitis C  Blood testing is recommended for: ? Everyone born from [birth year] through 1963. ? Anyone with known risk factors for hepatitis C. Sexually transmitted infections (STIs)  You should be screened for sexually transmitted infections (STIs) including gonorrhea and chlamydia if: ? You are sexually active and are younger than 51 years of age. ? You are older than 51 years of age and your health care provider tells you that you are at risk for this type of infection. ? Your sexual activity has changed since you were last screened and you are at an increased risk for chlamydia or gonorrhea. Ask your health care provider if you are at risk.  If you do not have HIV, but are at risk, it may be recommended that you take a prescription medicine daily to prevent HIV infection. This is called pre-exposure prophylaxis (PrEP). You are considered at risk if: ? You are sexually active and do not regularly use condoms or know the HIV status  of your partner(s). ? You take drugs by injection. ? You are sexually active with a partner who has HIV. Talk with your health care provider about whether you are at high risk of being infected with HIV. If you choose to begin PrEP, you should first be tested for HIV. You should then be tested every 3 months for as long as you are taking PrEP. Pregnancy  If you are premenopausal and you may become pregnant, ask your health care  provider about preconception counseling.  If you may become pregnant, take 400 to 800 micrograms (mcg) of folic acid every day.  If you want to prevent pregnancy, talk to your health care provider about birth control (contraception). Osteoporosis and menopause  Osteoporosis is a disease in which the bones lose minerals and strength with aging. This can result in serious bone fractures. Your risk for osteoporosis can be identified using a bone density scan.  If you are 89 years of age or older, or if you are at risk for osteoporosis and fractures, ask your health care provider if you should be screened.  Ask your health care provider whether you should take a calcium or vitamin D supplement to lower your risk for osteoporosis.  Menopause may have certain physical symptoms and risks.  Hormone replacement therapy may reduce some of these symptoms and risks. Talk to your health care provider about whether hormone replacement therapy is right for you. Follow these instructions at home:  Schedule regular health, dental, and eye exams.  Stay current with your immunizations.  Do not use any tobacco products including cigarettes, chewing tobacco, or electronic cigarettes.  If you are pregnant, do not drink alcohol.  If you are breastfeeding, limit how much and how often you drink alcohol.  Limit alcohol intake to no more than 1 drink per day for nonpregnant women. One drink equals 12 ounces of beer, 5 ounces of wine, or 1 ounces of hard liquor.  Do not use street  drugs.  Do not share needles.  Ask your health care provider for help if you need support or information about quitting drugs.  Tell your health care provider if you often feel depressed.  Tell your health care provider if you have ever been abused or do not feel safe at home. This information is not intended to replace advice given to you by your health care provider. Make sure you discuss any questions you have with your health care provider. Document Released: 11/07/2010 Document Revised: 09/30/2015 Document Reviewed: 01/26/2015 Elsevier Interactive Patient Education  2019 [employer].

## 2018-07-17 NOTE — Progress Notes (Signed)
Subjective:    Patient ID: Jocelyn Myers, female    DOB: August 05, 1965, 53 y.o.   MRN: [id number]  Patient presents today for complete physical and re eval of headache  HPI  persistent headache: ongoing for 3month, waxing and waning, describes as dull ache, located in temporal and occipital location, improved dizziness but persistent headache, minimal improvement with flexeril and excedrin. Not worse headache in her life, but bother by the fact that it is persistent and occurs daily. Sometimes wakes up with headache.  Sexual History (orientation,birth control, marital status, STD):sexually active, s/p hysterectomy, married, upcoming appt with GYN  Depression/Suicide: Depression screen [facility] 2/9 07/05/2018  Decreased Interest 0  Down, Depressed, Hopeless 0  PHQ - 2 Score 0   Vision:up to date  Dental:up to date  Immunizations: (TDAP, Hep C screen, Pneumovax, Influenza, zoster)  Health Maintenance  Topic Date Due  . Mammogram  05/12/2017  . Colon Cancer Screening  05/12/2017  . Tetanus Vaccine  02/24/2024  . Flu Shot  Completed  . HIV Screening  Completed   Diet:regular.  Weight:  Wt Readings from Last 3 Encounters:  07/17/18 209 lb 9.6 oz (95.1 kg)  07/05/18 205 lb 3.2 oz (93.1 kg)  02/15/18 211 lb 6.4 oz (95.9 kg)   Exercise:none  Fall Risk: Fall Risk  07/05/2018  Falls in the past year? 0   Advanced Directive: Advanced Directives 06/23/2016  Does Patient Have a Medical Advance Directive? No  Would patient like information on creating a medical advance directive? No - Patient declined  Pre-existing out of facility DNR order (yellow form or pink MOST form) -     Medications and allergies reviewed with patient and updated if appropriate.  Patient Active Problem List   Diagnosis Date Noted  . Headache 07/05/2018  . Seasonal and perennial allergic rhinitis 07/04/2016  . Obesity (BMI 35.0-39.9 without comorbidity) 02/23/2014  . Asthma-COPD overlap syndrome ([place])  06/06/2012  . S/P hysterectomy- [facility] (10/29) 03/06/2011    Current Outpatient Medications on File Prior to Visit  Medication Sig Dispense Refill  . albuterol (PROAIR HFA) 108 (90 BASE) MCG/ACT inhaler Inhale 2 puffs into the lungs every 6 (six) hours as needed for wheezing or shortness of breath. 3 Inhaler 1  . albuterol (PROVENTIL) (2.5 MG/3ML) 0.083% nebulizer solution Take 3 mLs (2.5 mg total) by nebulization every 4 (four) hours as needed for wheezing or shortness of breath. 75 mL 12  . famotidine (PEPCID) 20 MG tablet Take 1 tablet by mouth as needed.    . fluticasone (FLONASE) 50 MCG/ACT nasal spray Place 2 sprays into both nostrils daily. 16 g 2  . Fluticasone-Umeclidin-Vilant (TRELEGY ELLIPTA) 100-62.5-25 MCG/INH AEPB Inhale 1 puff into the lungs daily. 1 each 11  . guaiFENesin (MUCINEX) 600 MG 12 hr tablet Take 1 tablet (600 mg total) by mouth 2 (two) times daily.    . montelukast (SINGULAIR) 10 MG tablet Take 1 tablet (10 mg total) by mouth at bedtime. 90 tablet 3  . Multiple Vitamin (MULTIVITAMIN) tablet Take 1 tablet by mouth daily.     No current facility-administered medications on file prior to visit.     Past Medical History:  Diagnosis Date  . Asthma   . COPD (chronic obstructive pulmonary disease) ([place])     Past Surgical History:  Procedure Laterality Date  . ABDOMINAL HYSTERECTOMY     88yrs ago, ovaries present, benign pathology per patient  . DILATION AND CURETTAGE OF UTERUS  2009   polyp removed  .  TUBAL LIGATION      Social History   Socioeconomic History  . Marital status: Married    Spouse name: Mahaila Tischer  . Number of children: 3  . Years of education: Not on file  . Highest education level: Not on file  Occupational History  . Occupation: Buyer, retail: [employer]  Social Needs  . Financial resource strain: Not on file  . Food insecurity:    Worry: Not on file    Inability: Not on file  . Transportation needs:    Medical: Not on file     Non-medical: Not on file  Tobacco Use  . Smoking status: Never Smoker  . Smokeless tobacco: Never Used  Substance and Sexual Activity  . Alcohol use: No  . Drug use: No  . Sexual activity: Not on file  Lifestyle  . Physical activity:    Days per week: Not on file    Minutes per session: Not on file  . Stress: Not on file  Relationships  . Social connections:    Talks on phone: Not on file    Gets together: Not on file    Attends religious service: Not on file    Active member of club or organization: Not on file    Attends meetings of clubs or organizations: Not on file    Relationship status: Not on file  Other Topics Concern  . Not on file  Social History Narrative  . Not on file    Family History  Problem Relation Age of Onset  . Asthma Son        Review of Systems  Constitutional: Negative.   HENT: Negative.   Eyes: Negative.   Respiratory: Negative.   Cardiovascular: Negative.   Gastrointestinal: Negative.   Genitourinary: Negative.   Musculoskeletal: Negative.   Skin: Negative.   Neurological: Positive for headaches. Negative for dizziness, sensory change, speech change, focal weakness, seizures and weakness.  Endo/Heme/Allergies: Negative.   Psychiatric/Behavioral: Negative.    Objective:   Vitals:   07/17/18 0845  BP: 104/68  Pulse: 74  Temp: 98.6 F (37 C)  SpO2: 100%   Body mass index is 39.6 kg/m.  Physical Examination:  Physical Exam Vitals signs reviewed.  Constitutional:      General: She is not in acute distress.    Appearance: She is well-developed.  HENT:     Right Ear: Tympanic membrane, ear canal and external ear normal.     Left Ear: Tympanic membrane, ear canal and external ear normal.     Nose: Nose normal.     Mouth/Throat:     Mouth: Mucous membranes are moist.     Pharynx: No oropharyngeal exudate.  Eyes:     Extraocular Movements: Extraocular movements intact.     Conjunctiva/sclera: Conjunctivae normal.      Pupils: Pupils are equal, round, and reactive to light.  Neck:     Musculoskeletal: Normal range of motion and neck supple.     Thyroid: No thyroid mass, thyromegaly or thyroid tenderness.  Cardiovascular:     Rate and Rhythm: Normal rate and regular rhythm.     Pulses: Normal pulses.     Heart sounds: Normal heart sounds.  Pulmonary:     Effort: Pulmonary effort is normal. No respiratory distress.     Breath sounds: Normal breath sounds.  Chest:     Chest wall: No tenderness.  Abdominal:     General: Bowel sounds are normal. There is no distension.  Palpations: Abdomen is soft.     Tenderness: There is no abdominal tenderness.  Genitourinary:    Comments: Deferred pelvic and breast exam to GYN per patient Musculoskeletal: Normal range of motion.  Lymphadenopathy:     Cervical: No cervical adenopathy.  Neurological:     Mental Status: She is alert and oriented to person, place, and time.     Cranial Nerves: No cranial nerve deficit.     Deep Tendon Reflexes: Reflexes are normal and symmetric.  Psychiatric:        Mood and Affect: Mood normal.        Behavior: Behavior normal.        Thought Content: Thought content normal.    ASSESSMENT and PLAN:  Anastasha was seen today for annual exam.  Diagnoses and all orders for this visit:  Encounter for preventative adult health care exam with abnormal findings -     CBC -     Comprehensive metabolic panel -     TSH -     Lipid panel  Colon cancer screening -     Ambulatory referral to Gastroenterology  Encounter for lipid screening for cardiovascular disease -     Lipid panel  Acute non intractable tension-type headache -     butalbital-acetaminophen-caffeine (FIORICET, ESGIC) 50-325-40 MG tablet; Take 1-2 tablets by mouth every 8 (eight) hours as needed for headache.  Breast cancer screening by mammogram -     MM DIGITAL SCREENING BILATERAL; Future   Headache persistent headache: ongoing for 58month, waxing and waning,  describes as dull ache, located in temporal and occipital location, no aura, no new symptoms since last OV,  improved dizziness but persistent headache, minimal improvement with flexeril and excedrin. Not worse headache in her life, but bother by the fact that it is persistent and occurs daily. Sometimes wakes up with headache.  Fioricet Rx given today. Consider head CT if no improvement.     Problem List Items Addressed This Visit      Other   Headache    persistent headache: ongoing for 30month, waxing and waning, describes as dull ache, located in temporal and occipital location, no aura, no new symptoms since last OV,  improved dizziness but persistent headache, minimal improvement with flexeril and excedrin. Not worse headache in her life, but bother by the fact that it is persistent and occurs daily. Sometimes wakes up with headache.  Fioricet Rx given today. Consider head CT if no improvement.      Relevant Medications   butalbital-acetaminophen-caffeine (FIORICET, ESGIC) 50-325-40 MG tablet    Other Visit Diagnoses    Encounter for preventative adult health care exam with abnormal findings    -  Primary   Relevant Orders   CBC (Completed)   Comprehensive metabolic panel (Completed)   TSH (Completed)   Lipid panel (Completed)   Colon cancer screening       Relevant Orders   Ambulatory referral to Gastroenterology   Encounter for lipid screening for cardiovascular disease       Relevant Orders   Lipid panel (Completed)   Breast cancer screening by mammogram       Relevant Orders   MM DIGITAL SCREENING BILATERAL      Follow up: Return if symptoms worsen or fail to improve.  Wilfred Lacy, NP

## 2018-07-17 NOTE — Assessment & Plan Note (Addendum)
persistent headache: ongoing for 81month, waxing and waning, describes as dull ache, located in temporal and occipital location, no aura, no new symptoms since last OV,  improved dizziness but persistent headache, minimal improvement with flexeril and excedrin. Not worse headache in her life, but bother by the fact that it is persistent and occurs daily. Sometimes wakes up with headache.  Fioricet Rx given today. Consider head CT if no improvement.

## 2018-07-26 ENCOUNTER — Other Ambulatory Visit: Payer: Self-pay

## 2018-07-26 ENCOUNTER — Ambulatory Visit (INDEPENDENT_AMBULATORY_CARE_PROVIDER_SITE_OTHER): Payer: BLUE CROSS/BLUE SHIELD | Admitting: Nurse Practitioner

## 2018-07-26 ENCOUNTER — Encounter: Payer: Self-pay | Admitting: Nurse Practitioner

## 2018-07-26 DIAGNOSIS — G4452 New daily persistent headache (NDPH): Secondary | ICD-10-CM

## 2018-07-26 MED ORDER — SUMATRIPTAN-NAPROXEN SODIUM 85-500 MG PO TABS: 1.0000 | ORAL_TABLET | Freq: Two times a day (BID) | ORAL | 0 refills | Status: DC | PRN

## 2018-07-26 NOTE — Patient Instructions (Signed)
Call office if no improvement in 3days.  You will be contacted to schedule appt for head CT.   General Headache Without Cause A headache is pain or discomfort that is felt around the head or neck area. There are many causes and types of headaches. In some cases, the cause may not be found. Follow these instructions at home: Watch your condition for any changes. Let your doctor know about them. Take these steps to help with your condition: Managing pain      Take over-the-counter and prescription medicines only as told by your doctor.  Lie down in a dark, quiet room when you have a headache.  If told, put ice on your head and neck area: ? Put ice in a plastic bag. ? Place a towel between your skin and the bag. ? Leave the ice on for 20 minutes, 2-3 times per day.  If told, put heat on the affected area. Use the heat source that your doctor recommends, such as a moist heat pack or a heating pad. ? Place a towel between your skin and the heat source. ? Leave the heat on for 20-30 minutes. ? Remove the heat if your skin turns bright red. This is very important if you are unable to feel pain, heat, or cold. You may have a greater risk of getting burned.  Keep lights dim if bright lights bother you or make your headaches worse. Eating and drinking  Eat meals on a regular schedule.  If you drink alcohol: ? Limit how much you use to:  0-1 drink a day for women.  0-2 drinks a day for men. ? Be aware of how much alcohol is in your drink. In the [place] one drink equals one 12 oz bottle of beer (355 mL), one 5 oz glass of wine (148 mL), or one 1 oz glass of hard liquor (44 mL).  Stop drinking caffeine, or reduce how much caffeine you drink. General instructions   Keep a journal to find out if certain things bring on headaches. For example, write down: ? What you eat and drink. ? How much sleep you get. ? Any change to your diet or medicines.  Get a massage or try other ways to  relax.  Limit stress.  Sit up straight. Do not tighten (tense) your muscles.  Do not use any products that contain nicotine or tobacco. This includes cigarettes, e-cigarettes, and chewing tobacco. If you need help quitting, ask your doctor.  Exercise regularly as told by your doctor.  Get enough sleep. This often means 7-9 hours of sleep each night.  Keep all follow-up visits as told by your doctor. This is important. Contact a doctor if:  Your symptoms are not helped by medicine.  You have a headache that feels different than the other headaches.  You feel sick to your stomach (nauseous) or you throw up (vomit).  You have a fever. Get help right away if:  Your headache gets very bad quickly.  Your headache gets worse after a lot of physical activity.  You keep throwing up.  You have a stiff neck.  You have trouble seeing.  You have trouble speaking.  You have pain in the eye or ear.  Your muscles are weak or you lose muscle control.  You lose your balance or have trouble walking.  You feel like you will pass out (faint) or you pass out.  You are mixed up (confused).  You have a seizure. Summary  A  headache is pain or discomfort that is felt around the head or neck area.  There are many causes and types of headaches. In some cases, the cause may not be found.  Keep a journal to help find out what causes your headaches. Watch your condition for any changes. Let your doctor know about them.  Contact a doctor if you have a headache that is different from usual, or if your headache is not helped by medicine.  Get help right away if your headache gets very bad, you throw up, you have trouble seeing, you lose your balance, or you have a seizure. This information is not intended to replace advice given to you by your health care provider. Make sure you discuss any questions you have with your health care provider. Document Released: 02/01/2008 Document Revised:  11/12/2017 Document Reviewed: 11/12/2017 Elsevier Interactive Patient Education  2019 [employer].

## 2018-07-26 NOTE — Progress Notes (Signed)
Subjective:  Patient ID: Herold Harms, female    DOB: [birth year]  Age: 53 y.o. MRN: [id number]  CC: Headache (headache-dizzy-going on and off for 3 mo/fioricet didnt help. )  HPI Headache:  Ms. Buccieri reports persistent daily headache despite use of NSAIDs, flexeril, fioricet, and tylenol. Associated with intermittent dizziness. Wakes up with headache daily, dizziness worse with rapid head movement and change in postiton. No tinnitus, no sinus congestion, no hearing loss, no fever, no neck pain/stiffness, no change in vision, no fever Denies any new symptoms.  Reviewed past Medical, Social and Family history today.  Outpatient Medications Prior to Visit  Medication Sig Dispense Refill  . albuterol (PROAIR HFA) 108 (90 BASE) MCG/ACT inhaler Inhale 2 puffs into the lungs every 6 (six) hours as needed for wheezing or shortness of breath. 3 Inhaler 1  . albuterol (PROVENTIL) (2.5 MG/3ML) 0.083% nebulizer solution Take 3 mLs (2.5 mg total) by nebulization every 4 (four) hours as needed for wheezing or shortness of breath. 75 mL 12  . butalbital-acetaminophen-caffeine (FIORICET, ESGIC) 50-325-40 MG tablet Take 1-2 tablets by mouth every 8 (eight) hours as needed for headache. 20 tablet 0  . famotidine (PEPCID) 20 MG tablet Take 1 tablet by mouth as needed.    . fluticasone (FLONASE) 50 MCG/ACT nasal spray Place 2 sprays into both nostrils daily. 16 g 2  . Fluticasone-Umeclidin-Vilant (TRELEGY ELLIPTA) 100-62.5-25 MCG/INH AEPB Inhale 1 puff into the lungs daily. 1 each 11  . guaiFENesin (MUCINEX) 600 MG 12 hr tablet Take 1 tablet (600 mg total) by mouth 2 (two) times daily.    . montelukast (SINGULAIR) 10 MG tablet Take 1 tablet (10 mg total) by mouth at bedtime. 90 tablet 3  . Multiple Vitamin (MULTIVITAMIN) tablet Take 1 tablet by mouth daily.     No facility-administered medications prior to visit.     ROS See HPI  Objective:  BP 98/68   Pulse 72   Temp 98.5 F (36.9 C) (Oral)    Ht 5\' 1"  (1.549 m)   Wt 207 lb 9.6 oz (94.2 kg)   LMP 02/22/2011   SpO2 99%   BMI 39.23 kg/m   BP Readings from Last 3 Encounters:  07/26/18 98/68  07/17/18 104/68  07/05/18 100/66    Wt Readings from Last 3 Encounters:  07/26/18 207 lb 9.6 oz (94.2 kg)  07/17/18 209 lb 9.6 oz (95.1 kg)  07/05/18 205 lb 3.2 oz (93.1 kg)   Orthostatic BP taken today: negative.  Physical Exam Eyes:     Extraocular Movements: Extraocular movements intact.     Pupils: Pupils are equal, round, and reactive to light.  Neck:     Musculoskeletal: Normal range of motion and neck supple.  Cardiovascular:     Rate and Rhythm: Normal rate.  Pulmonary:     Effort: Pulmonary effort is normal.  Lymphadenopathy:     Cervical: No cervical adenopathy.  Neurological:     Mental Status: She is alert.  Psychiatric:        Mood and Affect: Mood normal.        Speech: Speech normal.        Behavior: Behavior normal.    Lab Results  Component Value Date   WBC 5.1 07/17/2018   HGB 12.0 07/17/2018   HCT 37.5 07/17/2018   PLT 151.0 07/17/2018   GLUCOSE 81 07/17/2018   CHOL 172 07/17/2018   TRIG 56.0 07/17/2018   HDL 62.90 07/17/2018   LDLCALC 98 07/17/2018  ALT 21 07/17/2018   AST 18 07/17/2018   NA 138 07/17/2018   K 4.2 07/17/2018   CL 107 07/17/2018   CREATININE 0.50 07/17/2018   BUN 15 07/17/2018   CO2 24 07/17/2018   TSH 1.85 07/17/2018    Dg Chest 2 View  Result Date: 08/16/2017 CLINICAL DATA:  History of asthma EXAM: CHEST - 2 VIEW COMPARISON:  06/24/2016 FINDINGS: The heart size and mediastinal contours are within normal limits. Both lungs are clear. The visualized skeletal structures are unremarkable. IMPRESSION: No active cardiopulmonary disease. Electronically Signed   By: Inez Catalina M.D.   On: 08/16/2017 13:35    Assessment & Plan:   Mehak was seen today for headache.  Diagnoses and all orders for this visit:  New daily persistent headache -     CT Head Wo Contrast; Future  -     SUMAtriptan-naproxen (TREXIMET) 85-500 MG tablet; Take 1 tablet by mouth every 12 (twelve) hours as needed for migraine.   I am having Yuki A. Letendre start on SUMAtriptan-naproxen. I am also having her maintain her albuterol, albuterol, guaiFENesin, fluticasone, multivitamin, montelukast, famotidine, Fluticasone-Umeclidin-Vilant, and butalbital-acetaminophen-caffeine.  Meds ordered this encounter  Medications  . SUMAtriptan-naproxen (TREXIMET) 85-500 MG tablet    Sig: Take 1 tablet by mouth every 12 (twelve) hours as needed for migraine.    Dispense:  10 tablet    Refill:  0    Order Specific Question:   Supervising Provider    Answer:   MATTHEWS, CODY [[id number]]    Problem List Items Addressed This Visit      Other   Headache   Relevant Medications   SUMAtriptan-naproxen (TREXIMET) 85-500 MG tablet   Other Relevant Orders   CT Head Wo Contrast       Follow-up: Return if symptoms worsen or fail to improve.  Wilfred Lacy, NP

## 2018-07-29 ENCOUNTER — Encounter: Payer: Self-pay | Admitting: Nurse Practitioner

## 2018-07-29 NOTE — Assessment & Plan Note (Signed)
Onset  persistent daily headache despite use of NSAIDs, flexeril, fioricet, and tylenol. Associated with intermittent dizziness. Wakes up with headache daily, dizziness worse with rapid head movement and change in postiton. No tinnitus, no sinus congestion, no hearing loss, no fever, no neck pain/stiffness, no change in vision, no fever Denies any new symptoms.

## 2018-08-01 ENCOUNTER — Telehealth: Payer: Self-pay | Admitting: Nurse Practitioner

## 2018-08-01 DIAGNOSIS — G4452 New daily persistent headache (NDPH): Secondary | ICD-10-CM

## 2018-08-01 NOTE — Telephone Encounter (Signed)
Pt called and l/m and said that she was not able to afford the rx Trexinet it cost $300.00 with insurance and wanted to know if she can be given something cheaper.

## 2018-08-02 MED ORDER — NAPROXEN 500 MG PO TABS: 500.0000 mg | ORAL_TABLET | Freq: Two times a day (BID) | ORAL | 0 refills | Status: DC

## 2018-08-02 MED ORDER — SUMATRIPTAN SUCCINATE 50 MG PO TABS: 50.0000 mg | ORAL_TABLET | Freq: Two times a day (BID) | ORAL | 0 refills | Status: DC | PRN

## 2018-08-02 NOTE — Telephone Encounter (Signed)
Called Pt , LAM about [employer] coupon online that will lower her co-pay to $10.00/mo.

## 2018-08-02 NOTE — Telephone Encounter (Signed)
Please advise, [facility] unable to tell which other med will be cheaper.

## 2018-08-05 NOTE — Telephone Encounter (Signed)
Left vm for the pt to call back, need to inform the pt that [place] change rx and sent to [facility] already, [facility] for triage nurse to give detail message.

## 2018-08-05 NOTE — Telephone Encounter (Signed)
Message read to patient. States she was aware and has already picked up the meds from pharmacy.

## 2018-08-13 ENCOUNTER — Other Ambulatory Visit: Payer: Self-pay | Admitting: Internal Medicine

## 2018-08-19 ENCOUNTER — Ambulatory Visit: Payer: BLUE CROSS/BLUE SHIELD | Admitting: Internal Medicine

## 2018-08-27 ENCOUNTER — Ambulatory Visit: Payer: BLUE CROSS/BLUE SHIELD

## 2018-09-14 ENCOUNTER — Other Ambulatory Visit: Payer: Self-pay | Admitting: Internal Medicine

## 2018-09-18 ENCOUNTER — Ambulatory Visit (INDEPENDENT_AMBULATORY_CARE_PROVIDER_SITE_OTHER): Payer: BLUE CROSS/BLUE SHIELD | Admitting: Internal Medicine

## 2018-09-18 ENCOUNTER — Encounter: Payer: Self-pay | Admitting: Internal Medicine

## 2018-09-18 ENCOUNTER — Other Ambulatory Visit: Payer: Self-pay

## 2018-09-18 VITALS — BP 114/60 | HR 67 | Ht 61.0 in | Wt 209.4 lb

## 2018-09-18 DIAGNOSIS — J3089 Other allergic rhinitis: Secondary | ICD-10-CM

## 2018-09-18 DIAGNOSIS — J302 Other seasonal allergic rhinitis: Secondary | ICD-10-CM | POA: Diagnosis not present

## 2018-09-18 DIAGNOSIS — J449 Chronic obstructive pulmonary disease, unspecified: Secondary | ICD-10-CM

## 2018-09-18 MED ORDER — METHYLPREDNISOLONE ACETATE 80 MG/ML IJ SUSP
80.0000 mg | Freq: Once | INTRAMUSCULAR | Status: AC
  Administered 2018-09-18: 10:00:00 80 mg via INTRAMUSCULAR

## 2018-09-18 NOTE — Patient Instructions (Signed)
Order- depo 80   Dx exacerbation asthma-COPD overlap   It may help to use an otc antihistamine like Claritin (loratadine) or Allegra9 fexofenadine)                                A medicated nasal spray like Flonase (fluticasone)  1-2 puffs each nostril once daily, maybe at bedtime.   Please call if we can help

## 2018-09-18 NOTE — Progress Notes (Signed)
HPI female never smoker/+ secondhand, followed for COPD with asthma, probably chronic fixed asthma PFT 03/10/2014-severe obstructive airways disease, no response to BD, normal TLC, normal DLCO. FVC 2.22/84%, FEV1 0.95/44%, ratio 0.43 Office Spirometry-08/02/2015-severe obstructive airways disease.(Done after Xopenex nebulizer treatment) FVC 2.58/102%, FEV1 1.00/47%, FEV1/FVC 0.39, 25-75 0.41/14% ECHO- 06/23/16- EF 65-70 percent a1AT- WNL 124 MM 07/10/12 Allergy Profile 12/05/13-total IgE normal 101 with minimal elevations for dust mite and [place] pollen. CBC  normal eosinophils Office Spirometry 07/04/16- moderate obstructive airways disease-FVC 2.59/100%, FEV1 1.31/63%, ratio 0.51, FEF 25-75% 0.69/30% -------------------------------------------------------------------------------------------------------.  02/15/2018- 53 year old female never smoker/+ secondhand, followed for COPD with asthma, allergic rhinitis Trelegy sample, Singulair, pro-air HFA, Flonase, nebulizer albuterol,  She has part of a Trelegy sample and one that she bought, but has been using it only occasionally.  I discussed the concept of a maintenance inhaler.  No recent use of albuterol or her nebulizer.  She has little cough or wheeze and no acute events.  09/18/2018- 53 year old female never smoker/+ secondhand, followed for COPD with asthma, allergic rhinitis, obesity -----pt states she's had cough & allergy flare for 2 weeks, taking OTC cough drops  Trelegy, Singulair, Flonase, Neb albuterol, ProAir hfa Dry cough x 2 weeks, some nasal congestion and drainage "pollen", w/o fever or sore throat.  Using Trelegy, she hasn't needed rescue or neb recently.  Treats nasal symptoms otc w saline spray, cough drops.   ROS-see HPI   + = positive Constitutional:   No-   weight loss, night sweats, fevers, chills, fatigue, lassitude. HEENT:   No-  headaches, difficulty swallowing, tooth/dental problems, +sore throat,       sneezing,  itching, ear ache, +nasal congestion, post nasal drip,  CV:  No-   chest pain, orthopnea, PND, swelling in lower extremities, anasarca, dizziness, palpitations Resp: +  shortness of breath with exertion or at rest.               productive cough, + non-productive cough,  No- coughing up of blood.              No-   change in color of mucus.  + wheezing.   Skin: No-   rash or lesions. GI:  No-   heartburn, indigestion, abdominal pain, nausea, vomiting,  GU: n. MS:  No-   joint pain or swelling.   Neuro-     nothing unusual Psych:  No- change in mood or affect. No depression or anxiety.  No memory loss.  OBJ- Physical Exam    General- Alert, Oriented, Affect-appropriate, Distress- none acute, + overweight Skin- rash-none, lesions- none, excoriation- none Lymphadenopathy- none Head- atraumatic            Eyes- Gross vision intact, PERRLA, conjunctivae and secretions clear            Ears- Hearing, canals-normal            Nose- Clear, no-Septal dev, mucus, polyps, erosion, perforation. + nasal crease             Throat- Mallampati III , mucosa-, drainage- none, tonsils- atrophic, own teeth Neck- flexible , trachea midline, no stridor , thyroid nl, carotid no bruit Chest - symmetrical excursion , unlabored           Heart/CV- RRR , no murmur , no gallop  , no rub, nl s1 s2                           -  JVD- none , edema- none, stasis changes- none, varices- none           Lung-  Clear/ quiet, wheeze-none, cough-none, dullness-none, rub- none           Chest wall-  Abd-  Br/ Gen/ Rectal- Not done, not indicated Extrem- cyanosis- none, clubbing, none, atrophy- none, strength- nl Neuro- grossly intact to observation

## 2018-10-23 NOTE — Assessment & Plan Note (Signed)
Seasonal exacerbation Plan- flonase, claritin, depomedrol

## 2018-10-23 NOTE — Assessment & Plan Note (Signed)
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  Trelegy working well.  Plan- depo 80 for mild exacerbation

## 2018-11-05 ENCOUNTER — Other Ambulatory Visit: Payer: Self-pay | Admitting: *Deleted

## 2018-11-05 DIAGNOSIS — Z20822 Contact with and (suspected) exposure to covid-19: Secondary | ICD-10-CM

## 2018-11-07 NOTE — Addendum Note (Signed)
Addended by: Brigitte Pulse on: 11/07/2018 03:10 PM   Modules accepted: Orders

## 2018-11-08 ENCOUNTER — Ambulatory Visit: Payer: Self-pay | Admitting: *Deleted

## 2018-11-08 NOTE — Telephone Encounter (Signed)
Pt called in requesting an appt with PCP for COVID-19 testing.   I let her know to call back on Monday because the office is closed until then for the July 4th holiday.   She was agreeable to this plan.    No symptoms or exposures.   Reason for Disposition . Requesting regular office appointment  Answer Assessment - Initial Assessment Questions 1. REASON FOR CALL or QUESTION: "What is your reason for calling today?" or "How can I best help you?" or "What question do you have that I can help answer?"     Requesting an appt with PCP for COVID-19 testing.  No symptoms or exposures.  Protocols used: INFORMATION ONLY CALL-A-AH

## 2018-12-03 ENCOUNTER — Ambulatory Visit: Payer: BLUE CROSS/BLUE SHIELD | Admitting: Internal Medicine

## 2018-12-30 ENCOUNTER — Telehealth: Payer: Self-pay | Admitting: Internal Medicine

## 2018-12-30 NOTE — Telephone Encounter (Signed)
Returned call to patient but no answer.  Left voicemail.

## 2018-12-31 ENCOUNTER — Telehealth (INDEPENDENT_AMBULATORY_CARE_PROVIDER_SITE_OTHER): Payer: BC Managed Care – PPO | Admitting: Adult Health

## 2018-12-31 ENCOUNTER — Encounter: Payer: Self-pay | Admitting: Adult Health

## 2018-12-31 DIAGNOSIS — J301 Allergic rhinitis due to pollen: Secondary | ICD-10-CM

## 2018-12-31 DIAGNOSIS — J4541 Moderate persistent asthma with (acute) exacerbation: Secondary | ICD-10-CM | POA: Diagnosis not present

## 2018-12-31 MED ORDER — PREDNISONE 10 MG PO TABS: ORAL_TABLET | ORAL | 0 refills | Status: DC

## 2018-12-31 NOTE — Telephone Encounter (Signed)
Needs visit , since URI sx can set up for video visit , if declines can come in for visit with APP with additional PPE for sick visit   Please contact office for sooner follow up if symptoms do not improve or worsen or seek emergency care

## 2018-12-31 NOTE — Telephone Encounter (Signed)
Called and spoke w/ pt regarding TP's recommendations. Pt verbalized understanding and agreed to setting up MyChart video visit for today 12/31/2018 at 1:30 PM EDT w/ TP. I went through the steps for MyChart video visit with pt as it is her first time utilizing this. I also let her know if she has any trouble that TP's nurse would be calling ahead of time to get her ready for the virtual visit. Pt expressed understanding.   MyChart video visit appt has been scheduled. Routing to TP as an [place]. Nothing further needed at this time.

## 2018-12-31 NOTE — Progress Notes (Signed)
Virtual Visit via Video Note  I connected with Herold Harms on 12/31/18 at  1:30 PM EDT by a video enabled telemedicine application and verified that I am speaking with the correct person using two identifiers.  Location: Patient: Home  Provider: Office    I discussed the limitations of evaluation and management by telemedicine and the availability of in person appointments. The patient expressed understanding and agreed to proceed.  History of Present Illness: 53 year old female never smoker.  (Secondhand smoke exposure) followed for chronic obstructive asthma, allergic rhinitis  Today's virtual video visit as for an acute office visit for asthma flare Patient has underlying chronic obstructive asthma is maintained on Trelegy daily.  Patient says over the last 2 weeks she has had worsening shortness of breath, wheezing and cough.  Has increased nasal drainage . Mucus is clear. No fever. Has started Qvar in addition to her Trelegy without much improvement.  Is having to use her albuterol inhaler more often,usually does not have to use. No known sick contacts . No fever. Is social distancing and being careful with COVID 19 virus.  Has been doing well until last 2 weeks . Feels the weather is contributing to symptoms . Has had flares of asthma this time of year in past.      Observations/Objective:  PFT 03/10/2014-severe obstructive airways disease, no response to BD, normal TLC, normal DLCO. FVC 2.22/84%, FEV1 0.95/44%, ratio 0.43 Office Spirometry-08/02/2015-severe obstructive airways disease.(Done after Xopenex nebulizer treatment) FVC 2.58/102%, FEV1 1.00/47%, FEV1/FVC 0.39, 25-75 0.41/14% ECHO- 06/23/16- EF 65-70 percent a1AT- WNL 124 MM 07/10/12 Allergy Profile 12/05/13-total IgE normal 101 with minimal elevations for dust mite and [place] pollen. CBC  normal eosinophils Office Spirometry 07/04/16- moderate obstructive airways disease-FVC 2.59/100%, FEV1 1.31/63%, ratio 0.51, FEF 25-75%  0.69/30%  Assessment and Plan: Acute asthma and allergic rhinitis flare Asthma action plan discussed Add Claritin.  Empiric steroid trial.  Patient education on steroids  Plan  Patient Instructions  Prednisone taper over next week.  Add Claritin 10mg  At bedtime   Continue on TRELEGY 1 puff daily , rinse after use.  Continue on Flonase daily  Continue on Singulair daily .  Restart on Pepcid 10mg  Twice daily for 2 weeks then As needed   Finish QVAR sample 2 puffs for 2 weeks then stop .  Rinse after all inhalers  Use ProAir 2 puffs every 4hr as needed. This is your rescue inhalers.  Follow up Dr. Annamaria Boots in 3 months and As needed   Please contact office for sooner follow up if symptoms do not improve or worsen or seek emergency care        Follow Up Instructions: Follow-up in 3 months and as needed  Please contact office for sooner follow up if symptoms do not improve or worsen or seek emergency care    I discussed the assessment and treatment plan with the patient. The patient was provided an opportunity to ask questions and all were answered. The patient agreed with the plan and demonstrated an understanding of the instructions.   The patient was advised to call back or seek an in-person evaluation if the symptoms worsen or if the condition fails to improve as anticipated.  I provided 22  minutes of non-face-to-face time during this encounter.   Rexene Edison, NP

## 2018-12-31 NOTE — Telephone Encounter (Signed)
Patient is returning the call. CB is [phone].

## 2018-12-31 NOTE — Telephone Encounter (Signed)
Primary Pulmonologist: CY Last office visit and with whom: 09/18/2018 w/ CY What do we see them for (pulmonary problems): Asthma-COPD overlap syndrome, seasonal and perennial allergic rhinitis  Reason for call: Pt states she has been having shortness of breath on exertion, mild wheeze, and postnasal drainage that keeps her sleeping upright at night for 2 weeks now. She denies fever/chills/muscle aches. Denies chest pain/chest tightness. She reports taking three inhalers--Trelegy once in the morning, Qvar once in the afternoon if the Trelegy doesn't help her, and albuterol rescue inhaler as needed (she states she's had to use her rescue inhaler 3 times in 1 week). She further notes she takes cough drops, Mucinex, and Claritin at night; however, these therapies are not working for her. She states prednisone has helped her in the past and would like to see if this can be prescribed for her.   In the last month, have you been in contact with someone who was confirmed or suspected to have [place] / COVID-19?  No  Do you have any of the following symptoms developed in the last 30 days? Fever: No Cough: No Shortness of breath: Yes, on exertion  When did your symptoms start? 2 weeks ago If the patient has a fever, what is the last reading?  (use n/a if patient denies fever)  N/A . IF THE PATIENT STATES THEY DO NOT OWN A THERMOMETER, THEY MUST GO AND PURCHASE ONE When did the fever start?: N/A Have you taken any medication to suppress a fever (ie Ibuprofen, Aleve, Tylenol)?: N/A  I let pt know I would get this message routed to TP, as CY is not in the office today and TP was the last provider to see her besides CY. Pt expressed understanding and inquired if she needs to be seen by TP since TP typically does not prefer to prescribe her prednisone.  TP, please advise with your recommendations for this pt. Thank you.

## 2018-12-31 NOTE — Patient Instructions (Addendum)
Prednisone taper over next week.  Add Claritin 10mg  At bedtime   Continue on TRELEGY 1 puff daily , rinse after use.  Continue on Flonase daily  Continue on Singulair daily .  Restart on Pepcid 10mg  Twice daily for 2 weeks then As needed   Finish QVAR sample 2 puffs for 2 weeks then stop .  Rinse after all inhalers  Use ProAir 2 puffs every 4hr as needed. This is your rescue inhalers.  Follow up Dr. Annamaria Boots in 3 months and As needed   Please contact office for sooner follow up if symptoms do not improve or worsen or seek emergency care

## 2019-02-10 ENCOUNTER — Telehealth: Payer: Self-pay | Admitting: Internal Medicine

## 2019-02-11 NOTE — Telephone Encounter (Signed)
Spoke with the pt and scheduled appt for flu shot  Nothing further needed

## 2019-02-11 NOTE — Telephone Encounter (Signed)
Pt returned call and would like call

## 2019-02-11 NOTE — Telephone Encounter (Signed)
LMTCB x1 for pt.  

## 2019-02-12 ENCOUNTER — Other Ambulatory Visit: Payer: Self-pay

## 2019-02-12 ENCOUNTER — Ambulatory Visit (INDEPENDENT_AMBULATORY_CARE_PROVIDER_SITE_OTHER): Payer: BC Managed Care – PPO

## 2019-02-12 DIAGNOSIS — Z23 Encounter for immunization: Secondary | ICD-10-CM

## 2019-04-10 ENCOUNTER — Telehealth: Payer: Self-pay | Admitting: Internal Medicine

## 2019-04-10 NOTE — Telephone Encounter (Signed)
Spoke with the pt  She states calling as FYI to let us know that she has changed her insurance company and that we may receive a request for information  Nothing further needed

## 2019-05-06 ENCOUNTER — Telehealth: Payer: Self-pay | Admitting: Internal Medicine

## 2019-05-06 NOTE — Telephone Encounter (Signed)
Called patient and advised her of the response received from Dr. Annamaria Boots. She voiced understanding. Nothing further needed at this time.

## 2019-05-06 NOTE — Telephone Encounter (Signed)
Most recent pulmonary function test in 2018- FEV1 was 63% Most recent oral steroids (prednisone)  August, 2020.

## 2019-05-06 NOTE — Telephone Encounter (Signed)
Spoke with pt, states that she is shopping for life insurance and is coming across many questions regarding her health. 1. Has pt had a FEV performed, and was the percentage 80% or higher? 2. Is/has patient been treated with corticosteroid therapy (oral or IV) for her respiratory needs? 3. PFT report needs to be printed and sent in.    Most recent PFT (03/2014) has been printed and placed up front for pickup.   CY please advise on questions 1 and 2.  Thanks!

## 2019-05-12 ENCOUNTER — Encounter: Payer: Self-pay | Admitting: Family Medicine

## 2019-05-12 ENCOUNTER — Other Ambulatory Visit: Payer: Self-pay

## 2019-05-12 ENCOUNTER — Emergency Department (INDEPENDENT_AMBULATORY_CARE_PROVIDER_SITE_OTHER)
Admission: EM | Admit: 2019-05-12 | Discharge: 2019-05-12 | Disposition: A | Discharge status: Home / Self Care | Payer: Self-pay | Source: Home / Self Care

## 2019-05-12 DIAGNOSIS — J441 Chronic obstructive pulmonary disease with (acute) exacerbation: Secondary | ICD-10-CM

## 2019-05-12 DIAGNOSIS — Z20822 Contact with and (suspected) exposure to covid-19: Secondary | ICD-10-CM

## 2019-05-12 NOTE — ED Provider Notes (Signed)
[facility]    CSN: [id number] Arrival date & time: 05/12/19  1244      History   Chief Complaint Chief Complaint  Patient presents with  . exposure to COVID    HPI Jocelyn Myers is a 54 y.o. female.   Initial [facility] visit for this 54 yo woman seeking Covid 19 testing.  Pt c/o sore throat and runny nose x 2 days. Husband exposed to [place].   Patient's husband was in earlier for Covid testing.  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  No fever, loss of smell, diarrhea.  Patient has COPD and uses an inhaler, but breathing has not changed despite having chronic cough.     Past Medical History:  Diagnosis Date  . Asthma   . COPD (chronic obstructive pulmonary disease) [facility])     Patient Active Problem List   Diagnosis Date Noted  . Headache 07/05/2018  . Seasonal and perennial allergic rhinitis 07/04/2016  . Obesity (BMI 35.0-39.9 without comorbidity) 02/23/2014  . Asthma-COPD overlap syndrome ([place]) 06/06/2012  . S/P hysterectomy- [facility] (10/29) 03/06/2011    Past Surgical History:  Procedure Laterality Date  . ABDOMINAL HYSTERECTOMY     66yrs ago, ovaries present, benign pathology per patient  . DILATION AND CURETTAGE OF UTERUS  2009   polyp removed  . TUBAL LIGATION      OB History   No obstetric history on file.      Home Medications    Prior to Admission medications   Medication Sig Start Date End Date Taking? Authorizing Provider  albuterol (PROAIR HFA) 108 (90 BASE) MCG/ACT inhaler Inhale 2 puffs into the lungs every 6 (six) hours as needed for wheezing or shortness of breath. 04/29/13  Yes Noralee Space, MD  famotidine (PEPCID) 20 MG tablet Take 1 tablet by mouth as needed. 11/19/16  Yes [provider]  fluticasone (FLONASE) 50 MCG/ACT nasal spray Place 2 sprays into both nostrils daily. 06/26/16  Yes Erick Colace, NP  guaiFENesin (MUCINEX) 600 MG 12 hr tablet Take 1 tablet (600 mg total) by mouth 2 (two) times daily. 06/26/16  Yes Erick Colace, NP  montelukast (SINGULAIR) 10 MG tablet TAKE 1 TABLET BY MOUTH EVERYDAY AT BEDTIME 08/13/18  Yes Young, Tarri Fuller D, MD  Multiple Vitamin (MULTIVITAMIN) tablet Take 1 tablet by mouth daily.   Yes [provider]  predniSONE (DELTASONE) 10 MG tablet 4 tabs for 2 days, then 3 tabs for 2 days, 2 tabs for 2 days, then 1 tab for 2 days, then stop 12/31/18  Yes Parrett, Tammy S, NP  TRELEGY ELLIPTA 100-62.5-25 MCG/INH AEPB TAKE 1 PUFF BY MOUTH EVERY DAY 09/16/18  Yes Young, Clinton D, MD  albuterol (PROVENTIL) (2.5 MG/3ML) 0.083% nebulizer solution Take 3 mLs (2.5 mg total) by nebulization every 4 (four) hours as needed for wheezing or shortness of breath. 06/26/16   Erick Colace, NP    Family History Family History  Problem Relation Age of Onset  . Asthma Son     Social History Social History   Tobacco Use  . Smoking status: Never Smoker  . Smokeless tobacco: Never Used  Substance Use Topics  . Alcohol use: No  . Drug use: No     Allergies   Benadryl [diphenhydramine hcl]   Review of Systems Review of Systems   Physical Exam Triage Vital Signs ED Triage Vitals  Enc Vitals Group     BP      Pulse  Resp      Temp      Temp src      SpO2      Weight      Height      Head Circumference      Peak Flow      Pain Score      Pain Loc      Pain Edu?      Excl. in [place]?    No data found.  Updated Vital Signs BP 115/76 (BP Location: Right Arm)   Pulse 69   Temp 98.1 F (36.7 C) (Oral)   Resp 16   Ht 5' 1.5" (1.562 m)   Wt 93.9 kg   LMP 02/22/2011   SpO2 100%   BMI 38.48 kg/m    Physical Exam Vitals and nursing note reviewed.  Constitutional:      Appearance: Normal appearance.  HENT:     Head: Normocephalic.  Eyes:     Conjunctiva/sclera: Conjunctivae normal.  Cardiovascular:     Rate and Rhythm: Normal rate and regular rhythm.     Pulses: Normal pulses.     Heart sounds: Normal heart sounds.  Pulmonary:     Effort: Pulmonary effort  is normal.     Breath sounds: Normal breath sounds.  Musculoskeletal:        General: Normal range of motion.     Cervical back: Normal range of motion and neck supple.  Skin:    General: Skin is warm and dry.  Neurological:     General: No focal deficit present.     Mental Status: She is alert.  Psychiatric:        Mood and Affect: Mood normal.        Behavior: Behavior normal.        Thought Content: Thought content normal.      UC Treatments / Results  Labs (all labs ordered are listed, but only abnormal results are displayed) Labs Reviewed  NOVEL CORONAVIRUS, NAA    EKG   Radiology No results found.  Procedures Procedures (including critical care time)  Medications Ordered in UC Medications - No data to display  Initial Impression / Assessment and Plan / UC Course  I have reviewed the triage vital signs and the nursing notes.  Pertinent labs & imaging results that were available during my care of the patient were reviewed by me and considered in my medical decision making (see chart for details).    Final Clinical Impressions(s) / UC Diagnoses   Final diagnoses:  Exposure to COVID-19 virus  COPD exacerbation [facility])     Discharge Instructions     Vitamin D3 5000 IU (125 mg) daily Vitamin C 500 mg twice daily Zinc 50 to 75 mg daily  Listerine type mouthwash 4 times a day  COVID-19 infusion hotline:  [phone]     ED Prescriptions    None     I have reviewed the PDMP during this encounter.   Robyn Haber, MD 05/12/19 1353

## 2019-05-12 NOTE — Discharge Instructions (Addendum)
Vitamin D3 5000 IU (125 mg) daily Vitamin C 500 mg twice daily Zinc 50 to 75 mg daily  Listerine type mouthwash 4 times a day  COVID-19 infusion hotline:  [phone]

## 2019-05-12 NOTE — ED Triage Notes (Signed)
Pt c/o sore throat and runny nose x 2 days. Husband exposed to [place].

## 2019-05-14 LAB — NOVEL CORONAVIRUS, NAA: SARS-CoV-2, NAA: NOT DETECTED

## 2019-05-27 ENCOUNTER — Other Ambulatory Visit: Payer: Self-pay | Admitting: Nurse Practitioner

## 2019-05-27 DIAGNOSIS — Z1231 Encounter for screening mammogram for malignant neoplasm of breast: Secondary | ICD-10-CM

## 2019-05-28 ENCOUNTER — Telehealth: Payer: Self-pay | Admitting: Nurse Practitioner

## 2019-05-28 NOTE — Telephone Encounter (Signed)
Charlotte please advise, ok to change order for MM?

## 2019-05-28 NOTE — Telephone Encounter (Signed)
Jocelyn Myers is to schedule F2F appt

## 2019-05-28 NOTE — Telephone Encounter (Signed)
Patient order for mammogram was put in as a screening. Patient was informed to call office and have order changed to a diagnotic due to finding lump in left breast. Please call patient [phone].

## 2019-06-02 ENCOUNTER — Other Ambulatory Visit: Payer: Self-pay

## 2019-06-02 ENCOUNTER — Ambulatory Visit (INDEPENDENT_AMBULATORY_CARE_PROVIDER_SITE_OTHER): Payer: 59 | Admitting: Nurse Practitioner

## 2019-06-02 ENCOUNTER — Encounter: Payer: Self-pay | Admitting: Nurse Practitioner

## 2019-06-02 VITALS — BP 100/72 | HR 79 | Temp 96.1°F | Ht 61.5 in | Wt 205.0 lb

## 2019-06-02 DIAGNOSIS — N6324 Unspecified lump in the left breast, lower inner quadrant: Secondary | ICD-10-CM

## 2019-06-02 NOTE — Progress Notes (Signed)
Subjective:  Patient ID: Jocelyn Myers, female    DOB: 1965/10/09  Age: 54 y.o. MRN: [id number]  CC: Mass (notice mass on left side breast,denied any pain/ 1 week ago/)  HPI Ms. Kross present with left breast mass x1week, no associated with pain or nipple discharge or swollen lymph node.  no previous mammogram. Amenorrhea due to s/p hysterectomy.  no estrogen use, no FHx of breast cancer  Reviewed past Medical, Social and Family history today.  Outpatient Medications Prior to Visit  Medication Sig Dispense Refill  . albuterol (PROAIR HFA) 108 (90 BASE) MCG/ACT inhaler Inhale 2 puffs into the lungs every 6 (six) hours as needed for wheezing or shortness of breath. 3 Inhaler 1  . albuterol (PROVENTIL) (2.5 MG/3ML) 0.083% nebulizer solution Take 3 mLs (2.5 mg total) by nebulization every 4 (four) hours as needed for wheezing or shortness of breath. 75 mL 12  . famotidine (PEPCID) 20 MG tablet Take 1 tablet by mouth as needed.    . fluticasone (FLONASE) 50 MCG/ACT nasal spray Place 2 sprays into both nostrils daily. 16 g 2  . guaiFENesin (MUCINEX) 600 MG 12 hr tablet Take 1 tablet (600 mg total) by mouth 2 (two) times daily.    . montelukast (SINGULAIR) 10 MG tablet TAKE 1 TABLET BY MOUTH EVERYDAY AT BEDTIME 90 tablet 1  . Multiple Vitamin (MULTIVITAMIN) tablet Take 1 tablet by mouth daily.    . TRELEGY ELLIPTA 100-62.5-25 MCG/INH AEPB TAKE 1 PUFF BY MOUTH EVERY DAY 180 each 1  . predniSONE (DELTASONE) 10 MG tablet 4 tabs for 2 days, then 3 tabs for 2 days, 2 tabs for 2 days, then 1 tab for 2 days, then stop (Patient not taking: Reported on 06/02/2019) 20 tablet 0   No facility-administered medications prior to visit.    ROS See HPI  Objective:  BP 100/72   Pulse 79   Temp (!) 96.1 F (35.6 C) (Tympanic)   Ht 5' 1.5" (1.562 m)   Wt 205 lb (93 kg)   LMP 02/22/2011   SpO2 99%   BMI 38.11 kg/m   BP Readings from Last 3 Encounters:  06/02/19 100/72  05/12/19 115/76  09/18/18  114/60    Wt Readings from Last 3 Encounters:  06/02/19 205 lb (93 kg)  05/12/19 207 lb (93.9 kg)  09/18/18 209 lb 6.4 oz (95 kg)    Physical Exam Cardiovascular:     Rate and Rhythm: Normal rate and regular rhythm.  Pulmonary:     Effort: Pulmonary effort is normal.     Breath sounds: Normal breath sounds.  Chest:     Breasts:        Right: Normal.        Left: Mass present. No swelling, bleeding, inverted nipple, nipple discharge, skin change or tenderness.    Lymphadenopathy:     Cervical: No cervical adenopathy.  Neurological:     Mental Status: She is alert and oriented to person, place, and time.  Psychiatric:        Mood and Affect: Mood normal.        Behavior: Behavior normal.        Thought Content: Thought content normal.     Lab Results  Component Value Date   WBC 5.1 07/17/2018   HGB 12.0 07/17/2018   HCT 37.5 07/17/2018   PLT 151.0 07/17/2018   GLUCOSE 81 07/17/2018   CHOL 172 07/17/2018   TRIG 56.0 07/17/2018   HDL 62.90 07/17/2018  LDLCALC 98 07/17/2018   ALT 21 07/17/2018   AST 18 07/17/2018   NA 138 07/17/2018   K 4.2 07/17/2018   CL 107 07/17/2018   CREATININE 0.50 07/17/2018   BUN 15 07/17/2018   CO2 24 07/17/2018   TSH 1.85 07/17/2018    Assessment & Plan:  This visit occurred during the SARS-CoV-2 public health emergency.  Safety protocols were in place, including screening questions prior to the visit, additional usage of staff PPE, and extensive cleaning of exam room while observing appropriate contact time as indicated for disinfecting solutions.   Jocelyn Myers was seen today for mass.  Diagnoses and all orders for this visit:  Mass of lower inner quadrant of left breast -     US BREAST LTD UNI LEFT INC AXILLA; Future -     MM Digital Diagnostic Bilat; Future -     MM Digital Diagnostic Unilat L; Future   I am having Buffie A. Minichiello maintain her albuterol, albuterol, guaiFENesin, fluticasone, multivitamin, famotidine, montelukast,  Trelegy Ellipta, and predniSONE.  No orders of the defined types were placed in this encounter.   Problem List Items Addressed This Visit    None    Visit Diagnoses    Mass of lower inner quadrant of left breast    -  Primary   Relevant Orders   US BREAST LTD UNI LEFT INC AXILLA   MM Digital Diagnostic Bilat   MM Digital Diagnostic Unilat L      Follow-up: Return in about 2 months (around 07/31/2019), or if symptoms worsen or fail to improve, for CPE (fasting).  Jocelyn Lacy, NP

## 2019-06-06 ENCOUNTER — Encounter: Payer: Self-pay | Admitting: Nurse Practitioner

## 2019-06-25 ENCOUNTER — Other Ambulatory Visit: Payer: Self-pay | Admitting: Nurse Practitioner

## 2019-06-25 ENCOUNTER — Ambulatory Visit
Admission: RE | Admit: 2019-06-25 | Discharge: 2019-06-25 | Disposition: A | Discharge status: Home / Self Care | Payer: 59 | Source: Ambulatory Visit | Attending: Nurse Practitioner | Admitting: Nurse Practitioner

## 2019-06-25 ENCOUNTER — Other Ambulatory Visit: Payer: Self-pay

## 2019-06-25 DIAGNOSIS — N6324 Unspecified lump in the left breast, lower inner quadrant: Secondary | ICD-10-CM

## 2019-06-25 DIAGNOSIS — R928 Other abnormal and inconclusive findings on diagnostic imaging of breast: Secondary | ICD-10-CM

## 2019-06-26 ENCOUNTER — Other Ambulatory Visit: Payer: 59

## 2019-08-04 ENCOUNTER — Encounter: Payer: 59 | Admitting: Nurse Practitioner

## 2019-08-12 ENCOUNTER — Telehealth: Payer: Self-pay | Admitting: Nurse Practitioner

## 2019-08-12 NOTE — Telephone Encounter (Signed)
Attempted to call patient, no voicemail available. Need to reschedule Physical cancelled from 08/04/19.

## 2019-09-18 ENCOUNTER — Ambulatory Visit: Payer: BLUE CROSS/BLUE SHIELD | Admitting: Internal Medicine

## 2019-09-22 ENCOUNTER — Other Ambulatory Visit: Payer: Self-pay

## 2019-09-22 ENCOUNTER — Encounter: Payer: Self-pay | Admitting: Internal Medicine

## 2019-09-22 ENCOUNTER — Ambulatory Visit: Payer: 59 | Admitting: Internal Medicine

## 2019-09-22 DIAGNOSIS — J3089 Other allergic rhinitis: Secondary | ICD-10-CM | POA: Diagnosis not present

## 2019-09-22 DIAGNOSIS — J449 Chronic obstructive pulmonary disease, unspecified: Secondary | ICD-10-CM | POA: Diagnosis not present

## 2019-09-22 DIAGNOSIS — J302 Other seasonal allergic rhinitis: Secondary | ICD-10-CM | POA: Diagnosis not present

## 2019-09-22 NOTE — Patient Instructions (Signed)
We can continue current meds  I do recommend you get the Covid vaccine  Please call if we can help

## 2019-09-22 NOTE — Progress Notes (Signed)
HPI female never smoker/+ secondhand, followed for COPD with asthma, probably chronic fixed asthma PFT 03/10/2014-severe obstructive airways disease, no response to BD, normal TLC, normal DLCO. FVC 2.22/84%, FEV1 0.95/44%, ratio 0.43 Office Spirometry-08/02/2015-severe obstructive airways disease.(Done after Xopenex nebulizer treatment) FVC 2.58/102%, FEV1 1.00/47%, FEV1/FVC 0.39, 25-75 0.41/14% ECHO- 06/23/16- EF 65-70 percent a1AT- WNL 124 MM 07/10/12 Allergy Profile 12/05/13-total IgE normal 101 with minimal elevations for dust mite and [place] pollen. CBC  normal eosinophils Office Spirometry 07/04/16- moderate obstructive airways disease-FVC 2.59/100%, FEV1 1.31/63%, ratio 0.51, FEF 25-75% 0.69/30% -------------------------------------------------------------------------------------------------------.   09/18/2018- 54 year old female never smoker/+ secondhand, followed for COPD with asthma, allergic rhinitis, obesity -----pt states she's had cough & allergy flare for 2 weeks, taking OTC cough drops  Trelegy, Singulair, Flonase, Neb albuterol, ProAir hfa Dry cough x 2 weeks, some nasal congestion and drainage "pollen", w/o fever or sore throat.  Using Trelegy, she hasn't needed rescue or neb recently.  Treats nasal symptoms otc w saline spray, cough drops.   09/22/19- 54 year old female never smoker/+ secondhand, followed for COPD with asthma, allergic rhinitis, obesity EDvisit for COPD exacerbation in January- Covid negative. Trelegy 100, singulair, ProAir hfa, Neb albuterol, flonase, Given Lilly flowers- odor in home made her cough. Resolved when flowers removed. Otherwise well controlled.  No Covax yet- discussed CXR 08/16/17-  IMPRESSION: No active cardiopulmonary disease.   ROS-see HPI   + = positive Constitutional:   No-   weight loss, night sweats, fevers, chills, fatigue, lassitude. HEENT:   No-  headaches, difficulty swallowing, tooth/dental problems, +sore throat,        sneezing, itching, ear ache, +nasal congestion, post nasal drip,  CV:  No-   chest pain, orthopnea, PND, swelling in lower extremities, anasarca, dizziness, palpitations Resp: +  shortness of breath with exertion or at rest.               productive cough, + non-productive cough,  No- coughing up of blood.              No-   change in color of mucus.  + wheezing.   Skin: No-   rash or lesions. GI:  No-   heartburn, indigestion, abdominal pain, nausea, vomiting,  GU: n. MS:  No-   joint pain or swelling.   Neuro-     nothing unusual Psych:  No- change in mood or affect. No depression or anxiety.  No memory loss.  OBJ- Physical Exam    General- Alert, Oriented, Affect-appropriate, Distress- none acute, + overweight Skin- rash-none, lesions- none, excoriation- none Lymphadenopathy- none Head- atraumatic            Eyes- Gross vision intact, PERRLA, conjunctivae and secretions clear            Ears- Hearing, canals-normal            Nose- Clear, no-Septal dev, mucus, polyps, erosion, perforation. + nasal crease             Throat- Mallampati III , mucosa-, drainage- none, tonsils- atrophic, own teeth Neck- flexible , trachea midline, no stridor , thyroid nl, carotid no bruit Chest - symmetrical excursion , unlabored           Heart/CV- RRR , no murmur , no gallop  , no rub, nl s1 s2                           - JVD- none , edema- none, stasis changes- none,  varices- none           Lung-  Clear/ quiet, wheeze-none, cough-none, dullness-none, rub- none           Chest wall-  Abd-  Br/ Gen/ Rectal- Not done, not indicated Extrem- cyanosis- none, clubbing, none, atrophy- none, strength- nl Neuro- grossly intact to observation

## 2019-11-01 NOTE — Assessment & Plan Note (Signed)
Suggested adding flonase when needed

## 2019-11-01 NOTE — Assessment & Plan Note (Addendum)
Sensitive to strong odors- probably irritant effect, not allergy Plan- discussed avoidance of problem odors, continue current meds. Discussed and recommended covid vaccine

## 2021-01-28 DIAGNOSIS — Z20822 Contact with and (suspected) exposure to covid-19: Secondary | ICD-10-CM | POA: Diagnosis not present

## 2021-03-14 ENCOUNTER — Telehealth: Payer: Self-pay | Admitting: Internal Medicine

## 2021-03-14 MED ORDER — AZITHROMYCIN 250 MG PO TABS: ORAL_TABLET | ORAL | 0 refills | Status: DC

## 2021-03-14 MED ORDER — PREDNISONE 10 MG PO TABS: ORAL_TABLET | ORAL | 0 refills | Status: AC

## 2021-03-14 NOTE — Telephone Encounter (Signed)
Called and spoke with patient who is calling because she states having symptoms of fever, productive cough with yellow sputum and wheezing a lot lately worse when she lays down. Started about 2 weeks ago. Last fever was 100 and that was Friday.  Pharmacy is [facility] [address]   Dr. Annamaria Boots please advise  Also she is overdue for an appt ( LOV: 09/22/19) are you ok with [place] using an RNA slot if needed

## 2021-03-14 NOTE — Telephone Encounter (Signed)
She needs to get Covid tested  Please send Zpak  250 mg, # 6, 2 today then one daily                      Prednisone 10 mg, # 20, 4 X 2 DAYS, 3 X 2 DAYS, 2 X 2 DAYS, 1 X 2 DAYS  Ok to use held spot or see an APP if that will be too long

## 2021-03-14 NOTE — Telephone Encounter (Signed)
Called and spoke with patient to let her know the recs from Dr. Annamaria Boots. She expressed understanding and verified preferred pharmacy. Prescriptions have been sent. She has also been scheduled to see. Dr. Annamaria Boots next week. Nothing further needed at this time.     Next Appt With Pulmonology Baird Lyons, MD)03/21/2021 at  4:30 PM

## 2021-03-16 ENCOUNTER — Ambulatory Visit
Admission: EM | Admit: 2021-03-16 | Discharge: 2021-03-16 | Disposition: A | Discharge status: Home / Self Care | Payer: BC Managed Care – PPO | Attending: Internal Medicine | Admitting: Internal Medicine

## 2021-03-16 ENCOUNTER — Other Ambulatory Visit: Payer: Self-pay

## 2021-03-16 ENCOUNTER — Ambulatory Visit (INDEPENDENT_AMBULATORY_CARE_PROVIDER_SITE_OTHER): Payer: BC Managed Care – PPO

## 2021-03-16 ENCOUNTER — Encounter: Payer: Self-pay | Admitting: Emergency Medicine

## 2021-03-16 DIAGNOSIS — J069 Acute upper respiratory infection, unspecified: Secondary | ICD-10-CM | POA: Diagnosis not present

## 2021-03-16 DIAGNOSIS — H65191 Other acute nonsuppurative otitis media, right ear: Secondary | ICD-10-CM

## 2021-03-16 DIAGNOSIS — R059 Cough, unspecified: Secondary | ICD-10-CM | POA: Diagnosis not present

## 2021-03-16 DIAGNOSIS — R5383 Other fatigue: Secondary | ICD-10-CM

## 2021-03-16 DIAGNOSIS — R509 Fever, unspecified: Secondary | ICD-10-CM | POA: Diagnosis not present

## 2021-03-16 DIAGNOSIS — R053 Chronic cough: Secondary | ICD-10-CM | POA: Diagnosis not present

## 2021-03-16 MED ORDER — AMOXICILLIN-POT CLAVULANATE 875-125 MG PO TABS: 1.0000 | ORAL_TABLET | Freq: Two times a day (BID) | ORAL | 0 refills | Status: DC

## 2021-03-16 MED ORDER — BENZONATATE 100 MG PO CAPS: 100.0000 mg | ORAL_CAPSULE | Freq: Three times a day (TID) | ORAL | 0 refills | Status: DC | PRN

## 2021-03-16 NOTE — Discharge Instructions (Signed)
Your chest x-ray was normal.  You have been prescribed an antibiotic to treat right ear infection as well as a cough medication to take as needed.  Please follow-up if symptoms persist.

## 2021-03-16 NOTE — ED Provider Notes (Signed)
[facility]    CSN: [id number] Arrival date & time: 03/16/21  1157      History   Chief Complaint Chief Complaint  Patient presents with   Cough   Rash    HPI Jocelyn Myers is a 55 y.o. female.   Patient presents with fever, hoarseness, cough, runny nose, chills, fatigue, headache, runny nose, right ear pain that started approximately 2 weeks ago.  Cough is productive with yellow to green sputum.  Patient not sure of T-max at home but states that she "felt feverish".  Denies chest pain, shortness of breath, sore throat.  She reports that she saw her PCP approximately 2 days ago and was started on azithromycin and prednisone steroid taper.  Patient broke out in a rash last night so she stopped taking both medications.  Patient thinks that the rash was a reaction to azithromycin as she has taken prednisone several times in the past with no reaction.  Rash is mildly itchy but has now resolved.   Cough Rash  Past Medical History:  Diagnosis Date   Asthma    COPD (chronic obstructive pulmonary disease) ([place])     Patient Active Problem List   Diagnosis Date Noted   Headache 07/05/2018   Seasonal and perennial allergic rhinitis 07/04/2016   Obesity (BMI 35.0-39.9 without comorbidity) 02/23/2014   Asthma-COPD overlap syndrome ([place]) 06/06/2012   S/P hysterectomy- Davinci (10/29) 03/06/2011    Past Surgical History:  Procedure Laterality Date   ABDOMINAL HYSTERECTOMY     22yrs ago, ovaries present, benign pathology per patient   DILATION AND CURETTAGE OF UTERUS  2009   polyp removed   TUBAL LIGATION      OB History   No obstetric history on file.      Home Medications    Prior to Admission medications   Medication Sig Start Date End Date Taking? Authorizing Provider  amoxicillin-clavulanate (AUGMENTIN) 875-125 MG tablet Take 1 tablet by mouth every 12 (twelve) hours. 03/16/21  Yes Jakiah Goree, Hildred Alamin E, FNP  benzonatate (TESSALON) 100 MG capsule Take 1 capsule  (100 mg total) by mouth every 8 (eight) hours as needed for cough. 03/16/21  Yes Harshil Cavallaro, Michele Rockers, FNP  albuterol (PROAIR HFA) 108 (90 BASE) MCG/ACT inhaler Inhale 2 puffs into the lungs every 6 (six) hours as needed for wheezing or shortness of breath. 04/29/13   Noralee Space, MD  albuterol (PROVENTIL) (2.5 MG/3ML) 0.083% nebulizer solution Take 3 mLs (2.5 mg total) by nebulization every 4 (four) hours as needed for wheezing or shortness of breath. 06/26/16   Erick Colace, NP  famotidine (PEPCID) 20 MG tablet Take 1 tablet by mouth as needed. 11/19/16   [provider]  fluticasone (FLONASE) 50 MCG/ACT nasal spray Place 2 sprays into both nostrils daily. 06/26/16   Erick Colace, NP  guaiFENesin (MUCINEX) 600 MG 12 hr tablet Take 1 tablet (600 mg total) by mouth 2 (two) times daily. 06/26/16   Erick Colace, NP  montelukast (SINGULAIR) 10 MG tablet TAKE 1 TABLET BY MOUTH EVERYDAY AT BEDTIME 08/13/18   Baird Lyons D, MD  Multiple Vitamin (MULTIVITAMIN) tablet Take 1 tablet by mouth daily.    [provider]  predniSONE (DELTASONE) 10 MG tablet Take 4 tablets (40 mg total) by mouth daily with breakfast for 2 days, THEN 3 tablets (30 mg total) daily with breakfast for 2 days, THEN 2 tablets (20 mg total) daily with breakfast for 2 days, THEN 1 tablet (10  mg total) daily with breakfast for 2 days. 03/14/21 03/22/21  Deneise Lever, MD  TRELEGY ELLIPTA 100-62.5-25 MCG/INH AEPB TAKE 1 PUFF BY MOUTH EVERY DAY 09/16/18   Deneise Lever, MD    Family History Family History  Problem Relation Age of Onset   Asthma Son     Social History Social History   Tobacco Use   Smoking status: Never   Smokeless tobacco: Never  Vaping Use   Vaping Use: Never used  Substance Use Topics   Alcohol use: No   Drug use: No     Allergies   Benadryl [diphenhydramine hcl]   Review of Systems Review of Systems Per HPI  Physical Exam Triage Vital Signs ED Triage Vitals  Enc Vitals  Group     BP 03/16/21 1424 109/68     Pulse Rate 03/16/21 1424 80     Resp 03/16/21 1424 16     Temp 03/16/21 1424 (!) 97.4 F (36.3 C)     Temp Source 03/16/21 1424 Oral     SpO2 03/16/21 1424 98 %     Weight --      Height --      Head Circumference --      Peak Flow --      Pain Score 03/16/21 1428 0     Pain Loc --      Pain Edu? --      Excl. in [place]? --    No data found.  Updated Vital Signs BP 109/68 (BP Location: Left Arm)   Pulse 80   Temp (!) 97.4 F (36.3 C) (Oral)   Resp 16   LMP 02/22/2011   SpO2 98%   Visual Acuity Right Eye Distance:   Left Eye Distance:   Bilateral Distance:    Right Eye Near:   Left Eye Near:    Bilateral Near:     Physical Exam Constitutional:      General: She is not in acute distress.    Appearance: Normal appearance. She is not toxic-appearing or diaphoretic.  HENT:     Head: Normocephalic and atraumatic.     Right Ear: Tympanic membrane and ear canal normal.     Left Ear: Tympanic membrane and ear canal normal.     Nose: Congestion present.     Mouth/Throat:     Mouth: Mucous membranes are moist.     Pharynx: No posterior oropharyngeal erythema.  Eyes:     Extraocular Movements: Extraocular movements intact.     Conjunctiva/sclera: Conjunctivae normal.     Pupils: Pupils are equal, round, and reactive to light.  Cardiovascular:     Rate and Rhythm: Normal rate and regular rhythm.     Pulses: Normal pulses.     Heart sounds: Normal heart sounds.  Pulmonary:     Effort: Pulmonary effort is normal. No respiratory distress.     Breath sounds: Normal breath sounds. No stridor. No wheezing, rhonchi or rales.  Abdominal:     General: Abdomen is flat. Bowel sounds are normal.     Palpations: Abdomen is soft.  Musculoskeletal:        General: Normal range of motion.     Cervical back: Normal range of motion.  Skin:    General: Skin is warm and dry.  Neurological:     General: No focal deficit present.     Mental Status:  She is alert and oriented to person, place, and time. Mental status is at baseline.  Psychiatric:  Mood and Affect: Mood normal.        Behavior: Behavior normal.     UC Treatments / Results  Labs (all labs ordered are listed, but only abnormal results are displayed) Labs Reviewed  COVID-19, FLU A+B NAA    EKG   Radiology DG Chest 2 View  Result Date: 03/16/2021 CLINICAL DATA:  Persistent cough. Fever, hoarseness, cough, runny nose, chills, fatigue, dizziness, night sweats, headache over the last 2 weeks. EXAM: CHEST - 2 VIEW COMPARISON:  Chest x-ray 08/16/2017, CT chest 06/23/2016 FINDINGS: The heart and mediastinal contours are within normal limits. No focal consolidation. No pulmonary edema. No pleural effusion. No pneumothorax. No acute osseous abnormality. IMPRESSION: No active cardiopulmonary disease. Electronically Signed   By: Iven Finn M.D.   On: 03/16/2021 15:09    Procedures Procedures (including critical care time)  Medications Ordered in UC Medications - No data to display  Initial Impression / Assessment and Plan / UC Course  I have reviewed the triage vital signs and the nursing notes.  Pertinent labs & imaging results that were available during my care of the patient were reviewed by me and considered in my medical decision making (see chart for details).     Chest x-ray was negative for any acute cardiopulmonary process.  Suspect viral cause to patient's symptoms.  Will treat right ear infection with Augmentin antibiotic and patient is to completely stop azithromycin due to possible allergic reaction.  Advised patient that it may be beneficial to restart prednisone as she has taken this safely in the past before, although this is up to her and her comfort zone with this medication.  Benzonatate also prescribed to take as needed for cough.  COVID-19 and flu swab pending, although may not be accurate given duration of symptoms.  No red flags on exam.   Discussed return precautions.  Patient verbalized understanding and was agreeable with plan. Final Clinical Impressions(s) / UC Diagnoses   Final diagnoses:  Persistent cough  Other non-recurrent acute nonsuppurative otitis media of right ear  Viral upper respiratory infection     Discharge Instructions      Your chest x-ray was normal.  You have been prescribed an antibiotic to treat right ear infection as well as a cough medication to take as needed.  Please follow-up if symptoms persist.     ED Prescriptions     Medication Sig Dispense Auth. Provider   benzonatate (TESSALON) 100 MG capsule Take 1 capsule (100 mg total) by mouth every 8 (eight) hours as needed for cough. 21 capsule Waldron, Shelton E, [place]   amoxicillin-clavulanate (AUGMENTIN) 875-125 MG tablet Take 1 tablet by mouth every 12 (twelve) hours. 14 tablet [place], Michele Rockers, [place]      PDMP not reviewed this encounter.   Teodora Medici,  03/16/21 [phone]

## 2021-03-16 NOTE — ED Triage Notes (Signed)
Fever, hoarseness, cough, runny nose, chills, fatigue, dizziness, night sweats, headache over the last 2 weeks. Seen her PCP Monday, started on a z-pack and prednisone. Broke out in small maculopapular rash all over her body that itched, so she stopped taking the medication.

## 2021-03-17 LAB — COVID-19, FLU A+B NAA
Influenza A, NAA: NOT DETECTED
Influenza B, NAA: NOT DETECTED
SARS-CoV-2, NAA: NOT DETECTED

## 2021-03-19 NOTE — Progress Notes (Signed)
HPI female never smoker/+ secondhand, followed for COPD with asthma, probably chronic fixed asthma PFT 03/10/2014-severe obstructive airways disease, no response to BD, normal TLC, normal DLCO. FVC 2.22/84%, FEV1 0.95/44%, ratio 0.43 Office Spirometry-08/02/2015-severe obstructive airways disease.(Done after Xopenex nebulizer treatment) FVC 2.58/102%, FEV1 1.00/47%, FEV1/FVC 0.39, 25-75 0.41/14% ECHO- 06/23/16- EF 65-70 percent a1AT- WNL 124 MM 07/10/12 Allergy Profile 12/05/13-total IgE normal 101 with minimal elevations for dust mite and [place] pollen. CBC  normal eosinophils Office Spirometry 07/04/16- moderate obstructive airways disease-FVC 2.59/100%, FEV1 1.31/63%, ratio 0.51, FEF 25-75% 0.69/30% -------------------------------------------------------------------------------------------------------.   09/22/19- 55 year old female never smoker/+ secondhand, followed for COPD with asthma, allergic rhinitis, obesity EDvisit for COPD exacerbation in January- Covid negative. Trelegy 100, singulair, ProAir hfa, Neb albuterol, flonase, Given Lilly flowers- odor in home made her cough. Resolved when flowers removed. Otherwise well controlled.  No Covax yet- discussed CXR 08/16/17-  IMPRESSION: No active cardiopulmonary disease.  03/21/21- 55 year old female never smoker/+ secondhand, followed for COPD with asthma, allergic rhinitis, obesity EDvisit for COPD exacerbation in January- Covid negative. -Trelegy 100, singulair, ProAir hfa, Neb albuterol, flonase, ED 03/16/21 for persistent cough>URI> zith (gave rash) and pred taper, Rx'd augmentin        Need to add azithromycin to allergy list- rash Last prednisone taper 11/7             ? Need Biologic? Covid vax-3 Phizer Flu vax-declines -----Patient is here for follow up and has not been feeling well the last 3 weeks. Still taking prednisone. States she got hives from the zpak so stopped taking it. Increased cough with clear phlegm over past 2  weeks.  Somewhat lightheaded.  Not really wheezing.  This began with low-grade fever initially.  She tested negative for COVID.  Has now finished augmentin and almost finished prednisone taper.  GI okay. CXR 03/16/21-  IMPRESSION: No active cardiopulmonary disease.  ROS-see HPI   + = positive Constitutional:   No-   weight loss, night sweats, fevers, chills, fatigue, lassitude. HEENT:   No-  headaches, difficulty swallowing, tooth/dental problems, +sore throat,       sneezing, itching, ear ache, +nasal congestion, post nasal drip,  CV:  No-   chest pain, orthopnea, PND, swelling in lower extremities, anasarca, dizziness, palpitations Resp: +  shortness of breath with exertion or at rest.               productive cough, + non-productive cough,  No- coughing up of blood.              No-   change in color of mucus.  + wheezing.   Skin: No-   rash or lesions. GI:  No-   heartburn, indigestion, abdominal pain, nausea, vomiting,  GU: n. MS:  No-   joint pain or swelling.   Neuro-     nothing unusual Psych:  No- change in mood or affect. No depression or anxiety.  No memory loss.  OBJ- Physical Exam    General- Alert, Oriented, Affect-appropriate, Distress- none acute, + overweight Skin- rash-none, lesions- none, excoriation- none Lymphadenopathy- none Head- atraumatic            Eyes- Gross vision intact, PERRLA, conjunctivae and secretions clear            Ears- Hearing, canals-normal            Nose- Clear, no-Septal dev, mucus, polyps, erosion, perforation. + nasal crease             Throat- Mallampati III ,  mucosa-, drainage- none, tonsils- atrophic, own teeth Neck- flexible , trachea midline, no stridor , thyroid nl, carotid no bruit Chest - symmetrical excursion , unlabored           Heart/CV- RRR , no murmur , no gallop  , no rub, nl s1 s2                           - JVD- none , edema- none, stasis changes- none, varices- none           Lung-  Clear/ quiet, wheeze-none, cough+light,  dullness-none, rub- none           Chest wall-  Abd-  Br/ Gen/ Rectal- Not done, not indicated Extrem- cyanosis- none, clubbing, none, atrophy- none, strength- nl Neuro- grossly intact to observation

## 2021-03-21 ENCOUNTER — Encounter: Payer: Self-pay | Admitting: Internal Medicine

## 2021-03-21 ENCOUNTER — Ambulatory Visit: Payer: BC Managed Care – PPO | Admitting: Internal Medicine

## 2021-03-21 ENCOUNTER — Other Ambulatory Visit: Payer: Self-pay

## 2021-03-21 DIAGNOSIS — J069 Acute upper respiratory infection, unspecified: Secondary | ICD-10-CM

## 2021-03-21 DIAGNOSIS — J449 Chronic obstructive pulmonary disease, unspecified: Secondary | ICD-10-CM | POA: Diagnosis not present

## 2021-03-21 MED ORDER — IPRATROPIUM-ALBUTEROL 0.5-2.5 (3) MG/3ML IN SOLN: RESPIRATORY_TRACT | 12 refills | Status: DC

## 2021-03-21 NOTE — Patient Instructions (Addendum)
Finish the prednisone, then let's see how you do. Ok to use your regular meds, and otc cold and flu remedies as needed.  I would like you try a "Biologic" injection therapy called [place]- start application process for Dupixent  Script sent changing your nebulizer medication to Duoneb (ipratropium-albuterol)  1 neb every 6 hours if needed.  Please call as needed

## 2021-03-22 ENCOUNTER — Telehealth: Payer: Self-pay

## 2021-03-22 NOTE — Telephone Encounter (Signed)
Received New start paperwork for [place]. Will update as we work through the benefits process.   Submitted a Prior Authorization request to [facility] for [place] via CoverMyMeds. Will update once we receive a response.   Key: [id number]

## 2021-03-23 ENCOUNTER — Other Ambulatory Visit (HOSPITAL_COMMUNITY): Payer: Self-pay

## 2021-03-23 NOTE — Telephone Encounter (Addendum)
Received notification from [facility] regarding a prior authorization for [place]. Authorization has been APPROVED from 03/22/2021 to 09/17/2021. Approval letter, copay card and unused DupixentMyWay form sent to scan center for retention.  Per test claim, copay for Loading dose is $100.00  Patient can fill through [place]: [phone]   Authorization # [id number]   DupixentMyWay Copay Card activated for pt. RxBIN: [id number] RxPCN: [facility] RxGRP: [id number] ID: [id number]

## 2021-03-28 NOTE — Telephone Encounter (Signed)
Patient returned call and we reviewed [place] approval. She states she quit her job on 03/25/21 and is unsure when her insurance will expire. I advised her that we can pursue patient assistance for her to receive Dupixent at no cost directly from manufacturer but this requires application to be completed.  She states that she'd like additional information about Dupixent sent to her. I briefly discussed that Dupixent is well-tolerated with minimal side effets - most common being injection site reaction, URTI.  Advised that we would like to hold on starting Dupixent until we know that coverage is in place since it can take 2 weeks to process PAP application  Will mail PAP application to her for [place] as well as Dupixent patient information  Knox Saliva, PharmD, MPH, BCPS Clinical Pharmacist (Rheumatology and Pulmonology)

## 2021-03-28 NOTE — Telephone Encounter (Signed)
ATC patient to schedule Dupixent new start and advise about approval. Unable to reach. Left VM requesting return call to review  Knox Saliva, PharmD, MPH, BCPS Clinical Pharmacist (Rheumatology and Pulmonology)

## 2021-04-19 NOTE — Assessment & Plan Note (Signed)
Most recent illness may have been viral triggered.  Now finishing Augmentin/prednisone.  Not clear if the Augmentin is contributing but she will finish current course, maintain hydration, add symptomatic OTC treatments if needed.

## 2021-04-19 NOTE — Assessment & Plan Note (Signed)
Recent exacerbation  Plan-she has needed prednisone again.  We are going to explore biologic therapy with Dupixent.  She will finish current prednisone course.

## 2021-05-17 NOTE — Telephone Encounter (Signed)
ATC patient to determine Dupixent PAP application status and/or if she has new insurance. Left VM requesting return call for update  Knox Saliva, PharmD, MPH, BCPS Clinical Pharmacist (Rheumatology and Pulmonology)

## 2021-06-07 NOTE — Telephone Encounter (Addendum)
Submitted a Prior Authorization request to [employer] for [place] via CoverMyMeds (new insurance plan). Will update once we receive a response.  Key: [id number]  Knox Saliva, PharmD, MPH, BCPS Clinical Pharmacist (Rheumatology and Pulmonology)

## 2021-06-10 NOTE — Telephone Encounter (Signed)
Received notification from [facility] regarding a prior authorization for Jocelyn Myers. Authorization remains APPROVED through 09/17/21.  Started dose re-approved through 07/07/21  Authorization # [id number] Phone # [phone]  At this point patient has not returned calls regarding Dupixent start. If she would like to start, pharmacy team will need notification from Dr. Annamaria Boots. Pt has f/u on 07/18/21  Knox Saliva, PharmD, MPH, BCPS Clinical Pharmacist (Rheumatology and Pulmonology)

## 2021-07-18 ENCOUNTER — Ambulatory Visit: Payer: BC Managed Care – PPO | Admitting: Internal Medicine

## 2021-08-11 ENCOUNTER — Ambulatory Visit: Payer: Self-pay | Admitting: Internal Medicine

## 2021-08-30 ENCOUNTER — Ambulatory Visit: Payer: Self-pay | Admitting: Internal Medicine

## 2021-09-20 ENCOUNTER — Encounter: Payer: Self-pay | Admitting: Nurse Practitioner

## 2021-10-15 NOTE — Progress Notes (Unsigned)
HPI female never smoker/+ secondhand, followed for COPD with asthma, probably chronic fixed asthma PFT 03/10/2014-severe obstructive airways disease, no response to BD, normal TLC, normal DLCO. FVC 2.22/84%, FEV1 0.95/44%, ratio 0.43 Office Spirometry-08/02/2015-severe obstructive airways disease.(Done after Xopenex nebulizer treatment) FVC 2.58/102%, FEV1 1.00/47%, FEV1/FVC 0.39, 25-75 0.41/14% ECHO- 06/23/16- EF 65-70 percent a1AT- WNL 124 MM 07/10/12 Allergy Profile 12/05/13-total IgE normal 101 with minimal elevations for dust mite and [place] pollen. CBC  normal eosinophils Office Spirometry 07/04/16- moderate obstructive airways disease-FVC 2.59/100%, FEV1 1.31/63%, ratio 0.51, FEF 25-75% 0.69/30% -------------------------------------------------------------------------------------------------------.   03/21/21- 56 year old female never smoker/+ secondhand, followed for COPD with asthma, allergic rhinitis, obesity EDvisit for COPD exacerbation in January- Covid negative. -Trelegy 100, singulair, ProAir hfa, Neb albuterol, flonase, ED 03/16/21 for persistent cough>URI> zith (gave rash) and pred taper, Rx'd augmentin        Need to add azithromycin to allergy list- rash Last prednisone taper 11/7             ? Need Biologic? Covid vax-3 Phizer Flu vax-declines -----Patient is here for follow up and has not been feeling well the last 3 weeks. Still taking prednisone. States she got hives from the zpak so stopped taking it. Increased cough with clear phlegm over past 2 weeks.  Somewhat lightheaded.  Not really wheezing.  This began with low-grade fever initially.  She tested negative for COVID.  Has now finished augmentin and almost finished prednisone taper.  GI okay. CXR 03/16/21-  IMPRESSION: No active cardiopulmonary disease.  10/18/21- 56 year old female never smoker/+ secondhand, followed for COPD with asthma, allergic rhinitis, obesity EDvisit for COPD exacerbation in January- Covid  negative. -Trelegy 100, singulair, ProAir hfa, Neb albuterol, flonase, Last prednisone taper             ? Need Biologic? Covid vax-3 Phizer Flu vax-declines     ROS-see HPI   + = positive Constitutional:   No-   weight loss, night sweats, fevers, chills, fatigue, lassitude. HEENT:   No-  headaches, difficulty swallowing, tooth/dental problems, +sore throat,       sneezing, itching, ear ache, +nasal congestion, post nasal drip,  CV:  No-   chest pain, orthopnea, PND, swelling in lower extremities, anasarca, dizziness, palpitations Resp: +  shortness of breath with exertion or at rest.               productive cough, + non-productive cough,  No- coughing up of blood.              No-   change in color of mucus.  + wheezing.   Skin: No-   rash or lesions. GI:  No-   heartburn, indigestion, abdominal pain, nausea, vomiting,  GU: n. MS:  No-   joint pain or swelling.   Neuro-     nothing unusual Psych:  No- change in mood or affect. No depression or anxiety.  No memory loss.  OBJ- Physical Exam    General- Alert, Oriented, Affect-appropriate, Distress- none acute, + overweight Skin- rash-none, lesions- none, excoriation- none Lymphadenopathy- none Head- atraumatic            Eyes- Gross vision intact, PERRLA, conjunctivae and secretions clear            Ears- Hearing, canals-normal            Nose- Clear, no-Septal dev, mucus, polyps, erosion, perforation. + nasal crease             Throat- Mallampati III , mucosa-, drainage-  none, tonsils- atrophic, own teeth Neck- flexible , trachea midline, no stridor , thyroid nl, carotid no bruit Chest - symmetrical excursion , unlabored           Heart/CV- RRR , no murmur , no gallop  , no rub, nl s1 s2                           - JVD- none , edema- none, stasis changes- none, varices- none           Lung-  Clear/ quiet, wheeze-none, cough+light, dullness-none, rub- none           Chest wall-  Abd-  Br/ Gen/ Rectal- Not done, not  indicated Extrem- cyanosis- none, clubbing, none, atrophy- none, strength- nl Neuro- grossly intact to observation

## 2021-10-18 ENCOUNTER — Encounter: Payer: Self-pay | Admitting: Internal Medicine

## 2021-10-18 ENCOUNTER — Ambulatory Visit: Payer: 59 | Admitting: Internal Medicine

## 2021-10-18 VITALS — BP 118/60 | HR 63 | Temp 97.7°F | Ht 61.5 in | Wt 212.4 lb

## 2021-10-18 DIAGNOSIS — J449 Chronic obstructive pulmonary disease, unspecified: Secondary | ICD-10-CM | POA: Diagnosis not present

## 2021-10-18 DIAGNOSIS — L309 Dermatitis, unspecified: Secondary | ICD-10-CM | POA: Insufficient documentation

## 2021-10-18 LAB — CBC WITH DIFFERENTIAL/PLATELET
Basophils Absolute: 0 10*3/uL (ref 0.0–0.1)
Basophils Relative: 0.6 % (ref 0.0–3.0)
Eosinophils Absolute: 0.1 10*3/uL (ref 0.0–0.7)
Eosinophils Relative: 2.5 % (ref 0.0–5.0)
HCT: 35.4 % — ABNORMAL LOW (ref 36.0–46.0)
Hemoglobin: 11.3 g/dL — ABNORMAL LOW (ref 12.0–15.0)
Lymphocytes Relative: 29.8 % (ref 12.0–46.0)
Lymphs Abs: 1.8 10*3/uL (ref 0.7–4.0)
MCHC: 32 g/dL (ref 30.0–36.0)
MCV: 84.5 fl (ref 78.0–100.0)
Monocytes Absolute: 0.5 10*3/uL (ref 0.1–1.0)
Monocytes Relative: 7.9 % (ref 3.0–12.0)
Neutro Abs: 3.6 10*3/uL (ref 1.4–7.7)
Neutrophils Relative %: 59.2 % (ref 43.0–77.0)
Platelets: 151 10*3/uL (ref 150.0–400.0)
RBC: 4.18 Mil/uL (ref 3.87–5.11)
RDW: 13.2 % (ref 11.5–15.5)
WBC: 6 10*3/uL (ref 4.0–10.5)

## 2021-10-18 NOTE — Assessment & Plan Note (Signed)
Minimal exacerbation which she feels she can control with current meds.  I want to clarify current severity with PFT and look again at allergic status-IgE and eosinophils. Plan-continue current meds.  Schedule PFT, lab for IgE, eosinophils

## 2021-10-18 NOTE — Patient Instructions (Signed)
Order- schedule PFT     dx Asthma COPD overlap  Order- lab- CBC w diff, IgE  We can continue present meds. Please call if we can help

## 2021-10-18 NOTE — Assessment & Plan Note (Signed)
Managed topically by dermatologist

## 2021-10-19 LAB — IGE: IgE (Immunoglobulin E), Serum: 231 kU/L — ABNORMAL HIGH (ref <=114)

## 2021-12-12 ENCOUNTER — Telehealth: Payer: Self-pay | Admitting: Internal Medicine

## 2021-12-12 MED ORDER — DOXYCYCLINE HYCLATE 100 MG PO TABS: 100.0000 mg | ORAL_TABLET | Freq: Two times a day (BID) | ORAL | 0 refills | Status: DC

## 2021-12-12 MED ORDER — BENZONATATE 100 MG PO CAPS: 100.0000 mg | ORAL_CAPSULE | Freq: Three times a day (TID) | ORAL | 0 refills | Status: DC | PRN

## 2021-12-12 NOTE — Telephone Encounter (Signed)
Spoke with pt who states increased productive cough ( bright yellow mucus), increase congestion and chills. Pt is out Tessalon pearls and states Mucinex isn't helping. Pt was advised to take/ get Covid test and let office know results. Dr. Annamaria Boots please advise.

## 2021-12-12 NOTE — Telephone Encounter (Signed)
Doxycycline 100 mg, # 14, 1 twice daily  Tessalon 200, # 40, 1 every 8 hours as needed

## 2021-12-12 NOTE — Telephone Encounter (Signed)
Noted by triage.  

## 2021-12-12 NOTE — Addendum Note (Signed)
Addended by: Gavin Potters R on: 12/12/2021 03:28 PM   Modules accepted: Orders

## 2021-12-12 NOTE — Telephone Encounter (Signed)
ATC LVMTCB x 1  

## 2022-01-04 ENCOUNTER — Telehealth: Payer: Self-pay | Admitting: Internal Medicine

## 2022-01-04 MED ORDER — PREDNISONE 10 MG PO TABS: ORAL_TABLET | ORAL | 0 refills | Status: DC

## 2022-01-04 NOTE — Telephone Encounter (Signed)
Primary Pulmonologist: Young Last office visit and with whom: 10/18/21 Young What do we see them for (pulmonary problems): Asthma-COPD overlap Last OV assessment/plan:    Assessment & Plan Note by Deneise Lever, MD at 10/18/2021 2:27 PM  Author: Deneise Lever, MD Author Type: Physician Filed: 10/18/2021  2:27 PM  Note Status: Written Cosign: Cosign Not Required Encounter Date: 10/18/2021  Problem: Eczema  Editor: Deneise Lever, MD (Physician)               Managed topically by dermatologist        Assessment & Plan Note by Deneise Lever, MD at 10/18/2021 2:26 PM  Author: Deneise Lever, MD Author Type: Physician Filed: 10/18/2021  2:27 PM  Note Status: Written Cosign: Cosign Not Required Encounter Date: 10/18/2021  Problem: Asthma-COPD overlap syndrome [facility])  Editor: Deneise Lever, MD (Physician)               Minimal exacerbation which she feels she can control with current meds.  I want to clarify current severity with PFT and look again at allergic status-IgE and eosinophils. Plan-continue current meds.  Schedule PFT, lab for IgE, eosinophils        Patient Instructions by Deneise Lever, MD at 10/18/2021 2:00 PM  Author: Deneise Lever, MD Author Type: Physician Filed: 10/18/2021  2:19 PM  Note Status: Signed Cosign: Cosign Not Required Encounter Date: 10/18/2021  Editor: Deneise Lever, MD (Physician)               Order- schedule PFT     dx Asthma COPD overlap   Order- lab- CBC w diff, IgE   We can continue present meds. Please call if we can help       Orthostatic Vitals Recorded in This Encounter   10/18/2021  1410     Patient Position: Sitting  BP Location: Left Arm  Cuff Size: Normal   Instructions    Return in about 6 months (around 04/19/2022).  Order- schedule PFT     dx Asthma COPD overlap   Order- lab- CBC w diff, IgE   We can continue present meds. Please call if we can help      Was appointment offered to patient  (explain)?  No, has had cough since end of July, not acutely ill and does not feel bad.   Reason for call: Patient is calling because she's still coughing up bright yellow mucus since end of July.  She is able to lay down and sleep.  Denies fever, chills or body aches.  No recent Covid test.  Denies any sinus congestion or runny nose.  Denies any chest congestion or sob.   Used over the counter Robitussin DM did not help.  She is no longer using the Benzonatate or singular.  She is not having to use the albuterol inhaler or Duoneb.  Denies any sob.  Wants to know if she could get a rx for prednisone to break up the mucous.  Dr. Valeta Harms, please advise.  Thank you.  (examples of things to ask: : When did symptoms start? Fever? Cough? Productive? Color to sputum? More sputum than usual? Wheezing? Have you needed increased oxygen? Are you taking your respiratory medications? What over the counter measures have you tried?)  Allergies  Allergen Reactions   Benadryl [Diphenhydramine Hcl] Hives and Shortness Of Breath   Z-Pak [Azithromycin] Hives    Immunization History  Administered Date(s) Administered   Influenza Split 05/23/2012,  02/01/2015   Influenza,inj,Quad PF,6+ Mos 06/06/2013, 02/23/2014, 02/24/2016, 02/15/2017, 02/15/2018, 02/12/2019   Influenza-Unspecified 01/07/2015   PFIZER(Purple Top)SARS-COV-2 Vaccination 09/24/2019, 10/17/2019, 05/10/2020   Pneumococcal Polysaccharide-23 06/06/2013, 06/24/2016   Tdap 02/23/2014

## 2022-01-04 NOTE — Telephone Encounter (Signed)
Patient is aware of commendations and voiced her understanding.  Prednisone sent to preferred pharmacy. Nothing further needed.

## 2022-03-22 IMAGING — DX DG CHEST 2V
2 series · 2 of 2 positions shown · non-contrast
Comparison: Chest x-ray 08/16/2017, CT chest 06/23/2016

CLINICAL DATA: Persistent cough. Fever, hoarseness, cough, runny
nose, chills, fatigue, dizziness, night sweats, headache over the
last 2 weeks.

EXAM:
CHEST - 2 VIEW

[chest pa]
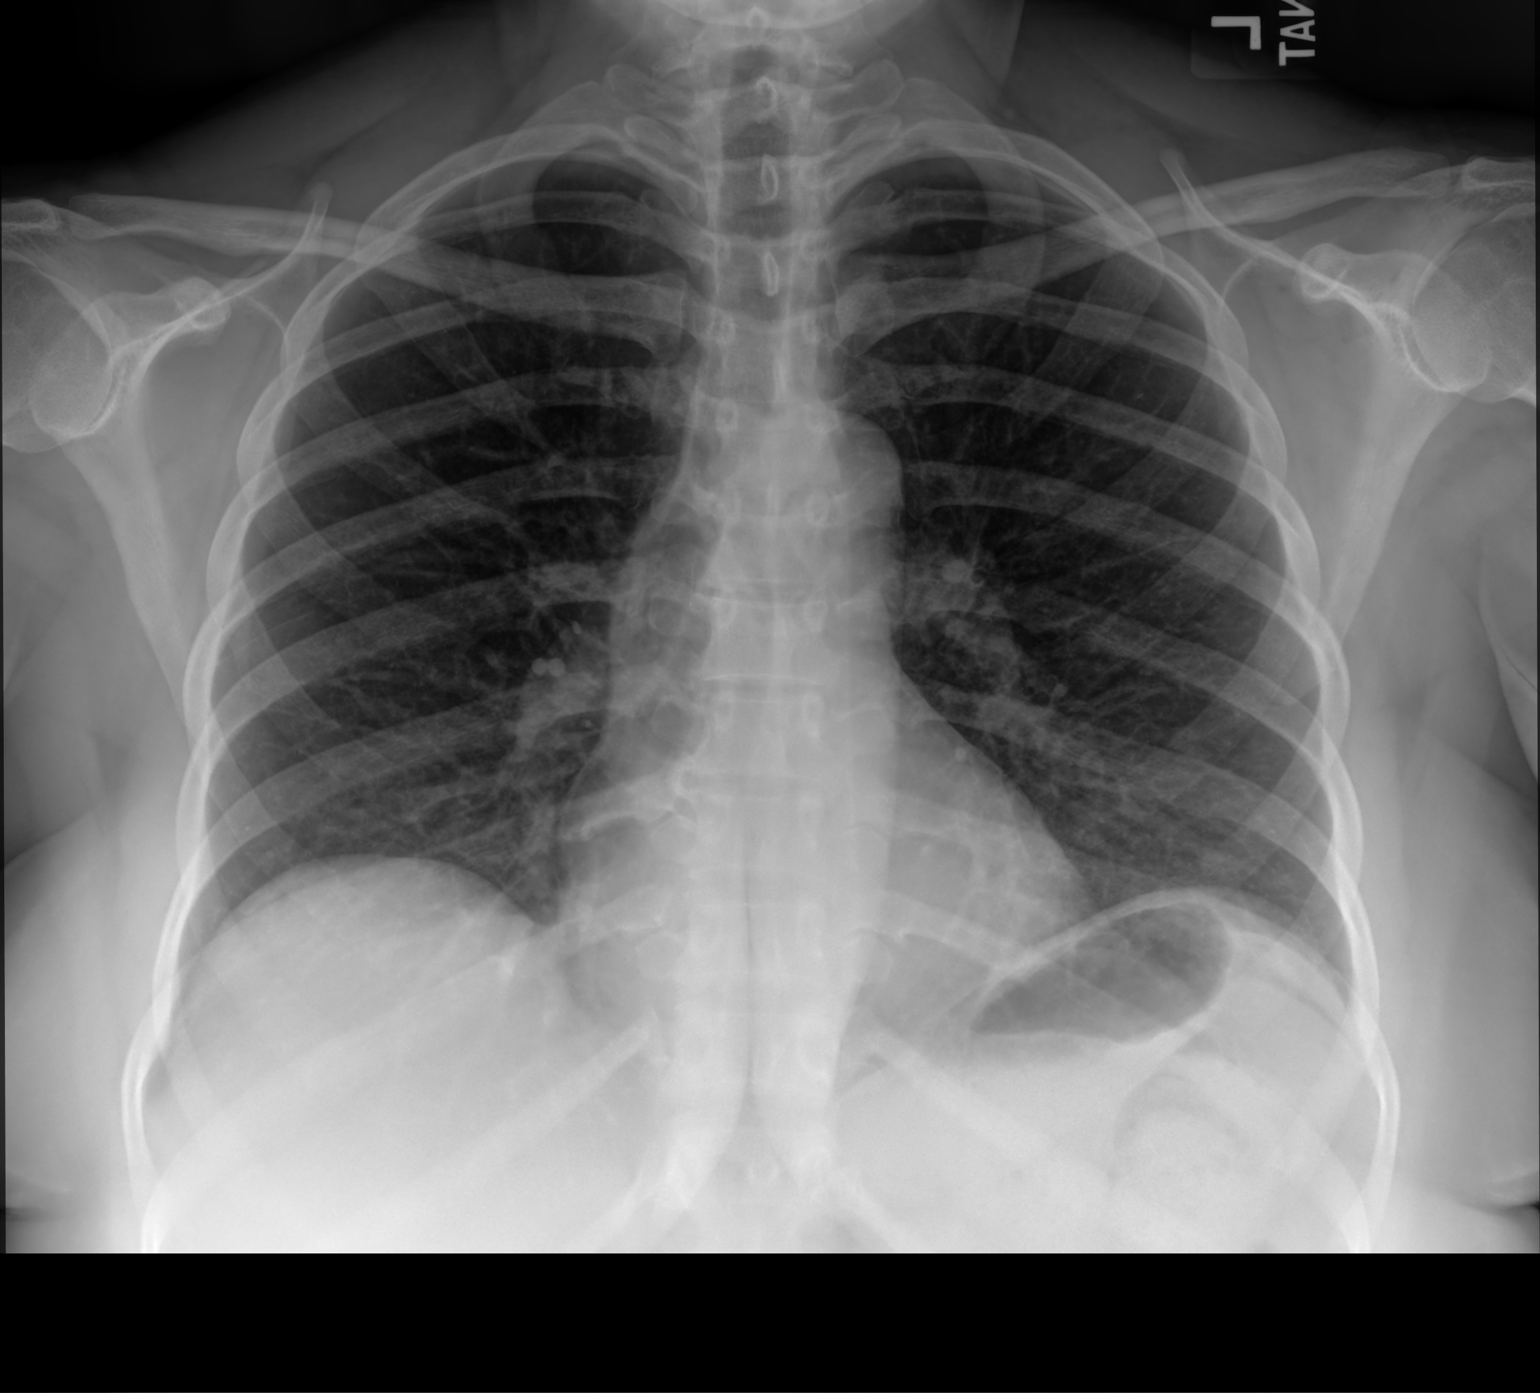

[chest lat]
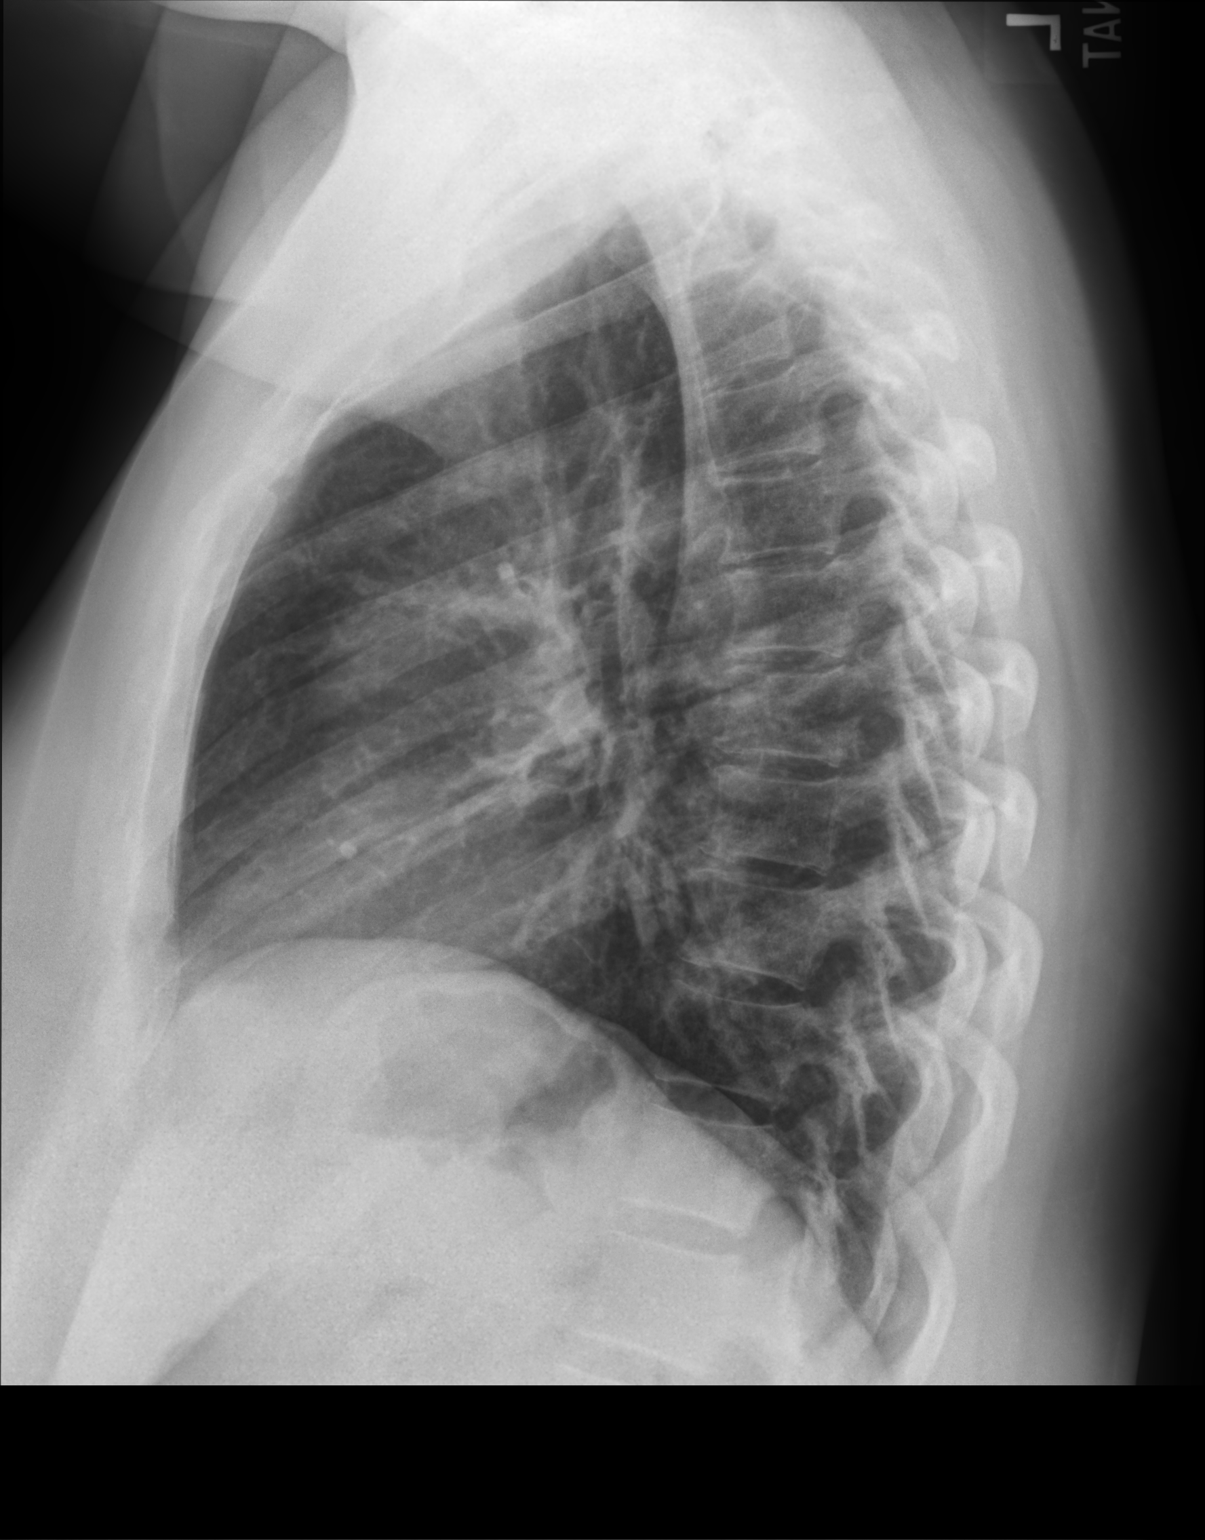

[2 of 2 positions shown; findings below may reference images not displayed]

FINDINGS: The heart and mediastinal contours are within normal limits.

No focal consolidation. No pulmonary edema. No pleural effusion. No
pneumothorax.

No acute osseous abnormality.
IMPRESSION: No active cardiopulmonary disease.
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
## 2022-04-21 ENCOUNTER — Ambulatory Visit (INDEPENDENT_AMBULATORY_CARE_PROVIDER_SITE_OTHER): Payer: 59 | Admitting: Internal Medicine

## 2022-04-21 ENCOUNTER — Telehealth: Payer: Self-pay | Admitting: Pharmacist

## 2022-04-21 ENCOUNTER — Encounter: Payer: Self-pay | Admitting: Internal Medicine

## 2022-04-21 ENCOUNTER — Ambulatory Visit (INDEPENDENT_AMBULATORY_CARE_PROVIDER_SITE_OTHER): Payer: Commercial Managed Care - HMO | Admitting: Internal Medicine

## 2022-04-21 VITALS — BP 120/70 | HR 84 | Ht 61.0 in | Wt 207.0 lb

## 2022-04-21 DIAGNOSIS — J4489 Other specified chronic obstructive pulmonary disease: Secondary | ICD-10-CM | POA: Diagnosis not present

## 2022-04-21 DIAGNOSIS — J3089 Other allergic rhinitis: Secondary | ICD-10-CM

## 2022-04-21 DIAGNOSIS — J302 Other seasonal allergic rhinitis: Secondary | ICD-10-CM

## 2022-04-21 LAB — PULMONARY FUNCTION TEST
DL/VA % pred: 113 %
DL/VA: 4.94 ml/min/mmHg/L
DLCO cor % pred: 93 %
DLCO cor: 17.54 ml/min/mmHg
DLCO unc % pred: 93 %
DLCO unc: 17.54 ml/min/mmHg
FEF 25-75 Post: 0.38 L/sec
FEF 25-75 Pre: 0.39 L/sec
FEF2575-%Change-Post: -3 %
FEF2575-%Pred-Post: 15 %
FEF2575-%Pred-Pre: 15 %
FEV1-%Change-Post: -1 %
FEV1-%Pred-Post: 31 %
FEV1-%Pred-Pre: 32 %
FEV1-Post: 0.77 L
FEV1-Pre: 0.78 L
FEV1FVC-%Change-Post: -1 %
FEV1FVC-%Pred-Pre: 53 %
FEV6-%Change-Post: 0 %
FEV6-%Pred-Post: 60 %
FEV6-%Pred-Pre: 61 %
FEV6-Post: 1.83 L
FEV6-Pre: 1.84 L
FEV6FVC-%Change-Post: 0 %
FEV6FVC-%Pred-Post: 102 %
FEV6FVC-%Pred-Pre: 103 %
FVC-%Change-Post: 0 %
FVC-%Pred-Post: 59 %
FVC-%Pred-Pre: 59 %
FVC-Post: 1.85 L
FVC-Pre: 1.85 L
Post FEV1/FVC ratio: 42 %
Post FEV6/FVC ratio: 100 %
Pre FEV1/FVC ratio: 42 %
Pre FEV6/FVC Ratio: 100 %
RV % pred: 310 %
RV: 5.34 L
TLC % pred: 170 %
TLC: 7.84 L

## 2022-04-21 MED ORDER — TRELEGY ELLIPTA 200-62.5-25 MCG/ACT IN AEPB: 1.0000 | INHALATION_SPRAY | Freq: Every day | RESPIRATORY_TRACT | 0 refills | Status: DC

## 2022-04-21 MED ORDER — PREDNISONE 10 MG PO TABS: ORAL_TABLET | ORAL | 0 refills | Status: DC

## 2022-04-21 NOTE — Progress Notes (Signed)
Full PFT performed today. °

## 2022-04-21 NOTE — Patient Instructions (Signed)
Script sent for prednisone taper  Order- Dupixent application  Order- sample x 2 Trelegy 200- be sure to rinse mouth after using this.

## 2022-04-21 NOTE — Assessment & Plan Note (Signed)
Symptoms are nonspecific currently allergic versus viral, but she is not describing itching. Plan-Nasonex and antihistamines as needed for upper airway.

## 2022-04-21 NOTE — Assessment & Plan Note (Signed)
Acute symptoms over the last 3 weeks.  Hopefully this explains a significant part of the decline in scores since 2015, in which case we could be looking for improvement. Plan-application for Dupixent, prednisone 8-day taper, samples of Trelegy 200, after which she will drop back to her Trelegy 100.

## 2022-04-21 NOTE — Telephone Encounter (Signed)
Received notification from [place], [place], that patient is to start [place]. We had previously tried to start patient but she had loss of insurance coverage and she did not previously return call to pharmacy team.  She appears to have a new insurance plan from the last time the pharmacy team ran benefits.  Will await OV note from today to be signed  Per Arby Barrette, patient is completing DMW enrollment form today and will be placed in pharmacy mailbox  Knox Saliva, PharmD, MPH, BCPS, CPP Clinical Pharmacist (Rheumatology and Pulmonology)

## 2022-04-21 NOTE — Patient Instructions (Signed)
Full PFT performed today. °

## 2022-04-24 NOTE — Telephone Encounter (Signed)
PAP application received, placed in "PAP Pending" folder.

## 2022-04-24 NOTE — Telephone Encounter (Signed)
Submitted a Prior Authorization request to [employer]) for [place] via CoverMyMeds. Will update once we receive a response.  Key: Virgel Paling, PharmD, MPH, BCPS, CPP Clinical Pharmacist (Rheumatology and Pulmonology)

## 2022-04-26 ENCOUNTER — Other Ambulatory Visit (HOSPITAL_COMMUNITY): Payer: Self-pay

## 2022-04-26 NOTE — Telephone Encounter (Signed)
Received notification from [place] regarding a prior authorization for [place]. Authorization has been APPROVED from 04/24/22 to 04/24/2023.  Per test claim, copay for 28 days supply is $5296.11 (patient appears to have high deductible plan. Max OOP for individuals is [phone])  Patient can fill through [place]: [phone]   Authorization # [id number]  Activated [place] copay card that is in office. Unable to process test claim with copay card. Will try again later ID: [id number] BIN: [id number] PCN: [id number] Group: [id number]  Knox Saliva, PharmD, MPH, BCPS, CPP Clinical Pharmacist (Rheumatology and Pulmonology)

## 2022-05-09 ENCOUNTER — Other Ambulatory Visit (HOSPITAL_COMMUNITY): Payer: Self-pay

## 2022-05-09 ENCOUNTER — Telehealth: Payer: Self-pay | Admitting: Internal Medicine

## 2022-05-09 DIAGNOSIS — R0602 Shortness of breath: Secondary | ICD-10-CM

## 2022-05-09 MED ORDER — BREZTRI AEROSPHERE 160-9-4.8 MCG/ACT IN AERO: 2.0000 | INHALATION_SPRAY | Freq: Two times a day (BID) | RESPIRATORY_TRACT | 0 refills | Status: DC

## 2022-05-09 NOTE — Telephone Encounter (Signed)
Called and spoke with pt who states she is not any better after last OV with CY. States that the prednisone that was prescribed did not help with her symptoms.  Pt has complaints still including cough with clear phlegm, wheezing, and increased SOB.  Pt has been taking mucinex, using her Trelegy inhaler as prescribed, and states that she does one neb treatment a day. Pt has not used her rescue inhaler.  Pt denies any complaints of fever.  Due to still not being better after last OV, pt wants to know what could be recommended. Dr. Annamaria Boots, please advise.   Allergies  Allergen Reactions   Benadryl [Diphenhydramine Hcl] Hives and Shortness Of Breath   Z-Pak [Azithromycin] Hives     Current Outpatient Medications:    albuterol (PROAIR HFA) 108 (90 BASE) MCG/ACT inhaler, Inhale 2 puffs into the lungs every 6 (six) hours as needed for wheezing or shortness of breath., Disp: 3 Inhaler, Rfl: 1   benzonatate (TESSALON) 100 MG capsule, Take 1 capsule (100 mg total) by mouth every 8 (eight) hours as needed for cough., Disp: 40 capsule, Rfl: 0   doxycycline (VIBRA-TABS) 100 MG tablet, Take 1 tablet (100 mg total) by mouth 2 (two) times daily., Disp: 14 tablet, Rfl: 0   famotidine (PEPCID) 20 MG tablet, Take 1 tablet by mouth as needed., Disp: , Rfl:    fluticasone (FLONASE) 50 MCG/ACT nasal spray, Place 2 sprays into both nostrils daily., Disp: 16 g, Rfl: 2   Fluticasone-Umeclidin-Vilant (TRELEGY ELLIPTA) 200-62.5-25 MCG/ACT AEPB, Inhale 1 puff into the lungs daily., Disp: 2 each, Rfl: 0   guaiFENesin (MUCINEX) 600 MG 12 hr tablet, Take 1 tablet (600 mg total) by mouth 2 (two) times daily., Disp: , Rfl:    ipratropium-albuterol (DUONEB) 0.5-2.5 (3) MG/3ML SOLN, Inhale 1 neb every 6 hours as needed, Disp: 75 mL, Rfl: 12   montelukast (SINGULAIR) 10 MG tablet, TAKE 1 TABLET BY MOUTH EVERYDAY AT BEDTIME, Disp: 90 tablet, Rfl: 1   Multiple Vitamin (MULTIVITAMIN) tablet, Take 1 tablet by mouth daily., Disp:  , Rfl:    predniSONE (DELTASONE) 10 MG tablet, 4 X 2 DAYS, 3 X 2 DAYS, 2 X 2 DAYS, 1 X 2 DAYS, Disp: 20 tablet, Rfl: 0   TRELEGY ELLIPTA 100-62.5-25 MCG/INH AEPB, TAKE 1 PUFF BY MOUTH EVERY DAY, Disp: 180 each, Rfl: 1

## 2022-05-09 NOTE — Telephone Encounter (Signed)
1) She has been approved to start Dupixent Biologic injections. Please check with our pharmacy team to see what needs to get done for this.  2) Suggest she use her nebulizer machine 2 or 3 times daily for now.  3) Suggest we provide her 2 samples of Breztri to try instead of Trelegy, to see if it works better for her.    Inhale 2 puffs then rinse mouth, twice daily. When she runs out of [place], go back to Trelegy for comparison.

## 2022-05-09 NOTE — Telephone Encounter (Signed)
Called and spoke with pt letting her know recs per CY. Let her know that I was putting Breztri sample inhaler up front at the office for her to try and she verbalized understanding.   Routing to pharmacy team to check on when pt might be able to begin [place].

## 2022-05-12 ENCOUNTER — Other Ambulatory Visit: Payer: Self-pay | Admitting: Internal Medicine

## 2022-05-15 MED ORDER — PREDNISONE 10 MG PO TABS: ORAL_TABLET | ORAL | 0 refills | Status: DC

## 2022-05-15 NOTE — Telephone Encounter (Signed)
Spoke to pt and let her know I sent in prednisone for 10 days to her pharmacy. Pt verbalized understanding. Nothing further needed.

## 2022-05-15 NOTE — Telephone Encounter (Signed)
Spoke to pt and she having SOB, coughing up yellowish mucus has not picked up Breztri lefted for her at our front desk on 05/09/2022 but taking her other pulm. meds. Pt asked for prednisone to be send to pharmacy. Pt not able to make it to an ov at this time. Informed pt I will send a message to Dr Annamaria Boots. Pt verbalized understanding.

## 2022-05-15 NOTE — Telephone Encounter (Signed)
Prednisone 10 mg, # 32 6 tabs x 2 days, 4 tabs x 2 days, 3 tabs x 2 days, 2 tabs x 2 days, 1 tab x 2 days

## 2022-05-16 ENCOUNTER — Other Ambulatory Visit (HOSPITAL_COMMUNITY): Payer: Self-pay

## 2022-05-16 NOTE — Telephone Encounter (Signed)
Patient scheduled for Dupixent new start on 05/22/2022. Will use sample since [place] is OOO and to prevent any last-minute scheduling of shipment  Knox Saliva, PharmD, MPH, BCPS, CPP Clinical Pharmacist (Rheumatology and Pulmonology)

## 2022-05-22 ENCOUNTER — Ambulatory Visit: Payer: Commercial Managed Care - HMO | Admitting: Pharmacist

## 2022-05-22 ENCOUNTER — Other Ambulatory Visit (HOSPITAL_COMMUNITY): Payer: Self-pay

## 2022-05-22 DIAGNOSIS — Z7189 Other specified counseling: Secondary | ICD-10-CM

## 2022-05-22 DIAGNOSIS — J4489 Other specified chronic obstructive pulmonary disease: Secondary | ICD-10-CM

## 2022-05-22 DIAGNOSIS — J302 Other seasonal allergic rhinitis: Secondary | ICD-10-CM

## 2022-05-22 MED ORDER — DUPIXENT 300 MG/2ML ~~LOC~~ SOAJ
300.0000 mg | SUBCUTANEOUS | 5 refills | Status: DC
  Filled 2022-05-22 – 2022-05-23 (×2): qty 4, 28d supply, fill #0
  Filled 2022-06-22 – 2022-06-27 (×3): qty 4, 28d supply, fill #1
  Filled 2022-07-19: qty 4, 28d supply, fill #2
  Filled 2022-08-17: qty 4, 28d supply, fill #3
  Filled 2022-09-15: qty 4, 28d supply, fill #4
  Filled 2022-10-12: qty 4, 28d supply, fill #5

## 2022-05-22 MED ORDER — MONTELUKAST SODIUM 10 MG PO TABS: ORAL_TABLET | ORAL | 1 refills | Status: DC

## 2022-05-22 NOTE — Progress Notes (Signed)
HPI Patient presents today to [facility] to see pharmacy team for [place] new start.  Past medical history includes asthma-COPD overlap, eczema, allergic rhinitis.  She states the past asthma flare she had that started about 3 months ago has been the worst that she has had in the past few years. Fluctuations in weather precipitate asthma flares. She is still completing prednisone taper.  She is nervous to start an injectable medication  Respiratory Medications Current regimen: Trelegy 100-62.5-25 (1 puffs once daily), montelukast 10 mg nightly (ran out of refills) Tried in past: Breztri Patient reports no known adherence challenges  OBJECTIVE Allergies  Allergen Reactions   Benadryl [Diphenhydramine Hcl] Hives and Shortness Of Breath   Z-Pak [Azithromycin] Hives    Outpatient Encounter Medications as of 05/22/2022  Medication Sig   TRELEGY ELLIPTA 100-62.5-25 MCG/INH AEPB TAKE 1 PUFF BY MOUTH EVERY DAY   albuterol (PROAIR HFA) 108 (90 BASE) MCG/ACT inhaler Inhale 2 puffs into the lungs every 6 (six) hours as needed for wheezing or shortness of breath.   benzonatate (TESSALON) 100 MG capsule TAKE 1 CAPSULE BY MOUTH EVERY 8 HOURS AS NEEDED FOR COUGH.   fluticasone (FLONASE) 50 MCG/ACT nasal spray Place 2 sprays into both nostrils daily.   Fluticasone-Umeclidin-Vilant (TRELEGY ELLIPTA) 200-62.5-25 MCG/ACT AEPB Inhale 1 puff into the lungs daily.   guaiFENesin (MUCINEX) 600 MG 12 hr tablet Take 1 tablet (600 mg total) by mouth 2 (two) times daily.   ipratropium-albuterol (DUONEB) 0.5-2.5 (3) MG/3ML SOLN Inhale 1 neb every 6 hours as needed   montelukast (SINGULAIR) 10 MG tablet TAKE 1 TABLET BY MOUTH EVERYDAY AT BEDTIME   Multiple Vitamin (MULTIVITAMIN) tablet Take 1 tablet by mouth daily.   predniSONE (DELTASONE) 10 MG tablet Take 6 tablets (60 mg total) by mouth daily with breakfast for 2 days, THEN 4 tablets (40 mg total) daily with breakfast for 2 days, THEN 3 tablets (30 mg  total) daily with breakfast for 2 days, THEN 2 tablets (20 mg total) daily with breakfast for 2 days, THEN 1 tablet (10 mg total) daily with breakfast for 2 days.   [DISCONTINUED] Budeson-Glycopyrrol-Formoterol (BREZTRI AEROSPHERE) 160-9-4.8 MCG/ACT AERO Inhale 2 puffs into the lungs in the morning and at bedtime.   [DISCONTINUED] doxycycline (VIBRA-TABS) 100 MG tablet Take 1 tablet (100 mg total) by mouth 2 (two) times daily.   [DISCONTINUED] famotidine (PEPCID) 20 MG tablet Take 1 tablet by mouth as needed.   [DISCONTINUED] montelukast (SINGULAIR) 10 MG tablet TAKE 1 TABLET BY MOUTH EVERYDAY AT BEDTIME   [DISCONTINUED] predniSONE (DELTASONE) 10 MG tablet 4 X 2 DAYS, 3 X 2 DAYS, 2 X 2 DAYS, 1 X 2 DAYS   No facility-administered encounter medications on file as of 05/22/2022.     Immunization History  Administered Date(s) Administered   Influenza Split 05/23/2012, 02/01/2015   Influenza,inj,Quad PF,6+ Mos 06/06/2013, 02/23/2014, 02/24/2016, 02/15/2017, 02/15/2018, 02/12/2019   Influenza-Unspecified 01/07/2015   PFIZER(Purple Top)SARS-COV-2 Vaccination 09/24/2019, 10/17/2019, 05/10/2020   Pneumococcal Polysaccharide-23 06/06/2013, 06/24/2016   Tdap 02/23/2014     PFTs    Latest Ref Rng & Units 04/21/2022    9:10 AM 03/10/2014    9:02 AM  PFT Results  FVC-Pre L 1.85  2.29   FVC-Predicted Pre % 59  87   FVC-Post L 1.85  2.22   FVC-Predicted Post % 59  84   Pre FEV1/FVC % % 42  44   Post FEV1/FCV % % 42  43   FEV1-Pre L 0.78  1.01  FEV1-Predicted Pre % 32  47   FEV1-Post L 0.77  0.95   DLCO uncorrected ml/min/mmHg 17.54  22.15   DLCO UNC% % 93  109   DLCO corrected ml/min/mmHg 17.54    DLCO COR %Predicted % 93    DLVA Predicted % 113  134   TLC L 7.84  4.76   TLC % Predicted % 170  103   RV % Predicted % 310  138      Eosinophils Most recent blood eosinophil count was 100 cells/microL taken on 10/18/2021. Previously as high as 300 on 06/23/2016  IgE: 231 on  10/18/2021   Assessment   Biologics training for dupilumab (Dupixent)  Goals of therapy: Mechanism: human monoclonal IgG4 antibody that inhibits interleukin-4 and interleukin-13 cytokine-induced responses, including release of proinflammatory cytokines, chemokines, and IgE Reviewed that Dupixent is add-on medication and patient must continue maintenance inhaler regimen. Response to therapy: may take 4 months to determine efficacy. Discussed that patients generally feel improvement sooner than 4 months.  Side effects: injection site reaction (6-18%), antibody development (5-16%), ophthalmic conjunctivitis (2-16%), transient blood eosinophilia (1-2%)  Dose: '600mg'$  at Week 0 (administered today in clinic) followed by '300mg'$  every 14 days thereafter  Administration/Storage:  Reviewed administration sites of thigh or abdomen (at least 2-3 inches away from abdomen). Reviewed the upper arm is only appropriate if caregiver is administering injection  Do not shake pen/syringe as this could lead to product foaming or precipitation. Do not use if solution is discolored or contains particulate matter or if window on prefilled pen is yellow (indicates pen has been used).  Reviewed storage of medication in refrigerator. Reviewed that [place] can be stored at room temperature in unopened carton for up to 14 days.  Access: Approval of Dupixent through: insurance Patient enrolled into copay card program to help with copay assistance.  Patient self-administered Dupixent '300mg'$ /57m x 2 (total dose '600mg'$ ) in right upper thigh and left upper thigh using sample Dupixent '300mg'$ /221mautoinjector pen NDC: [id number]: [id number] Expiration: 03/07/2024  Patient monitored for 30 minutes for adverse reaction.  Patient tolerated well. She reported pain with injection but believes she will be able to complete injections at home with assistance of her husband. Injection site checked and no redness or swelling noted.  Patient denies itchiness and irritation.  Medication Reconciliation  A drug regimen assessment was performed, including review of allergies, interactions, disease-state management, dosing and immunization history. Medications were reviewed with the patient, including name, instructions, indication, goals of therapy, potential side effects, importance of adherence, and safe use.  Drug interaction(s): none noted  Refill for montelukast '10mg'$  nightly sent today  PLAN Continue Dupixent '300mg'$  every 14 days.  Next dose is due 06/05/2022 and every 14 days thereafter. Rx [facility]: [phone]  Patient provided with pharmacy phone number and advised to call later this week to schedule shipment to home. Patient provided with copay card information to provide to pharmacy if quoted copay exceeds $5 per month. Continue maintenance asthma regimen of: Trelegy 100-62.5-25 (1 puffs once daily). Restart montelukast 10 mg nightly  All questions encouraged and answered.  Instructed patient to reach out with any further questions or concerns.  Thank you for allowing pharmacy to participate in this patient's care.  This appointment required 45 minutes of patient care (this includes precharting, chart review, review of results, face-to-face care, etc.).  DeKnox SalivaPharmD, MPH, BCPS, CPP Clinical Pharmacist (Rheumatology and Pulmonology)

## 2022-05-22 NOTE — Patient Instructions (Addendum)
Your next Gerty dose is due on 06/05/22, 06/19/22, and every 14 days thereafter  CONTINUE Trelegy 100-62.5-25 mcg (1 puff once daily) RESTART montelukast '10mg'$  nightly. [facility]  Your prescription will be shipped from [place]. Their phone number is [phone] Arvilla Market or Ralph Leyden (patient advocates) will call you to schedule the first shipment to your home. After that, the staff at the pharmacy will call you directly to schedule shipment. They will mail your medication to your home.  Follow-up with Dr. Annamaria Boots in 2-3 months as scheduled. We can see how the [place] is working for you  How to manage an injection site reaction: Remember the 5 C's: COUNTER - leave on the counter at least 30 minutes but up to overnight to bring medication to room temperature. This may help prevent stinging COLD - place something cold (like an ice gel pack or cold water bottle) on the injection site just before cleansing with alcohol. This may help reduce pain CLARITIN - use Claritin (generic name is loratadine) for the first two weeks of treatment or the day of, the day before, and the day after injecting. This will help to minimize injection site reactions CORTISONE CREAM - apply if injection site is irritated and itching CALL ME - if injection site reaction is bigger than the size of your fist, looks infected, blisters, or if you develop hives

## 2022-05-23 ENCOUNTER — Other Ambulatory Visit: Payer: Self-pay

## 2022-05-23 ENCOUNTER — Other Ambulatory Visit (HOSPITAL_COMMUNITY): Payer: Self-pay

## 2022-05-24 ENCOUNTER — Encounter (HOSPITAL_COMMUNITY): Payer: Self-pay

## 2022-05-24 ENCOUNTER — Emergency Department (HOSPITAL_COMMUNITY)
Admission: EM | Admit: 2022-05-24 | Discharge: 2022-05-24 | Disposition: A | Discharge status: Home / Self Care | Payer: Commercial Managed Care - HMO | Attending: Emergency Medicine | Admitting: Emergency Medicine

## 2022-05-24 ENCOUNTER — Other Ambulatory Visit (HOSPITAL_COMMUNITY): Payer: Self-pay

## 2022-05-24 ENCOUNTER — Emergency Department (HOSPITAL_COMMUNITY): Payer: Commercial Managed Care - HMO

## 2022-05-24 DIAGNOSIS — Z7951 Long term (current) use of inhaled steroids: Secondary | ICD-10-CM | POA: Diagnosis not present

## 2022-05-24 DIAGNOSIS — J449 Chronic obstructive pulmonary disease, unspecified: Secondary | ICD-10-CM | POA: Insufficient documentation

## 2022-05-24 DIAGNOSIS — R059 Cough, unspecified: Secondary | ICD-10-CM | POA: Diagnosis not present

## 2022-05-24 DIAGNOSIS — D72829 Elevated white blood cell count, unspecified: Secondary | ICD-10-CM | POA: Insufficient documentation

## 2022-05-24 DIAGNOSIS — J45909 Unspecified asthma, uncomplicated: Secondary | ICD-10-CM | POA: Diagnosis not present

## 2022-05-24 DIAGNOSIS — R0602 Shortness of breath: Secondary | ICD-10-CM | POA: Diagnosis present

## 2022-05-24 DIAGNOSIS — Z20822 Contact with and (suspected) exposure to covid-19: Secondary | ICD-10-CM | POA: Insufficient documentation

## 2022-05-24 LAB — BASIC METABOLIC PANEL
Anion gap: 10 (ref 5–15)
BUN: 12 mg/dL (ref 6–20)
CO2: 26 mmol/L (ref 22–32)
Calcium: 8.9 mg/dL (ref 8.9–10.3)
Chloride: 102 mmol/L (ref 98–111)
Creatinine, Ser: 0.6 mg/dL (ref 0.44–1.00)
GFR, Estimated: >60 mL/min (ref >60)
Glucose, Bld: 132 mg/dL — ABNORMAL HIGH (ref 70–99)
Potassium: 3.3 mmol/L — ABNORMAL LOW (ref 3.5–5.1)
Sodium: 138 mmol/L (ref 135–145)

## 2022-05-24 LAB — CBC
HCT: 41.2 % (ref 36.0–46.0)
Hemoglobin: 12.2 g/dL (ref 12.0–15.0)
MCH: 26.3 pg (ref 26.0–34.0)
MCHC: 29.6 g/dL — ABNORMAL LOW (ref 30.0–36.0)
MCV: 89 fL (ref 80.0–100.0)
Platelets: 196 10*3/uL (ref 150–400)
RBC: 4.63 MIL/uL (ref 3.87–5.11)
RDW: 13.3 % (ref 11.5–15.5)
WBC: 13.1 10*3/uL — ABNORMAL HIGH (ref 4.0–10.5)
nRBC: 0 % (ref 0.0–0.2)

## 2022-05-24 LAB — RESP PANEL BY RT-PCR (RSV, FLU A&B, COVID)  RVPGX2
Influenza A by PCR: NEGATIVE
Influenza B by PCR: NEGATIVE
Resp Syncytial Virus by PCR: NEGATIVE
SARS Coronavirus 2 by RT PCR: NEGATIVE

## 2022-05-24 MED ORDER — PREDNISONE 20 MG PO TABS: 40.0000 mg | ORAL_TABLET | Freq: Every day | ORAL | 0 refills | Status: AC

## 2022-05-24 MED ORDER — SODIUM CHLORIDE 0.9 % IV BOLUS (SEPSIS)
1000.0000 mL | Freq: Once | INTRAVENOUS | Status: AC
  Administered 2022-05-24: 1000 mL via INTRAVENOUS

## 2022-05-24 MED ORDER — ALBUTEROL SULFATE (2.5 MG/3ML) 0.083% IN NEBU
2.5000 mg | INHALATION_SOLUTION | Freq: Once | RESPIRATORY_TRACT | Status: AC
  Administered 2022-05-24: 2.5 mg via RESPIRATORY_TRACT

## 2022-05-24 MED ORDER — IPRATROPIUM-ALBUTEROL 0.5-2.5 (3) MG/3ML IN SOLN
3.0000 mL | Freq: Once | RESPIRATORY_TRACT | Status: AC
  Administered 2022-05-24: 3 mL via RESPIRATORY_TRACT

## 2022-05-24 MED ORDER — IPRATROPIUM BROMIDE 0.02 % IN SOLN
0.5000 mg | Freq: Once | RESPIRATORY_TRACT | Status: AC
  Administered 2022-05-24: 0.5 mg via RESPIRATORY_TRACT

## 2022-05-24 MED ORDER — ALBUTEROL SULFATE (2.5 MG/3ML) 0.083% IN NEBU
10.0000 mg | INHALATION_SOLUTION | RESPIRATORY_TRACT | Status: DC
  Administered 2022-05-24: 10 mg via RESPIRATORY_TRACT

## 2022-05-24 NOTE — ED Triage Notes (Signed)
Pt comes via [facility] for SOB, hx of COPD, PTA received 2gm mag and 2 duonebs, 125 solumedrol

## 2022-05-24 NOTE — ED Notes (Signed)
Patient Alert and oriented to baseline. Stable and ambulatory to baseline. Patient verbalized understanding of the discharge instructions.  Patient belongings were taken by the patient.   

## 2022-05-24 NOTE — ED Notes (Signed)
Attempted to ambulate pt, and sats remained >96% on RA, but pt became very exerted and wheezing increased at this time. Dr. Christy Gentles notified

## 2022-05-24 NOTE — ED Provider Notes (Signed)
[facility] EMERGENCY DEPARTMENT Provider Note   CSN: [id number] Arrival date & time:        History  Chief Complaint  Patient presents with   Shortness of Breath    Jocelyn Myers is a 57 y.o. female.  The history is provided by the patient and the EMS personnel.  Patient with history of asthma presents shortness of breath.  She reports increasing cough, wheezing and shortness of breath.  She reports chest tightness.  She reports recent flulike illness.  She is a non-smoker.  She is not on home oxygen.  She has never been intubated.  She has been on Dupixent injections    Past Medical History:  Diagnosis Date   Asthma    COPD (chronic obstructive pulmonary disease) ([place])     Home Medications Prior to Admission medications   Medication Sig Start Date End Date Taking? Authorizing Provider  predniSONE (DELTASONE) 20 MG tablet Take 2 tablets (40 mg total) by mouth daily with breakfast for 5 days. For the next four days 05/24/22 05/29/22 Yes Carmin Muskrat, MD  albuterol [facility] HFA) 108 (90 BASE) MCG/ACT inhaler Inhale 2 puffs into the lungs every 6 (six) hours as needed for wheezing or shortness of breath. 04/29/13   Noralee Space, MD  benzonatate (TESSALON) 100 MG capsule TAKE 1 CAPSULE BY MOUTH EVERY 8 HOURS AS NEEDED FOR COUGH. 05/15/22   Young, Clinton D, MD  Dupilumab (DUPIXENT) 300 MG/2ML SOPN Inject 300 mg into the skin every 14 (fourteen) days. 05/22/22   Deneise Lever, MD  fluticasone (FLONASE) 50 MCG/ACT nasal spray Place 2 sprays into both nostrils daily. 06/26/16   Erick Colace, NP  Fluticasone-Umeclidin-Vilant (TRELEGY ELLIPTA) 200-62.5-25 MCG/ACT AEPB Inhale 1 puff into the lungs daily. 04/21/22   Deneise Lever, MD  guaiFENesin (MUCINEX) 600 MG 12 hr tablet Take 1 tablet (600 mg total) by mouth 2 (two) times daily. 06/26/16   Erick Colace, NP  ipratropium-albuterol (DUONEB) 0.5-2.5 (3) MG/3ML SOLN Inhale 1 neb every 6 hours as needed  03/21/21   Baird Lyons D, MD  montelukast (SINGULAIR) 10 MG tablet TAKE 1 TABLET BY MOUTH EVERYDAY AT BEDTIME 05/22/22   Baird Lyons D, MD  Multiple Vitamin (MULTIVITAMIN) tablet Take 1 tablet by mouth daily.    [provider]  Donnal Debar 100-62.5-25 MCG/INH AEPB TAKE 1 PUFF BY MOUTH EVERY DAY 09/16/18   Deneise Lever, MD      Allergies    Benadryl [diphenhydramine hcl] and Z-pak [azithromycin]    Review of Systems   Review of Systems  Respiratory:  Positive for cough, shortness of breath and wheezing.     Physical Exam Updated Vital Signs BP 112/66   Pulse 87   Temp 97.6 F (36.4 C)   Resp 12   LMP 02/22/2011   SpO2 98%  Physical Exam CONSTITUTIONAL: Well developed/well nourished, mild distress noted HEAD: Normocephalic/atraumatic EYES: EOMI/PERRL ENMT: Mucous membranes moist NECK: supple no meningeal signs SPINE/BACK:entire spine nontender CV: S1/S2 noted, no murmurs/rubs/gallops noted LUNGS: Tachypneic and wheezing bilaterally ABDOMEN: soft NEURO: Pt is awake/alert/appropriate, moves all extremitiesx4.  No facial droop.   EXTREMITIES: pulses normal/equal, full ROM SKIN: warm, color normal PSYCH: Mildly anxious  ED Results / Procedures / Treatments   Labs (all labs ordered are listed, but only abnormal results are displayed) Labs Reviewed  CBC - Abnormal; Notable for the following components:      Result Value   WBC 13.1 (*)  MCHC 29.6 (*)    All other components within normal limits  BASIC METABOLIC PANEL - Abnormal; Notable for the following components:   Potassium 3.3 (*)    Glucose, Bld 132 (*)    All other components within normal limits  RESP PANEL BY RT-PCR (RSV, FLU A&B, COVID)  [id number]    EKG EKG Interpretation  Date/Time:  Wednesday May 24 2022 02:38:30 EST Ventricular Rate:  95 PR Interval:  146 QRS Duration: 86 QT Interval:  373 QTC Calculation: 469 R Axis:   -7 Text Interpretation: Sinus rhythm Low voltage,  precordial leads Interpretation limited secondary to artifact Confirmed by Ripley Fraise [id number] on 05/24/2022 2:45:12 AM  Radiology DG Chest Port 1 View  Result Date: 05/24/2022 CLINICAL DATA:  Shortness of breath, history of COPD EXAM: PORTABLE CHEST 1 VIEW COMPARISON:  03/16/2021 FINDINGS: Cardiac and mediastinal contours are within normal limits. No focal pulmonary opacity. No pleural effusion or pneumothorax. No acute osseous abnormality. IMPRESSION: No acute cardiopulmonary process. Electronically Signed   By: Merilyn Baba M.D.   On: 05/24/2022 02:50    Procedures .Critical Care  Performed by: Ripley Fraise, MD Authorized by: Ripley Fraise, MD   Critical care provider statement:    Critical care time (minutes):  47   Critical care start time:  05/24/2022 2:43 AM   Critical care end time:  05/24/2022 3:30 AM   Critical care time was exclusive of:  Separately billable procedures and treating other patients   Critical care was necessary to treat or prevent imminent or life-threatening deterioration of the following conditions:  Respiratory failure   Critical care was time spent personally by me on the following activities:  Examination of patient, development of treatment plan with patient or surrogate, obtaining history from patient or surrogate, re-evaluation of patient's condition, pulse oximetry, ordering and review of radiographic studies, ordering and review of laboratory studies and ordering and performing treatments and interventions   I assumed direction of critical care for this patient from another provider in my specialty: no       Medications Ordered in ED Medications  albuterol (PROVENTIL) (2.5 MG/3ML) 0.083% nebulizer solution 10 mg (10 mg Nebulization New Bag/Given 05/24/22 0237)  ipratropium (ATROVENT) nebulizer solution 0.5 mg (0.5 mg Nebulization Given 05/24/22 0237)  sodium chloride 0.9 % bolus 1,000 mL (0 mLs Intravenous Stopped 05/24/22 0634)   ipratropium-albuterol (DUONEB) 0.5-2.5 (3) MG/3ML nebulizer solution 3 mL (3 mLs Nebulization Given 05/24/22 0648)  albuterol (PROVENTIL) (2.5 MG/3ML) 0.083% nebulizer solution 2.5 mg (2.5 mg Nebulization Given 05/24/22 2355)    ED Course/ Medical Decision Making/ A&P Clinical Course as of 05/24/22 0745  Wed May 24, 2022  0244 Patient with known history of asthma that is well-controlled as an outpatient presents with increasing cough wheezing and shortness of breath.  Patient has already received nebs, Solu-Medrol and magnesium via EMS.  Will continue neb treatments.  She will likely need admitted for status asthmaticus [DW]  0324 Patient overall improved.  She is more talkative.  She is still getting neb treatments.  She reports she has had similar episodes in the past, but usually can manage them at home.  She just started her Dupixent injections.  Will continue to monitor [DW]  0324 WBC(!): 13.1 Leukocytosis [DW]  0448 Patient feeling improved, wheezing is resolving.  She does report mild dizziness.  Will give her IV fluids [DW]  [phone] Patient has started become increasing shortness of breath with exertion.  Will get another neb treatment. [DW]  Clinical Course User Index [DW] Ripley Fraise, MD                             Medical Decision Making Amount and/or Complexity of Data Reviewed Labs: ordered. Decision-making details documented in ED Course. Radiology: ordered.  Risk Prescription drug management.   This patient presents to the ED for concern of shortness of breath, this involves an extensive number of treatment options, and is a complaint that carries with it a high risk of complications and morbidity.  The differential diagnosis includes but is not limited to Acute coronary syndrome, pneumonia, acute pulmonary edema, pneumothorax, acute anemia, pulmonary embolism    Comorbidities that complicate the patient evaluation: Patient's presentation is complicated by their  history of asthma   Additional history obtained: Additional history obtained from EMS discussed with paramedics at the bedside Records reviewed  pulmonology notes reviewed  Lab Tests: I Ordered, and personally interpreted labs.  The pertinent results include: Leukocytosis  Imaging Studies ordered: I ordered imaging studies including X-ray chest   I independently visualized and interpreted imaging which showed no acute findings I agree with the radiologist interpretation  Cardiac Monitoring: The patient was maintained on a cardiac monitor.  I personally viewed and interpreted the cardiac monitor which showed an underlying rhythm of:  sinus rhythm  Medicines ordered and prescription drug management: I ordered medication including nebulizer therapies for wheezing Reevaluation of the patient after these medicines showed that the patient    improved    Critical Interventions:   nebulized treatments   Reevaluation: After the interventions noted above, I reevaluated the patient and found that they have :improved  Complexity of problems addressed: Patient's presentation is most consistent with  acute presentation with potential threat to life or bodily function   Signed out to dr lockwood at shift change        Final Clinical Impression(s) / ED Diagnoses Final diagnoses:  Shortness of breath    Rx / DC Orders ED Discharge Orders          Ordered    predniSONE (DELTASONE) 20 MG tablet  Daily with breakfast        05/24/22 0734              Ripley Fraise, MD 05/24/22 0745

## 2022-05-24 NOTE — Discharge Instructions (Signed)
Please be sure to follow-up with your pulmonologist.  For the next 2 days please use your albuterol every 4 hours.  You may then use it as needed.  You have been prescribed a new course of steroids.  This is a slightly higher dose than your current steroid taper.  For the next 5 days please use 40 mg daily.  Discuss this with your pulmonologist.

## 2022-05-24 NOTE — ED Provider Notes (Signed)
7:36 AM Care of the patient assumed at signout.  On my evaluation the patient had no wheezing bilaterally after finishing continuous nebulizer treatment.  I walked with her approximately 50 yards, and upon return she was smiling, had no wheezing.  I discussed medications, follow-up, return precautions with her and her husband and she was discharged in stable condition.   Carmin Muskrat, MD 05/24/22 [phone]

## 2022-05-26 ENCOUNTER — Other Ambulatory Visit (HOSPITAL_COMMUNITY): Payer: Self-pay

## 2022-05-29 ENCOUNTER — Other Ambulatory Visit (HOSPITAL_COMMUNITY): Payer: Self-pay

## 2022-06-16 ENCOUNTER — Other Ambulatory Visit: Payer: Self-pay | Admitting: Internal Medicine

## 2022-06-22 ENCOUNTER — Other Ambulatory Visit (HOSPITAL_COMMUNITY): Payer: Self-pay

## 2022-06-26 ENCOUNTER — Other Ambulatory Visit: Payer: Self-pay

## 2022-06-27 ENCOUNTER — Other Ambulatory Visit: Payer: Self-pay

## 2022-06-27 ENCOUNTER — Other Ambulatory Visit (HOSPITAL_COMMUNITY): Payer: Self-pay

## 2022-06-28 ENCOUNTER — Encounter (HOSPITAL_BASED_OUTPATIENT_CLINIC_OR_DEPARTMENT_OTHER): Payer: Self-pay | Admitting: Pediatrics

## 2022-06-28 ENCOUNTER — Emergency Department (HOSPITAL_BASED_OUTPATIENT_CLINIC_OR_DEPARTMENT_OTHER): Payer: Commercial Managed Care - HMO

## 2022-06-28 ENCOUNTER — Other Ambulatory Visit: Payer: Self-pay

## 2022-06-28 ENCOUNTER — Emergency Department (HOSPITAL_BASED_OUTPATIENT_CLINIC_OR_DEPARTMENT_OTHER)
Admission: EM | Admit: 2022-06-28 | Discharge: 2022-06-29 | Disposition: A | Discharge status: Home / Self Care | Payer: Commercial Managed Care - HMO | Attending: Emergency Medicine | Admitting: Emergency Medicine

## 2022-06-28 DIAGNOSIS — J441 Chronic obstructive pulmonary disease with (acute) exacerbation: Secondary | ICD-10-CM | POA: Insufficient documentation

## 2022-06-28 DIAGNOSIS — R0789 Other chest pain: Secondary | ICD-10-CM

## 2022-06-28 DIAGNOSIS — Z7951 Long term (current) use of inhaled steroids: Secondary | ICD-10-CM | POA: Diagnosis not present

## 2022-06-28 DIAGNOSIS — R0602 Shortness of breath: Secondary | ICD-10-CM | POA: Diagnosis present

## 2022-06-28 MED ORDER — ALBUTEROL SULFATE (2.5 MG/3ML) 0.083% IN NEBU
2.5000 mg | INHALATION_SOLUTION | Freq: Once | RESPIRATORY_TRACT | Status: AC
  Administered 2022-06-28: 2.5 mg via RESPIRATORY_TRACT
  Filled 2022-06-28: qty 3

## 2022-06-28 MED ORDER — MAGNESIUM SULFATE 2 GM/50ML IV SOLN
2.0000 g | Freq: Once | INTRAVENOUS | Status: AC
  Administered 2022-06-28: 2 g via INTRAVENOUS
  Filled 2022-06-28: qty 50

## 2022-06-28 MED ORDER — IPRATROPIUM-ALBUTEROL 0.5-2.5 (3) MG/3ML IN SOLN
3.0000 mL | Freq: Once | RESPIRATORY_TRACT | Status: AC
  Administered 2022-06-28: 3 mL via RESPIRATORY_TRACT
  Filled 2022-06-28: qty 3

## 2022-06-28 MED ORDER — PREDNISONE 50 MG PO TABS
60.0000 mg | ORAL_TABLET | Freq: Once | ORAL | Status: AC
  Administered 2022-06-28: 60 mg via ORAL
  Filled 2022-06-28: qty 1

## 2022-06-28 MED ORDER — ALBUTEROL SULFATE HFA 108 (90 BASE) MCG/ACT IN AERS: 2.0000 | INHALATION_SPRAY | Freq: Once | RESPIRATORY_TRACT | Status: DC

## 2022-06-28 NOTE — ED Provider Notes (Signed)
[facility] Provider Note   CSN: [id number] Arrival date & time: 06/28/22  2023     History  Chief Complaint  Patient presents with   Shortness of Breath    Jocelyn Myers is a 57 y.o. female, history of COPD, who presents to the ED secondary to shortness of breath, chest tightness that is been going on for the last couple hours.  She states that she was wheezing earlier today, and just feels uncomfortable.  She states she has had runny nose, sore throat for the last couple months, but today the shortness of breath and chest tightness came on.  She describes the pain as uncomfortable, and a 5 out of 10.  Has not take anything for her pain.  Received a DuoNeb earlier by respiratory, and she states that that helped a little bit.    Home Medications Prior to Admission medications   Medication Sig Start Date End Date Taking? Authorizing Provider  doxycycline (VIBRAMYCIN) 100 MG capsule Take 1 capsule (100 mg total) by mouth 2 (two) times daily. 06/29/22  Yes Geraldo Haris L, PA  predniSONE (DELTASONE) 20 MG tablet Take 2 tablets (40 mg total) by mouth daily. 06/29/22  Yes Gabriellia Rempel L, PA  albuterol (PROAIR HFA) 108 (90 BASE) MCG/ACT inhaler Inhale 2 puffs into the lungs every 6 (six) hours as needed for wheezing or shortness of breath. 04/29/13   Noralee Space, MD  benzonatate (TESSALON) 100 MG capsule TAKE 1 CAPSULE BY MOUTH EVERY 8 HOURS AS NEEDED FOR COUGH 06/16/22   Young, Clinton D, MD  Dupilumab (DUPIXENT) 300 MG/2ML SOPN Inject 300 mg into the skin every 14 (fourteen) days. 05/22/22   Deneise Lever, MD  fluticasone (FLONASE) 50 MCG/ACT nasal spray Place 2 sprays into both nostrils daily. 06/26/16   Erick Colace, NP  Fluticasone-Umeclidin-Vilant (TRELEGY ELLIPTA) 200-62.5-25 MCG/ACT AEPB Inhale 1 puff into the lungs daily. 04/21/22   Deneise Lever, MD  guaiFENesin (MUCINEX) 600 MG 12 hr tablet Take 1 tablet (600 mg total) by mouth 2  (two) times daily. 06/26/16   Erick Colace, NP  ipratropium-albuterol (DUONEB) 0.5-2.5 (3) MG/3ML SOLN Inhale 1 neb every 6 hours as needed 03/21/21   Baird Lyons D, MD  montelukast (SINGULAIR) 10 MG tablet TAKE 1 TABLET BY MOUTH EVERYDAY AT BEDTIME 05/22/22   Baird Lyons D, MD  Multiple Vitamin (MULTIVITAMIN) tablet Take 1 tablet by mouth daily.    [provider]  Donnal Debar 100-62.5-25 MCG/INH AEPB TAKE 1 PUFF BY MOUTH EVERY DAY 09/16/18   Deneise Lever, MD      Allergies    Benadryl [diphenhydramine hcl] and Z-pak [azithromycin]    Review of Systems   Review of Systems  Respiratory:  Positive for cough, chest tightness and shortness of breath.   Gastrointestinal:  Negative for nausea.    Physical Exam Updated Vital Signs BP 97/60   Pulse 73   Temp 98.2 F (36.8 C) (Oral)   Resp 13   LMP 02/22/2011   SpO2 95%  Physical Exam Vitals and nursing note reviewed.  Constitutional:      General: She is not in acute distress.    Appearance: She is well-developed.  HENT:     Head: Normocephalic and atraumatic.  Eyes:     Conjunctiva/sclera: Conjunctivae normal.  Cardiovascular:     Rate and Rhythm: Normal rate and regular rhythm.     Heart sounds: No murmur heard. Pulmonary:  Effort: Pulmonary effort is normal. No respiratory distress.     Breath sounds: Examination of the right-upper field reveals wheezing. Examination of the left-upper field reveals wheezing. Wheezing present.  Abdominal:     Palpations: Abdomen is soft.     Tenderness: There is no abdominal tenderness.  Musculoskeletal:        General: No swelling.     Cervical back: Neck supple.  Skin:    General: Skin is warm and dry.     Capillary Refill: Capillary refill takes less than 2 seconds.  Neurological:     Mental Status: She is alert.  Psychiatric:        Mood and Affect: Mood normal.     ED Results / Procedures / Treatments   Labs (all labs ordered are listed, but only  abnormal results are displayed) Labs Reviewed - No data to display  EKG None  Radiology DG Chest [facility] 1 View  Result Date: 06/28/2022 CLINICAL DATA:  Shortness of breath for 1 day EXAM: PORTABLE CHEST 1 VIEW COMPARISON:  05/24/2022 FINDINGS: The heart size and mediastinal contours are within normal limits. Both lungs are clear. The visualized skeletal structures are unremarkable. IMPRESSION: No active disease. Electronically Signed   By: Inez Catalina M.D.   On: 06/28/2022 21:36    Procedures Procedures   Medications Ordered in ED Medications  magnesium sulfate IVPB 2 g 50 mL (2 g Intravenous New Bag/Given 06/28/22 2335)  ipratropium-albuterol (DUONEB) 0.5-2.5 (3) MG/3ML nebulizer solution 3 mL (3 mLs Nebulization Given 06/28/22 2038)  albuterol (PROVENTIL) (2.5 MG/3ML) 0.083% nebulizer solution 2.5 mg (2.5 mg Nebulization Given 06/28/22 2038)  predniSONE (DELTASONE) tablet 60 mg (60 mg Oral Given 06/28/22 2243)  albuterol (PROVENTIL) (2.5 MG/3ML) 0.083% nebulizer solution 2.5 mg (2.5 mg Nebulization Given 06/28/22 2254)    ED Course/ Medical Decision Making/ A&P                           Medical Decision Making Patient is a 57 year old female, here for shortness of breath, chest pain tightness, that is been going on for the last few hours.  Denies any nausea, vomiting.  States that she has a cough, as well.  Has known COPD, she has diffuse wheezing on exam of bilateral upper lobes, and has no stridor.  She states it kind of burns in her chest,.  We will obtain a chest x-ray initially and EKG, but I am suspicious that this is likely secondary to a COPD exacerbation.  And we will give her an albuterol nebulizer as well as prednisone to help.  Amount and/or Complexity of Data Reviewed Radiology: ordered.    Details: Chest x-ray clear ECG/medicine tests:     Details: Normal sinus rhythm Discussion of management or test interpretation with external provider(s): Discussed with patient,  received magnesium given symptoms, she is feeling much better, and the chest tightness is much improved.  Now she is breathing better, on further evaluation, she has no wheezing left, she is breathing nonlabored respirations.  No audible wheezing.  We discussed blood work further versus further deferring, she is comfortable with discharge home, and close follow-up PCP.  Sent home with doxycycline, prednisone for treatment of a COPD exacerbation.  Risk Prescription drug management.    Final Clinical Impression(s) / ED Diagnoses Final diagnoses:  COPD exacerbation ([place])  Chest tightness    Rx / DC Orders ED Discharge Orders  Ordered    predniSONE (DELTASONE) 20 MG tablet  Daily        06/29/22 0012    doxycycline (VIBRAMYCIN) 100 MG capsule  2 times daily        06/29/22 0012              Londen Lorge, Si Gaul, PA 06/29/22 Florentina Addison, MD 07/04/22 [phone]

## 2022-06-28 NOTE — ED Triage Notes (Signed)
C/O shortness of breath x 1 day; cough x 2 months, productive in nature. Reports hx of COPD.

## 2022-06-28 NOTE — ED Notes (Signed)
Neb tx per RT.

## 2022-06-29 ENCOUNTER — Telehealth: Payer: Self-pay | Admitting: Internal Medicine

## 2022-06-29 MED ORDER — DOXYCYCLINE HYCLATE 100 MG PO CAPS: 100.0000 mg | ORAL_CAPSULE | Freq: Two times a day (BID) | ORAL | 0 refills | Status: DC

## 2022-06-29 MED ORDER — PREDNISONE 20 MG PO TABS: 40.0000 mg | ORAL_TABLET | Freq: Every day | ORAL | 0 refills | Status: DC

## 2022-06-29 MED ORDER — PROMETHAZINE-DM 6.25-15 MG/5ML PO SYRP: 5.0000 mL | ORAL_SOLUTION | Freq: Four times a day (QID) | ORAL | 0 refills | Status: DC | PRN

## 2022-06-29 NOTE — Discharge Instructions (Addendum)
Please follow-up with your primary care doctor, if your cough is persistent and I recommend a CT scan of your chest.  Additionally if you develop worsening chest pain, shortness of breath, or unable to speak in full sentences please return to the ER.

## 2022-06-29 NOTE — Telephone Encounter (Signed)
PT calling needing a recom for a new nebulizer. Her's broke and she can not remember the type. Pls call @ [phone]

## 2022-06-30 ENCOUNTER — Other Ambulatory Visit: Payer: Self-pay

## 2022-06-30 DIAGNOSIS — J4489 Other specified chronic obstructive pulmonary disease: Secondary | ICD-10-CM

## 2022-06-30 MED ORDER — IPRATROPIUM-ALBUTEROL 0.5-2.5 (3) MG/3ML IN SOLN: RESPIRATORY_TRACT | 12 refills | Status: DC

## 2022-06-30 NOTE — Telephone Encounter (Signed)
Order has been placed or new nebulizer machine and medication. Patient verbalized understanding. Nothing further needed.

## 2022-06-30 NOTE — Telephone Encounter (Signed)
Yes thanks. Ok to order DME, for compressor nebulizer for dx severe asthma persistent Since she is not Medicare yet, she will continue to get her DuoNeb nebulizer solution [facility].

## 2022-06-30 NOTE — Telephone Encounter (Signed)
Patient states nebulizer machine has broke. She is not sure who she got her supplies from. Are you fine with a new order being placed and her getting re-established with another DME company.

## 2022-07-12 ENCOUNTER — Telehealth: Payer: Self-pay | Admitting: Internal Medicine

## 2022-07-12 NOTE — Telephone Encounter (Signed)
Need refill for promethazine-dextromethorphan (PROMETHAZINE-DM) 6.25-15 MG/5ML syrup   [facility] [address]

## 2022-07-13 NOTE — Telephone Encounter (Signed)
What we have on her med list is promethazine-dextromethorphan. Is that hat she wants refilled? Or with codeine?

## 2022-07-13 NOTE — Telephone Encounter (Signed)
Dr Annamaria Boots- pt asking for refill on her prometh/codeine   Last rx was prescribed by 06/29/22- #118 ml by PA at the hospital  She states that this is the same cough she had at December 2023 visit with you  She is coughing up clear sputum, with occ yellow  Cough is esp worse at night and she says the only thing that has helped her sleep is this cough syrup  She has a small amount left and ok to wait until tomorrow for call back  Please advise, thanks!  Allergies  Allergen Reactions   Benadryl [Diphenhydramine Hcl] Hives and Shortness Of Breath   Z-Pak [Azithromycin] Hives

## 2022-07-13 NOTE — Telephone Encounter (Signed)
Patient checking on message for cough syrup. Patient out of medication. Pharmacy is [facility] [address]. Patient phone number is [phone].

## 2022-07-14 MED ORDER — PROMETHAZINE-DM 6.25-15 MG/5ML PO SYRP: 5.0000 mL | ORAL_SOLUTION | Freq: Four times a day (QID) | ORAL | 2 refills | Status: DC | PRN

## 2022-07-14 NOTE — Telephone Encounter (Signed)
Fine to refill x 3  thanks

## 2022-07-14 NOTE — Telephone Encounter (Signed)
Called and spoke with patient. She confirmed she is requesting a refill on her promethazine-dextromethorphan.   Dr. Annamaria Boots, can you please advise? Thanks!

## 2022-07-14 NOTE — Telephone Encounter (Signed)
Called and spoke with patient. She is aware that Dr. Annamaria Boots is ok with the refill. RX has been sent to pharmacy.   Nothing further needed at time of call.

## 2022-07-19 ENCOUNTER — Other Ambulatory Visit (HOSPITAL_COMMUNITY): Payer: Self-pay

## 2022-07-20 NOTE — Progress Notes (Signed)
HPI female never smoker/+ secondhand, followed for COPD with asthma, probably chronic fixed asthma PFT 03/10/2014-severe obstructive airways disease, no response to BD, normal TLC, normal DLCO. FVC 2.22/84%, FEV1 0.95/44%, ratio 0.43 Office Spirometry-08/02/2015-severe obstructive airways disease.(Done after Xopenex nebulizer treatment) FVC 2.58/102%, FEV1 1.00/47%, FEV1/FVC 0.39, 25-75 0.41/14% ECHO- 06/23/16- EF 65-70 percent a1AT- WNL 124 MM 07/10/12 Allergy Profile 12/05/13-total IgE normal 101 with minimal elevations for dust mite and [place] pollen. CBC  normal eosinophils Office Spirometry 07/04/16- moderate obstructive airways disease-FVC 2.59/100%, FEV1 1.31/63%, ratio 0.51, FEF 25-75% 0.69/30% PFT-PFT 04/21/22-severe obstructive airways disease insignificant response to bronchodilator normal diffusion -------------------------------------------------------------------------------------------------------. Jocelyn Myers  04/21/22- 57 year old female never smoker/+ secondhand, followed for Asthma/COPD Overlap, allergic rhinitis, Eczema, obesity EDvisit for COPD exacerbation in January- Covid negative.  -Trelegy 100, singulair, ProAir hfa, Neb Duoneb, flonase, Dupixent???  Last prednisone taper  8/30          Covid vax-3 Phizer Flu vax- Dupixent was approved Nov,2022 so what is status?? Lab- 10/18/21- EOS 0.1, IgE 231 H PFT 04/21/22-severe obstructive airways disease insignificant response to bronchodilator normal diffusion -----Pt had PFT today, pt states she has had cough and chest congestion for 3 weeks  PFT reviewed.  She reports worse chest congestion cough and wheeze with nasal congestion and drainage, yellowish sputum but no fever or sore throat over the last 3 weeks.  Not felt that she had an acute infection.  Routine meds are not helping enough.  Prednisone usually helps.  We discussed steroid therapy and Biologics.  We had tried for Dupixent last year but her insurance would not cover.  She has  changed insurance.  07/21/22- 57 year old female never smoker/+ secondhand, followed for Asthma/COPD Overlap, allergic rhinitis, Eczema, obesity  -Trelegy 200, singulair, ProAir hfa, Neb Duoneb, flonase, Dupixent         Covid vax-3 Phizer Flu vax- ED 05/24/22- Short of breath ED 06/28/22- COPD exacerb> prednisone -------Pt states she is wheezing and has productive cough with bright yellow-greenish yellow, and sob. Symptoms have been present over the last few months We discussed recent exacerbations and current status.  Using nebulizer about once daily.  Easy dyspnea on exertion.  Cough variably productive with white to trace yellow-green sputum, no blood and no fever. I suggested we refer her to pulmonary rehabilitation.  She would benefit from this if she can schedule.  Currently she is taking accounting classes.  Needs refill of Trelegy.  Has now had 4 rounds of Dupixent.  We discussed continuing this perhaps for 6 months before final decision about effectiveness. CXR 06/28/22- 1V  The heart size and mediastinal contours are within normal limits. Both lungs are clear. The visualized skeletal structures are unremarkable. IMPRESSION: No active disease.     ROS-see HPI   + = positive Constitutional:   No-   weight loss, night sweats, fevers, chills, fatigue, lassitude. HEENT:   No-  headaches, difficulty swallowing, tooth/dental problems, +sore throat,       sneezing, itching, ear ache, +nasal congestion, post nasal drip,  CV:  No-   chest pain, orthopnea, PND, swelling in lower extremities, anasarca, dizziness, palpitations Resp: +  shortness of breath with exertion or at rest.               +productive cough, + non-productive cough,  No- coughing up of blood.              No-   change in color of mucus.  + wheezing.   Skin: No-   rash  or lesions. GI:  No-   heartburn, indigestion, abdominal pain, nausea, vomiting,  GU: n. MS:  No-   joint pain or swelling.   Neuro-     nothing  unusual Psych:  No- change in mood or affect. No depression or anxiety.  No memory loss.  OBJ- Physical Exam    General- Alert, Oriented, Affect-appropriate, Distress- none acute, + overweight Skin- rash-none, lesions- none, excoriation- none Lymphadenopathy- none Head- atraumatic            Eyes- Gross vision intact, PERRLA, conjunctivae and secretions clear            Ears- Hearing, canals-normal            Nose- Clear, no-Septal dev, mucus, polyps, erosion, perforation. + nasal crease             Throat- Mallampati III , mucosa-, drainage- none, tonsils- atrophic, own teeth Neck- flexible , trachea midline, no stridor , thyroid nl, carotid no bruit Chest - symmetrical excursion , unlabored           Heart/CV- RRR , no murmur , no gallop  , no rub, nl s1 s2                           - JVD- none , edema- none, stasis changes- none, varices- none           Lung-  +minimal coarse sounds in bases, wheeze+trace, cough+, dullness-none, rub- none           Chest wall-  Abd-  Br/ Gen/ Rectal- Not done, not indicated Extrem- cyanosis- none, clubbing, none, atrophy- none, strength- nl Neuro- grossly intact to observation

## 2022-07-21 ENCOUNTER — Ambulatory Visit (INDEPENDENT_AMBULATORY_CARE_PROVIDER_SITE_OTHER): Payer: Commercial Managed Care - HMO | Admitting: Internal Medicine

## 2022-07-21 ENCOUNTER — Encounter: Payer: Self-pay | Admitting: Internal Medicine

## 2022-07-21 ENCOUNTER — Other Ambulatory Visit: Payer: Self-pay | Admitting: Internal Medicine

## 2022-07-21 VITALS — BP 108/66 | HR 78 | Ht 61.0 in | Wt 206.4 lb

## 2022-07-21 DIAGNOSIS — J4489 Other specified chronic obstructive pulmonary disease: Secondary | ICD-10-CM | POA: Diagnosis not present

## 2022-07-21 DIAGNOSIS — E669 Obesity, unspecified: Secondary | ICD-10-CM | POA: Diagnosis not present

## 2022-07-21 MED ORDER — AMOXICILLIN-POT CLAVULANATE 875-125 MG PO TABS: 1.0000 | ORAL_TABLET | Freq: Two times a day (BID) | ORAL | 0 refills | Status: DC

## 2022-07-21 MED ORDER — TRELEGY ELLIPTA 200-62.5-25 MCG/ACT IN AEPB: INHALATION_SPRAY | RESPIRATORY_TRACT | 12 refills | Status: DC

## 2022-07-21 NOTE — Patient Instructions (Addendum)
Order- refer to [facility]     dx severe Asthma/ COPD overlap  Script sent for augmentin (amoxicillin clavulanate) Script sent refilling Trelegy 200  Walk as much as you can to keep your stamina up.

## 2022-07-21 NOTE — Assessment & Plan Note (Addendum)
Severe obstructive airways disease.  Currently may be near baseline recognizing 2 ED visits during the winter. Plan-refer for pulmonary rehabilitation if she can schedule.  Refill Trelegy 200.  Encouraged her to walk for endurance.  We will try a course of Augmentin to see if that clears bronchitis suggested by discolored sputum.

## 2022-07-21 NOTE — Assessment & Plan Note (Signed)
Overweight but not dangerously obese.  Encouraged to walk for stamina, which will also help with weight control.  Episodic prednisone does not help.

## 2022-07-24 ENCOUNTER — Other Ambulatory Visit: Payer: Self-pay

## 2022-07-27 NOTE — Telephone Encounter (Signed)
Medications from outside sources need reconciliation.     Changes Requested   FLOVENT HFA 44 MCG/ACT inhaler       Changed from: Fluticasone-Umeclidin-Vilant (TRELEGY ELLIPTA) 200-62.5-25 MCG/ACT AEPB   All pharmacy suggested alternatives are listed below   Sig: N/A   Disp: Not specified    Refills: 0   Start: 07/21/2022   Class: Normal   Non-formulary   Last ordered: 6 days ago (07/21/2022) by Deneise Lever, MD   Last refill: 07/21/2022   Rx #: [id number]   Pharmacy comment: Alternative Requested:PA.  All Pharmacy Suggested Alternatives:  fluticasone (FLOVENT HFA) 44 MCG/ACT inhaler Fluticasone Propionate, Inhal, (FLOVENT DISKUS) 50 MCG/ACT AEPB umeclidinium-vilanterol (ANORO ELLIPTA) 62.5-25 MCG/ACT AEPB  To prescribe one of the alternatives listed above, open the encounter and click Replace.  Open Encounter     To be filled at: [id number] - [place], [place].      Dr. Annamaria Boots, please advise.

## 2022-08-01 NOTE — Telephone Encounter (Signed)
Script sent for [facility] inhaler to replace Trelegy.   Inhale 2 puffs then rinse mouth, twice daily

## 2022-08-07 ENCOUNTER — Telehealth: Payer: Self-pay | Admitting: Internal Medicine

## 2022-08-07 NOTE — Telephone Encounter (Signed)
Received a call from [employer] stating that the Trelegy 200 was requiring a PA. Routing to prior [place] team.

## 2022-08-08 ENCOUNTER — Telehealth: Payer: Self-pay | Admitting: Internal Medicine

## 2022-08-08 ENCOUNTER — Other Ambulatory Visit (HOSPITAL_COMMUNITY): Payer: Self-pay

## 2022-08-08 ENCOUNTER — Other Ambulatory Visit: Payer: Self-pay

## 2022-08-08 ENCOUNTER — Other Ambulatory Visit: Payer: Self-pay | Admitting: Internal Medicine

## 2022-08-08 MED ORDER — TRELEGY ELLIPTA 200-62.5-25 MCG/ACT IN AEPB: 1.0000 | INHALATION_SPRAY | Freq: Every day | RESPIRATORY_TRACT | 0 refills | Status: DC

## 2022-08-08 NOTE — Telephone Encounter (Signed)
Pt doesn't have 2 refills left on Promethazine. Pt needs a refill sent in

## 2022-08-08 NOTE — Telephone Encounter (Signed)
ATC LVMTCB x 1 looks like pt should still have 2 refills left on Promethazine - DM from order was placed in early March.

## 2022-08-08 NOTE — Telephone Encounter (Signed)
Jocelyn Myers has been sent in to replace Trelegy

## 2022-08-08 NOTE — Telephone Encounter (Signed)
Pt. Came to pick up samples but also need reills on promethazine-dextromethorphan (PROMETHAZINE-DM) 6.25-15 MG/5ML syrup  sent to her pharmacy

## 2022-08-08 NOTE — Telephone Encounter (Signed)
Called pt and left message informing her that samples for Trelegy 200 MG will be left at the front desk for her. Nothing further needed at this time.

## 2022-08-09 MED ORDER — PROMETHAZINE-DM 6.25-15 MG/5ML PO SYRP: 5.0000 mL | ORAL_SOLUTION | Freq: Four times a day (QID) | ORAL | 2 refills | Status: DC | PRN

## 2022-08-09 NOTE — Telephone Encounter (Signed)
I [facility] and she stated pt does not have anymore refills on promethazine-dextromethorphan (PROMETHAZINE-DM) 6.25-15 MG/5ML syrup and has picked up every refill in a 3 weeks span. I asked the pharmacy tech to make a note every time pt picks up her medicine. Pharmacy tech verbalized understanding. Refill sent to pharmacy nothing further needed.

## 2022-08-14 NOTE — Telephone Encounter (Signed)
Refill for promethazine-DM was [facility] to [facility] on 08/09/2022.  This is a duplicate rx.

## 2022-08-15 ENCOUNTER — Other Ambulatory Visit (HOSPITAL_COMMUNITY): Payer: Self-pay

## 2022-08-15 ENCOUNTER — Telehealth: Payer: Self-pay

## 2022-08-15 ENCOUNTER — Encounter (HOSPITAL_COMMUNITY): Payer: Self-pay

## 2022-08-15 NOTE — Telephone Encounter (Signed)
PA request received via CMM for Dulera 100-5MCG/ACT aerosol  PA has been submitted to [facility] and is pending determination  Key: [id number]

## 2022-08-17 ENCOUNTER — Telehealth (HOSPITAL_COMMUNITY): Payer: Self-pay

## 2022-08-17 ENCOUNTER — Other Ambulatory Visit (HOSPITAL_COMMUNITY): Payer: Self-pay

## 2022-08-17 NOTE — Telephone Encounter (Signed)
Can we tell if insurance would cover Breztri?

## 2022-08-17 NOTE — Telephone Encounter (Signed)
Jocelyn Myers is not covered.

## 2022-08-17 NOTE — Telephone Encounter (Signed)
PA has been DENIED due to:   There is no indication that your patient has documented failure, inadequate response, contraindication per FDA label, or intolerance to one step 1 medication (budesonide/formoterol). An exception to the criteria will be provided when an individual is less than 55 years of age.Jocelyn Myers is considered medically necessary when there is a documented failure, inadequate response, contraindication per FDA label, or intolerance to one Step 1 drug (budesonide/formoterol). An exception to the criteria will be provided when an individual is less than 41 years of age.

## 2022-08-17 NOTE — Telephone Encounter (Signed)
Although she had been on Trelegy 200, apparently that and Markus Daft are not covered now and insurance wants documentation of Dulera trial if I understand correctly.  Please order Dulera 200, # 1, Inhale 2 puffs then rinse mouth ell, twice daily. This will be the new maintenance inhaler.  Ref x 5

## 2022-08-17 NOTE — Telephone Encounter (Signed)
Pt called to confirm pulmonary rehab orientation for tomorrow. Pt did not answer. Voicemail was left.

## 2022-08-17 NOTE — Telephone Encounter (Signed)
Dr. Maple Hudson, please advise. Markus Daft is not covered.

## 2022-08-17 NOTE — Telephone Encounter (Signed)
Dr. Maple Hudson, please see below message and advise. thanks

## 2022-08-17 NOTE — Telephone Encounter (Signed)
PA team, please advise. thanks

## 2022-08-18 ENCOUNTER — Other Ambulatory Visit: Payer: Self-pay

## 2022-08-18 ENCOUNTER — Encounter (HOSPITAL_COMMUNITY)
Admission: RE | Admit: 2022-08-18 | Discharge: 2022-08-18 | Disposition: A | Discharge status: Home / Self Care | Payer: Commercial Managed Care - HMO | Source: Ambulatory Visit | Attending: Internal Medicine | Admitting: Internal Medicine

## 2022-08-18 ENCOUNTER — Encounter (HOSPITAL_COMMUNITY): Payer: Self-pay

## 2022-08-18 VITALS — BP 98/60 | HR 98 | Ht 61.0 in | Wt 208.6 lb

## 2022-08-18 DIAGNOSIS — J449 Chronic obstructive pulmonary disease, unspecified: Secondary | ICD-10-CM | POA: Diagnosis not present

## 2022-08-18 MED ORDER — MOMETASONE FURO-FORMOTEROL FUM 200-5 MCG/ACT IN AERO: 2.0000 | INHALATION_SPRAY | Freq: Two times a day (BID) | RESPIRATORY_TRACT | 5 refills | Status: DC

## 2022-08-18 NOTE — Telephone Encounter (Signed)
Pt called the office and I let her know the info of the PA that was done for the Trelegy and she verbalized understanding. Rx for Elwin Sleight has been sent to preferred pharmacy. Nothing further needed.

## 2022-08-18 NOTE — Progress Notes (Signed)
Pulmonary Individual Treatment Plan  Patient Details  Name: Jocelyn Myers MRN: [id number] Date of Birth: March 15, 1966 Referring Provider:   Doristine Devoid Pulmonary Rehab Walk Test from 08/18/2022 in [facility]  Referring Provider Young       Initial Encounter Date:  Flowsheet Row Pulmonary Rehab Walk Test from 08/18/2022 in [facility]  Date 08/18/22       Visit Diagnosis: Stage 3 severe COPD by GOLD classification  Patient's Home Medications on Admission:   Current Outpatient Medications:    albuterol (PROAIR HFA) 108 (90 BASE) MCG/ACT inhaler, Inhale 2 puffs into the lungs every 6 (six) hours as needed for wheezing or shortness of breath., Disp: 3 Inhaler, Rfl: 1   Dupilumab (DUPIXENT) 300 MG/2ML SOPN, Inject 300 mg into the skin every 14 (fourteen) days., Disp: 4 mL, Rfl: 5   fluticasone (FLONASE) 50 MCG/ACT nasal spray, Place 2 sprays into both nostrils daily., Disp: 16 g, Rfl: 2   ipratropium-albuterol (DUONEB) 0.5-2.5 (3) MG/3ML SOLN, Inhale 1 neb every 6 hours as needed, Disp: 75 mL, Rfl: 12   mometasone-formoterol (DULERA) 200-5 MCG/ACT AERO, Inhale 2 puffs into the lungs in the morning and at bedtime., Disp: 13 g, Rfl: 5   montelukast (SINGULAIR) 10 MG tablet, TAKE 1 TABLET BY MOUTH EVERYDAY AT BEDTIME, Disp: 90 tablet, Rfl: 1   Multiple Vitamin (MULTIVITAMIN) tablet, Take 1 tablet by mouth daily., Disp: , Rfl:    promethazine-dextromethorphan (PROMETHAZINE-DM) 6.25-15 MG/5ML syrup, Take 5 mLs by mouth 4 (four) times daily as needed for cough., Disp: 118 mL, Rfl: 2   amoxicillin-clavulanate (AUGMENTIN) 875-125 MG tablet, Take 1 tablet by mouth 2 (two) times daily. (Patient not taking: Reported on 08/18/2022), Disp: 14 tablet, Rfl: 0   benzonatate (TESSALON) 100 MG capsule, TAKE 1 CAPSULE BY MOUTH EVERY 8 HOURS AS NEEDED FOR COUGH (Patient not taking: Reported on  08/18/2022), Disp: 40 capsule, Rfl: 0   guaiFENesin (MUCINEX) 600 MG 12 hr tablet, Take 1 tablet (600 mg total) by mouth 2 (two) times daily. (Patient not taking: Reported on 08/18/2022), Disp: , Rfl:   Past Medical History: Past Medical History:  Diagnosis Date   Asthma    COPD (chronic obstructive pulmonary disease)     Tobacco Use: Social History   Tobacco Use  Smoking Status Never  Smokeless Tobacco Never    Labs: Review Flowsheet       Latest Ref Rng & Units 02/23/2014 06/23/2016 07/17/2018  Labs for ITP Cardiac and Pulmonary Rehab  Cholestrol 0 - 200 mg/dL 409  - 811   LDL (calc) 0 - 99 mg/dL 914  - 98   HDL-C >78.29 mg/dL 56.21  - 30.86   Trlycerides 0.0 - 149.0 mg/dL 57.8  - 46.9   Bicarbonate 20.0 - 28.0 mmol/L - 25.9  -  TCO2 0 - 100 mmol/L - 28  -  Acid-base deficit 0.0 - 2.0 mmol/L - 2.0  -  O2 Saturation % - 70.0  -    Capillary Blood Glucose: Lab Results  Component Value Date   GLUCAP 126 (H) 06/24/2016   GLUCAP 136 (H) 06/24/2016   GLUCAP 122 (H) 06/23/2016   GLUCAP 168 (H) 06/23/2016   GLUCAP 167 (H) 06/23/2016     Pulmonary Assessment Scores:  Pulmonary Assessment Scores     Row Name 08/18/22 1107         ADL UCSD  ADL Phase Entry     SOB Score total 23       CAT Score   CAT Score 25       mMRC Score   mMRC Score 1             UCSD: Self-administered rating of dyspnea associated with activities of daily living (ADLs) 6-point scale (0 = "not at all" to 5 = "maximal or unable to do because of breathlessness")  Scoring Scores range from 0 to 120.  Minimally important difference is 5 units  CAT: CAT can identify the health impairment of COPD patients and is better correlated with disease progression.  CAT has a scoring range of zero to 40. The CAT score is classified into four groups of low (less than 10), medium (10 - 20), high (21-30) and very high (31-40) based on the impact level of disease on health status. A CAT score over 10  suggests significant symptoms.  A worsening CAT score could be explained by an exacerbation, poor medication adherence, poor inhaler technique, or progression of COPD or comorbid conditions.  CAT MCID is 2 points  mMRC: mMRC (Modified Medical Research Council) Dyspnea Scale is used to assess the degree of baseline functional disability in patients of respiratory disease due to dyspnea. No minimal important difference is established. A decrease in score of 1 point or greater is considered a positive change.   Pulmonary Function Assessment:  Pulmonary Function Assessment - 08/18/22 1158       Breath   Bilateral Breath Sounds Clear    Shortness of Breath No             Exercise Target Goals: Exercise Program Goal: Individual exercise prescription set using results from initial 6 min walk test and THRR while considering  patient's activity barriers and safety.   Exercise Prescription Goal: Initial exercise prescription builds to 30-45 minutes a day of aerobic activity, 2-3 days per week.  Home exercise guidelines will be given to patient during program as part of exercise prescription that the participant will acknowledge.  Activity Barriers & Risk Stratification:  Activity Barriers & Cardiac Risk Stratification - 08/18/22 1056       Activity Barriers & Cardiac Risk Stratification   Activity Barriers Deconditioning;Shortness of Breath;Muscular Weakness             6 Minute Walk:  6 Minute Walk     Row Name 08/18/22 1202         6 Minute Walk   Phase Initial     Distance 2670 feet     Walk Time 6 minutes     # of Rest Breaks 0     MPH 5.06     METS 5.58     RPE 11     Perceived Dyspnea  0     VO2 Peak 19.54     Symptoms No     Resting HR 96 bpm     Resting BP 98/60     Resting Oxygen Saturation  98 %     Exercise Oxygen Saturation  during 6 min walk 93 %     Max Ex. HR 126 bpm     Max Ex. BP 122/74     2 Minute Post BP 98/74       Interval HR   1 Minute  HR 111     2 Minute HR 108     3 Minute HR 110     4 Minute HR 110  5 Minute HR 112     6 Minute HR 126     2 Minute Post HR 100     Interval Heart Rate? Yes       Interval Oxygen   Interval Oxygen? Yes     Baseline Oxygen Saturation % 98 %     1 Minute Oxygen Saturation % 98 %     1 Minute Liters of Oxygen 0 L     2 Minute Oxygen Saturation % 93 %     2 Minute Liters of Oxygen 0 L     3 Minute Oxygen Saturation % 96 %     3 Minute Liters of Oxygen 0 L     4 Minute Oxygen Saturation % 94 %     4 Minute Liters of Oxygen 0 L     5 Minute Oxygen Saturation % 96 %     5 Minute Liters of Oxygen 0 L     6 Minute Oxygen Saturation % 94 %     6 Minute Liters of Oxygen 0 L     2 Minute Post Oxygen Saturation % 100 %     2 Minute Post Liters of Oxygen 0 L              Oxygen Initial Assessment:  Oxygen Initial Assessment - 08/18/22 1100       Home Oxygen   Home Oxygen Device None    Sleep Oxygen Prescription None    Home Exercise Oxygen Prescription None    Home Resting Oxygen Prescription None      Initial 6 min Walk   Oxygen Used None      Program Oxygen Prescription   Program Oxygen Prescription None      Intervention   Short Term Goals To learn and demonstrate proper use of respiratory medications;To learn and understand importance of maintaining oxygen saturations>88%;To learn and understand importance of monitoring SPO2 with pulse oximeter and demonstrate accurate use of the pulse oximeter.;To learn and demonstrate proper pursed lip breathing techniques or other breathing techniques.     Long  Term Goals Maintenance of O2 saturations>88%;Compliance with respiratory medication;Verbalizes importance of monitoring SPO2 with pulse oximeter and return demonstration;Exhibits proper breathing techniques, such as pursed lip breathing or other method taught during program session;Demonstrates proper use of MDI's             Oxygen Re-Evaluation:   Oxygen Discharge  (Final Oxygen Re-Evaluation):   Initial Exercise Prescription:  Initial Exercise Prescription - 08/18/22 1200       Date of Initial Exercise RX and Referring Provider   Date 08/18/22    Referring Provider Young    Expected Discharge Date 11/16/22      Treadmill   MPH 2    Grade 0    Minutes 15      Recumbant Elliptical   Level 1    RPM 20    Watts 40    Minutes 15      Prescription Details   Frequency (times per week) 2    Duration Progress to 30 minutes of continuous aerobic without signs/symptoms of physical distress      Intensity   Ratings of Perceived Exertion 11-13    Perceived Dyspnea 0-4      Progression   Progression Continue to progress workloads to maintain intensity without signs/symptoms of physical distress.      Resistance Training   Training Prescription Yes    Weight blue bands  Reps 10-15             Perform Capillary Blood Glucose checks as needed.  Exercise Prescription Changes:   Exercise Comments:   Exercise Goals and Review:   Exercise Goals     Row Name 08/18/22 1056             Exercise Goals   Increase Physical Activity Yes       Intervention Provide advice, education, support and counseling about physical activity/exercise needs.;Develop an individualized exercise prescription for aerobic and resistive training based on initial evaluation findings, risk stratification, comorbidities and participant's personal goals.       Expected Outcomes Short Term: Attend rehab on a regular basis to increase amount of physical activity.;Long Term: Exercising regularly at least 3-5 days a week.;Long Term: Add in home exercise to make exercise part of routine and to increase amount of physical activity.       Increase Strength and Stamina Yes       Intervention Provide advice, education, support and counseling about physical activity/exercise needs.;Develop an individualized exercise prescription for aerobic and resistive training based  on initial evaluation findings, risk stratification, comorbidities and participant's personal goals.       Expected Outcomes Short Term: Increase workloads from initial exercise prescription for resistance, speed, and METs.;Short Term: Perform resistance training exercises routinely during rehab and add in resistance training at home;Long Term: Improve cardiorespiratory fitness, muscular endurance and strength as measured by increased METs and functional capacity ( )       Able to understand and use rate of perceived exertion (RPE) scale Yes       Intervention Provide education and explanation on how to use RPE scale       Expected Outcomes Short Term: Able to use RPE daily in rehab to express subjective intensity level;Long Term:  Able to use RPE to guide intensity level when exercising independently       Able to understand and use Dyspnea scale Yes       Intervention Provide education and explanation on how to use Dyspnea scale       Expected Outcomes Short Term: Able to use Dyspnea scale daily in rehab to express subjective sense of shortness of breath during exertion;Long Term: Able to use Dyspnea scale to guide intensity level when exercising independently       Knowledge and understanding of Target Heart Rate Range (THRR) Yes       Intervention Provide education and explanation of THRR including how the numbers were predicted and where they are located for reference       Expected Outcomes Short Term: Able to state/look up THRR;Long Term: Able to use THRR to govern intensity when exercising independently;Short Term: Able to use daily as guideline for intensity in rehab       Understanding of Exercise Prescription Yes       Intervention Provide education, explanation, and written materials on patient's individual exercise prescription       Expected Outcomes Short Term: Able to explain program exercise prescription;Long Term: Able to explain home exercise prescription to exercise independently                 Exercise Goals Re-Evaluation :   Discharge Exercise Prescription (Final Exercise Prescription Changes):   Nutrition:  Target Goals: Understanding of nutrition guidelines, daily intake of sodium 1500mg , cholesterol 200mg , calories 30% from fat and 7% or less from saturated fats, daily to have 5 or more servings of fruits and  vegetables.  Biometrics:  Pre Biometrics - 08/18/22 1206       Pre Biometrics   Grip Strength 20 kg              Nutrition Therapy Plan and Nutrition Goals:   Nutrition Assessments:  MEDIFICTS Score Key: ?70 Need to make dietary changes  40-70 Heart Healthy Diet ? 40 Therapeutic Level Cholesterol Diet   Picture Your Plate Scores: <21 Unhealthy dietary pattern with much room for improvement. 41-50 Dietary pattern unlikely to meet recommendations for good health and room for improvement. 51-60 More healthful dietary pattern, with some room for improvement.  >60 Healthy dietary pattern, although there may be some specific behaviors that could be improved.    Nutrition Goals Re-Evaluation:   Nutrition Goals Discharge (Final Nutrition Goals Re-Evaluation):   Psychosocial: Target Goals: Acknowledge presence or absence of significant depression and/or stress, maximize coping skills, provide positive support system. Participant is able to verbalize types and ability to use techniques and skills needed for reducing stress and depression.  Initial Review & Psychosocial Screening:  Initial Psych Review & Screening - 08/18/22 1052       Initial Review   Current issues with None Identified      Family Dynamics   Good Support System? Yes      Barriers   Psychosocial barriers to participate in program There are no identifiable barriers or psychosocial needs.      Screening Interventions   Interventions Encouraged to exercise             Quality of Life Scores:  Scores of 19 and below usually indicate a poorer  quality of life in these areas.  A difference of  2-3 points is a clinically meaningful difference.  A difference of 2-3 points in the total score of the Quality of Life Index has been associated with significant improvement in overall quality of life, self-image, physical symptoms, and general health in studies assessing change in quality of life.  PHQ-9: Review Flowsheet       08/18/2022 06/02/2019 07/05/2018  Depression screen PHQ 2/9  Decreased Interest 0 0 0  Down, Depressed, Hopeless 0 0 0  PHQ - 2 Score 0 0 0  Altered sleeping 1 - -  Tired, decreased energy 0 - -  Change in appetite 2 - -  Feeling bad or failure about yourself  0 - -  Trouble concentrating 0 - -  Moving slowly or fidgety/restless 0 - -  Suicidal thoughts 0 - -  PHQ-9 Score 3 - -  Difficult doing work/chores Somewhat difficult - -   Interpretation of Total Score  Total Score Depression Severity:  1-4 = Minimal depression, 5-9 = Mild depression, 10-14 = Moderate depression, 15-19 = Moderately severe depression, 20-27 = Severe depression   Psychosocial Evaluation and Intervention:  Psychosocial Evaluation - 08/18/22 1053       Psychosocial Evaluation & Interventions   Interventions Encouraged to exercise with the program and follow exercise prescription    Comments Pt denies any psychosocial barriers or concerns at this time    Expected Outcomes For pt to participate in PR free of any psychosocial barriers or concerns    Continue Psychosocial Services  No Follow up required             Psychosocial Re-Evaluation:   Psychosocial Discharge (Final Psychosocial Re-Evaluation):   Education: Education Goals: Education classes will be provided on a weekly basis, covering required topics. Participant will state understanding/return demonstration of  topics presented.  Learning Barriers/Preferences:  Learning Barriers/Preferences - 08/18/22 1054       Learning Barriers/Preferences   Learning Barriers  None    Learning Preferences None             Education Topics: Introduction to Pulmonary Rehab Group instruction provided by PowerPoint, verbal discussion, and written material to support subject matter. Instructor reviews what Pulmonary Rehab is, the purpose of the program, and how patients are referred.     Know Your Numbers Group instruction that is supported by a PowerPoint presentation. Instructor discusses importance of knowing and understanding resting, exercise, and post-exercise oxygen saturation, heart rate, and blood pressure. Oxygen saturation, heart rate, blood pressure, rating of perceived exertion, and dyspnea are reviewed along with a normal range for these values.    Exercise for the Pulmonary Patient Group instruction that is supported by a PowerPoint presentation. Instructor discusses benefits of exercise, core components of exercise, frequency, duration, and intensity of an exercise routine, importance of utilizing pulse oximetry during exercise, safety while exercising, and options of places to exercise outside of rehab.       MET Level  Group instruction provided by PowerPoint, verbal discussion, and written material to support subject matter. Instructor reviews what METs are and how to increase METs.    Pulmonary Medications Verbally interactive group education provided by instructor with focus on inhaled medications and proper administration.   Anatomy and Physiology of the Respiratory System Group instruction provided by PowerPoint, verbal discussion, and written material to support subject matter. Instructor reviews respiratory cycle and anatomical components of the respiratory system and their functions. Instructor also reviews differences in obstructive and restrictive respiratory diseases with examples of each.    Oxygen Safety Group instruction provided by PowerPoint, verbal discussion, and written material to support subject matter. There is an  overview of "What is Oxygen" and "Why do we need it".  Instructor also reviews how to create a safe environment for oxygen use, the importance of using oxygen as prescribed, and the risks of noncompliance. There is a brief discussion on traveling with oxygen and resources the patient may utilize.   Oxygen Use Group instruction provided by PowerPoint, verbal discussion, and written material to discuss how supplemental oxygen is prescribed and different types of oxygen supply systems. Resources for more information are provided.    Breathing Techniques Group instruction that is supported by demonstration and informational handouts. Instructor discusses the benefits of pursed lip and diaphragmatic breathing and detailed demonstration on how to perform both.     Risk Factor Reduction Group instruction that is supported by a PowerPoint presentation. Instructor discusses the definition of a risk factor, different risk factors for pulmonary disease, and how the heart and lungs work together.   MD Day A group question and answer session with a medical doctor that allows participants to ask questions that relate to their pulmonary disease state.   Nutrition for the Pulmonary Patient Group instruction provided by PowerPoint slides, verbal discussion, and written materials to support subject matter. The instructor gives an explanation and review of healthy diet recommendations, which includes a discussion on weight management, recommendations for fruit and vegetable consumption, as well as protein, fluid, caffeine, fiber, sodium, sugar, and alcohol. Tips for eating when patients are short of breath are discussed.    Other Education Group or individual verbal, written, or video instructions that support the educational goals of the pulmonary rehab program.    Knowledge Questionnaire Score:  Knowledge Questionnaire Score -  08/18/22 1110       Knowledge Questionnaire Score   Pre Score 16/18              Core Components/Risk Factors/Patient Goals at Admission:  Personal Goals and Risk Factors at Admission - 08/18/22 1055       Core Components/Risk Factors/Patient Goals on Admission    Weight Management Weight Loss    Improve shortness of breath with ADL's Yes    Intervention Provide education, individualized exercise plan and daily activity instruction to help decrease symptoms of SOB with activities of daily living.    Expected Outcomes Short Term: Improve cardiorespiratory fitness to achieve a reduction of symptoms when performing ADLs;Long Term: Be able to perform more ADLs without symptoms or delay the onset of symptoms    Increase knowledge of respiratory medications and ability to use respiratory devices properly  Yes    Intervention Provide education and demonstration as needed of appropriate use of medications, inhalers, and oxygen therapy.    Expected Outcomes Short Term: Achieves understanding of medications use. Understands that oxygen is a medication prescribed by physician. Demonstrates appropriate use of inhaler and oxygen therapy.;Long Term: Maintain appropriate use of medications, inhalers, and oxygen therapy.             Core Components/Risk Factors/Patient Goals Review:    Core Components/Risk Factors/Patient Goals at Discharge (Final Review):    ITP Comments:   Comments: Dr. Mechele Collin is Medical Director for Pulmonary Rehab at [facility].

## 2022-08-18 NOTE — Progress Notes (Signed)
Jocelyn Myers 57 y.o. female Pulmonary Rehab Orientation Note This patient who was referred to Pulmonary Rehab by Dr. Maple Hudson with the diagnosis of COPD 3 arrived today in Cardiac and Pulmonary Rehab. She  arrived ambulatory with normal gait. She  does not carry portable oxygen.  Per patient, Jocelyn Myers uses oxygen never. Color good, skin warm and dry. Patient is oriented to time and place. Patient's medical history, psychosocial health, and medications reviewed. Psychosocial assessment reveals patient lives with spouse. 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 Patient hobbies include spending time with others, reading, and traveling . Patient reports her stress level is low. Patient states she has no stress. Patient does not exhibit signs of depression.  PHQ2/9 score 0/3. Jocelyn Myers shows good  coping skills with positive outlook on life. Offered emotional support and reassurance. Will continue to monitor. Physical assessment performed by Jocelyn Mediate, RN. Please see their orientation physical assessment note. Jocelyn Myers reports she does take medications as prescribed. Patient states she has started eating a vegan diet. Patient's weight will be monitored closely. Demonstration and practice of PLB using pulse oximeter. Jocelyn Myers able to return demonstration satisfactorily. Safety and hand hygiene in the exercise area reviewed with patient. Jocelyn Myers voices understanding of the information reviewed. Department expectations discussed with patient and achievable goals were set. The patient shows enthusiasm about attending the program and we look forward to working with Jocelyn Myers. Jocelyn Myers completed a 6 min walk test today and is scheduled to begin exercise on 4/18@ 1:15.   1030-1200 Jocelyn Myers, BSRT

## 2022-08-18 NOTE — Addendum Note (Signed)
Addended by: Wyvonne Lenz on: 08/18/2022 09:37 AM   Modules accepted: Orders

## 2022-08-18 NOTE — Progress Notes (Signed)
Pulmonary Rehab Orientation Physical Assessment Note  Asked to check the following on Jocelyn Myers. Physical assessment reveal: Pt is alert and oriented x 4.  Heart sounds normal (APETM). Bowel sounds present and normal. Pt denies/endorses abdominal discomfort, nausea, vomiting or diarrhea. Distal pulses 1+. No swelling to lower extremities.

## 2022-08-18 NOTE — Telephone Encounter (Signed)
Attempted to call pt but unable to reach. Left message to return call.  

## 2022-08-21 ENCOUNTER — Other Ambulatory Visit (HOSPITAL_COMMUNITY): Payer: Self-pay

## 2022-08-24 ENCOUNTER — Encounter (HOSPITAL_COMMUNITY)
Admission: RE | Admit: 2022-08-24 | Discharge: 2022-08-24 | Disposition: A | Discharge status: Home / Self Care | Payer: Commercial Managed Care - HMO | Source: Ambulatory Visit | Attending: Internal Medicine | Admitting: Internal Medicine

## 2022-08-24 DIAGNOSIS — J449 Chronic obstructive pulmonary disease, unspecified: Secondary | ICD-10-CM | POA: Diagnosis not present

## 2022-08-24 NOTE — Progress Notes (Signed)
Daily Session Note  Patient Details  Name: Joc[facility] MRN: [id number] Date of Birth: 05-29-1965 Referring Provider:   Doristine Devoid Pulmonary Rehab Walk Test from 08/18/2022 in [facility]  Referring Provider Young       Encounter Date: 08/24/2022  Check In:  Session Check In - 08/24/22 1452       Check-In   Supervising physician immediately available to respond to emergencies [facility] MD immediately available    Physician(s) Eligha Bridegroom, NP    Location [facility]    Staff Present Samantha Belarus, RD, Dutch Gray, RN, Doris Cheadle, MS, ACSM-CEP, Exercise Physiologist;Randi Dionisio Paschal, ACSM-CEP, Exercise Physiologist;Dillon Mcreynolds Katrinka Blazing, RT    Virtual Visit No    Medication changes reported     No    Fall or balance concerns reported    No    Tobacco Cessation No Change    Warm-up and Cool-down Performed as group-led instruction    Resistance Training Performed Yes    VAD Patient? No    PAD/SET Patient? No      Pain Assessment   Currently in Pain? No/denies    Multiple Pain Sites No             Capillary Blood Glucose: No results found for this or any previous visit (from the past 24 hour(s)).    Social History   Tobacco Use  Smoking Status Never  Smokeless Tobacco Never    Goals Met:  Proper associated with RPD/PD & O2 Sat Independence with exercise equipment Exercise tolerated well No report of concerns or symptoms today Strength training completed today  Goals Unmet:  Not Applicable  Comments: Service time is from 1320 to 1445.    Dr. Mechele Collin is Medical Director for Pulmonary Rehab at [facility].

## 2022-08-28 ENCOUNTER — Other Ambulatory Visit (HOSPITAL_COMMUNITY): Payer: Self-pay

## 2022-08-29 ENCOUNTER — Encounter (HOSPITAL_COMMUNITY)
Admission: RE | Admit: 2022-08-29 | Discharge: 2022-08-29 | Disposition: A | Discharge status: Home / Self Care | Payer: Commercial Managed Care - HMO | Source: Ambulatory Visit | Attending: Internal Medicine | Admitting: Internal Medicine

## 2022-08-29 DIAGNOSIS — J449 Chronic obstructive pulmonary disease, unspecified: Secondary | ICD-10-CM | POA: Diagnosis not present

## 2022-08-29 NOTE — Progress Notes (Signed)
Daily Session Note  Patient Details  Name: Jocelyn Myers MRN: [id number] Date of Birth: 07/05/1965 Referring Provider:   Doristine Devoid Pulmonary Rehab Walk Test from 08/18/2022 in [facility]  Referring Provider Young       Encounter Date: 08/29/2022  Check In:  Session Check In - 08/29/22 1513       Check-In   Supervising physician immediately available to respond to emergencies [facility] MD immediately available    Physician(s) Eligha Bridegroom, NP    [facility]    Staff Present Samantha Belarus, RD, Dutch Gray, RN, BSN;Randi Reeve BS, ACSM-CEP, Exercise Physiologist;Kaylee Earlene Plater, MS, ACSM-CEP, Exercise Physiologist;Lidiya Reise Katrinka Blazing, RT    Virtual Visit No    Medication changes reported     No    Fall or balance concerns reported    No    Tobacco Cessation No Change    Warm-up and Cool-down Performed as group-led instruction    Resistance Training Performed Yes    VAD Patient? No    PAD/SET Patient? No      Pain Assessment   Currently in Pain? No/denies    Multiple Pain Sites No             Capillary Blood Glucose: No results found for this or any previous visit (from the past 24 hour(s)).    Social History   Tobacco Use  Smoking Status Never  Smokeless Tobacco Never    Goals Met:  Proper associated with RPD/PD & O2 Sat Independence with exercise equipment Exercise tolerated well No report of concerns or symptoms today Strength training completed today  Goals Unmet:  Not Applicable  Comments: Service time is from 1314 to 1450.    Dr. Mechele Collin is Medical Director for Pulmonary Rehab at [facility].

## 2022-08-31 ENCOUNTER — Encounter (HOSPITAL_COMMUNITY)
Admission: RE | Admit: 2022-08-31 | Discharge: 2022-08-31 | Disposition: A | Discharge status: Home / Self Care | Payer: Commercial Managed Care - HMO | Source: Ambulatory Visit | Attending: Internal Medicine | Admitting: Internal Medicine

## 2022-08-31 VITALS — Wt 211.0 lb

## 2022-08-31 DIAGNOSIS — J449 Chronic obstructive pulmonary disease, unspecified: Secondary | ICD-10-CM | POA: Diagnosis not present

## 2022-08-31 NOTE — Progress Notes (Signed)
Daily Session Note  Patient Details  Name: Jocelyn Myers MRN: [id number] Date of Birth: [birth year] Referring Provider:   Doristine Myers Pulmonary Rehab Walk Test from 08/18/2022 [facility]  Referring Provider [name]       Encounter Date: 08/31/2022  Check In:  Session Check In - 08/31/22 1511       Check-In   Supervising physician immediately available to respond to emergencies [facility] MD immediately available    Physician(s) Jocelyn Person, NP    [facility]    Staff Present Jocelyn Hart, RN, BSN;Jocelyn Myers BS, ACSM-CEP, Exercise Physiologist;Jocelyn Earlene Plater, MS, ACSM-CEP, Exercise Physiologist;[facility], RT    Virtual Visit No    Medication changes reported     No    Fall or balance concerns reported    No    Tobacco Cessation No Change    Warm-up and Cool-down Performed as group-led instruction    Resistance Training Performed Yes    VAD Patient? No    PAD/SET Patient? No      Pain Assessment   Currently in Pain? No/denies    Multiple Pain Sites No             Capillary Blood Glucose: No results found for this or any previous visit (from the past 24 hour(s)).    Social History   Tobacco Use  Smoking Status Never  Smokeless Tobacco Never    Goals Met:  Independence with exercise equipment Improved SOB with ADL's Exercise tolerated well No report of concerns or symptoms today Strength training completed today  Goals Unmet:  Not Applicable  Comments: Service time is from 1310 to 1453    Dr. Mechele Myers is Medical Director for Pulmonary Rehab at [facility].

## 2022-09-05 ENCOUNTER — Encounter (HOSPITAL_COMMUNITY): Payer: Commercial Managed Care - HMO

## 2022-09-07 ENCOUNTER — Encounter (HOSPITAL_COMMUNITY)
Admission: RE | Admit: 2022-09-07 | Discharge: 2022-09-07 | Disposition: A | Discharge status: Home / Self Care | Payer: Commercial Managed Care - HMO | Source: Ambulatory Visit | Attending: Internal Medicine | Admitting: Internal Medicine

## 2022-09-07 DIAGNOSIS — J449 Chronic obstructive pulmonary disease, unspecified: Secondary | ICD-10-CM | POA: Diagnosis present

## 2022-09-07 NOTE — Progress Notes (Signed)
Daily Session Note  Patient Details  Name: Jocelyn Myers MRN: [id number] Date of Birth: 1965-12-19 Referring Provider:   Doristine Devoid Pulmonary Rehab Walk Test from 08/18/2022 in [facility]  Referring Provider Young       Encounter Date: 09/07/2022  Check In:  Session Check In - 09/07/22 1543       Check-In   Supervising physician immediately available to respond to emergencies [facility] MD immediately available    Physician(s) Edd Fabian, NP    [facility]    Staff Present Samantha Belarus, RD, Dutch Gray, RN, BSN;Randi Reeve BS, ACSM-CEP, Exercise Physiologist;Kaylee Earlene Plater, MS, ACSM-CEP, Exercise Physiologist;David Manus Gunning, MS, ACSM-CEP, CCRP, Exercise Physiologist;Xaiden Fleig Katrinka Blazing, RT    Virtual Visit No    Medication changes reported     No    Fall or balance concerns reported    No    Tobacco Cessation No Change    Warm-up and Cool-down Performed as group-led instruction    Resistance Training Performed Yes    VAD Patient? No    PAD/SET Patient? No      Pain Assessment   Currently in Pain? No/denies    Multiple Pain Sites No             Capillary Blood Glucose: No results found for this or any previous visit (from the past 24 hour(s)).    Social History   Tobacco Use  Smoking Status Never  Smokeless Tobacco Never    Goals Met:  Proper associated with RPD/PD & O2 Sat Independence with exercise equipment Exercise tolerated well No report of concerns or symptoms today Strength training completed today  Goals Unmet:  Not Applicable  Comments: Service time is from 1318 to 1500.    Dr. Mechele Collin is Medical Director for Pulmonary Rehab at [facility]

## 2022-09-12 ENCOUNTER — Encounter (HOSPITAL_COMMUNITY): Payer: Commercial Managed Care - HMO

## 2022-09-12 ENCOUNTER — Other Ambulatory Visit (HOSPITAL_COMMUNITY): Payer: Self-pay

## 2022-09-14 ENCOUNTER — Telehealth (HOSPITAL_COMMUNITY): Payer: Self-pay

## 2022-09-14 ENCOUNTER — Encounter (HOSPITAL_COMMUNITY): Payer: Commercial Managed Care - HMO

## 2022-09-14 NOTE — Telephone Encounter (Signed)
Called to check on patient after missing Pulmonary Rehab for the last week. Pt stated she was taking exams all this week but will be back on Tuesday.

## 2022-09-15 ENCOUNTER — Other Ambulatory Visit (HOSPITAL_COMMUNITY): Payer: Self-pay

## 2022-09-18 ENCOUNTER — Other Ambulatory Visit (HOSPITAL_COMMUNITY): Payer: Self-pay

## 2022-09-19 ENCOUNTER — Encounter (HOSPITAL_COMMUNITY)
Admission: RE | Admit: 2022-09-19 | Discharge: 2022-09-19 | Disposition: A | Discharge status: Home / Self Care | Payer: Commercial Managed Care - HMO | Source: Ambulatory Visit | Attending: Internal Medicine | Admitting: Internal Medicine

## 2022-09-19 VITALS — Wt 209.0 lb

## 2022-09-19 DIAGNOSIS — J449 Chronic obstructive pulmonary disease, unspecified: Secondary | ICD-10-CM | POA: Diagnosis not present

## 2022-09-19 NOTE — Progress Notes (Signed)
Home Exercise Prescription I have reviewed a Home Exercise Prescription with [name]. She is currently walking at home 15-30 min 1-2 nonrehab days a week. Encouraged pt to continue and to add time as able, aiming for 30-60 min of walking given that she has weight loss goals as well. Pt agreed. The patient stated that their goals were to maintain her health and keep exercising. We reviewed exercise guidelines, target heart rate during exercise, RPE Scale, weather conditions, endpoints for exercise, warmup and cool down. The patient is encouraged to come to me with any questions. I will continue to follow up with the patient to assist them with progression and safety.  Spent 15 min discussing home exercise plan and goals.  [facility] [place], [place], ACSM-CEP 09/19/2022 3:21 PM

## 2022-09-19 NOTE — Progress Notes (Signed)
Daily Session Note  Patient Details  Name: Jocelyn Myers MRN: [id number] Date of Birth: August 06, 1965 Referring Provider:   Doristine Devoid Pulmonary Rehab Walk Test from 08/18/2022 in [facility]  Referring Provider Young       Encounter Date: 09/19/2022  Check In:  Session Check In - 09/19/22 1419       Check-In   Supervising physician immediately available to respond to emergencies [facility] MD immediately available    Physician(s) Jari Favre, PA    Location [facility]    Staff Present Samantha Belarus, RD, Dutch Gray, RN, BSN;Randi Reeve BS, ACSM-CEP, Exercise Physiologist;Kaylee Earlene Plater, MS, ACSM-CEP, Exercise Physiologist;David Manus Gunning, MS, ACSM-CEP, CCRP, Exercise Physiologist;Seab Axel Katrinka Blazing, RT    Virtual Visit No    Medication changes reported     No    Fall or balance concerns reported    No    Tobacco Cessation No Change    Warm-up and Cool-down Performed as group-led instruction    Resistance Training Performed Yes    VAD Patient? No    PAD/SET Patient? No      Pain Assessment   Currently in Pain? No/denies    Multiple Pain Sites No             Capillary Blood Glucose: No results found for this or any previous visit (from the past 24 hour(s)).   Exercise Prescription Changes - 09/19/22 1500       Response to Exercise   Blood Pressure (Admit) 92/62    Blood Pressure (Exercise) 128/70    Blood Pressure (Exit) 96/64    Heart Rate (Admit) 90 bpm    Heart Rate (Exercise) 107 bpm    Heart Rate (Exit) 80 bpm    Oxygen Saturation (Admit) 100 %    Oxygen Saturation (Exercise) 96 %    Oxygen Saturation (Exit) 97 %    Rating of Perceived Exertion (Exercise) 11    Perceived Dyspnea (Exercise) 1    Duration Continue with 30 min of aerobic exercise without signs/symptoms of physical distress.    Intensity THRR unchanged      Progression   Progression Continue to progress workloads to maintain  intensity without signs/symptoms of physical distress.      Resistance Training   Training Prescription Yes    Weight blue bands    Reps 10-15    Time 15 Minutes      Treadmill   MPH 2.5    Grade 1.5    Minutes 15    METs 3.43      Recumbant Elliptical   Level 3    Minutes 15    METs 3.9             Social History   Tobacco Use  Smoking Status Never  Smokeless Tobacco Never    Goals Met:  Proper associated with RPD/PD & O2 Sat Independence with exercise equipment Exercise tolerated well No report of concerns or symptoms today Strength training completed today  Goals Unmet:  Not Applicable  Comments: Service time is from 1323 to 1450.    Dr. Mechele Collin is Medical Director for Pulmonary Rehab at [facility].

## 2022-09-20 NOTE — Progress Notes (Signed)
Pulmonary Individual Treatment Plan  Patient Details  Name: Jocelyn Myers MRN: [id number] Date of Birth: 1965/10/19 Referring Provider:   Doristine Devoid Pulmonary Rehab Walk Test from 08/18/2022 in [facility]  Referring Provider Young       Initial Encounter Date:  Flowsheet Row Pulmonary Rehab Walk Test from 08/18/2022 in [facility]  Date 08/18/22       Visit Diagnosis: Stage 3 severe COPD by GOLD classification (HCC)  Patient's Home Medications on Admission:   Current Outpatient Medications:    albuterol (PROAIR HFA) 108 (90 BASE) MCG/ACT inhaler, Inhale 2 puffs into the lungs every 6 (six) hours as needed for wheezing or shortness of breath., Disp: 3 Inhaler, Rfl: 1   amoxicillin-clavulanate (AUGMENTIN) 875-125 MG tablet, Take 1 tablet by mouth 2 (two) times daily. (Patient not taking: Reported on 08/18/2022), Disp: 14 tablet, Rfl: 0   benzonatate (TESSALON) 100 MG capsule, TAKE 1 CAPSULE BY MOUTH EVERY 8 HOURS AS NEEDED FOR COUGH (Patient not taking: Reported on 08/18/2022), Disp: 40 capsule, Rfl: 0   Dupilumab (DUPIXENT) 300 MG/2ML SOPN, Inject 300 mg into the skin every 14 (fourteen) days., Disp: 4 mL, Rfl: 5   fluticasone (FLONASE) 50 MCG/ACT nasal spray, Place 2 sprays into both nostrils daily., Disp: 16 g, Rfl: 2   guaiFENesin (MUCINEX) 600 MG 12 hr tablet, Take 1 tablet (600 mg total) by mouth 2 (two) times daily. (Patient not taking: Reported on 08/18/2022), Disp: , Rfl:    ipratropium-albuterol (DUONEB) 0.5-2.5 (3) MG/3ML SOLN, Inhale 1 neb every 6 hours as needed, Disp: 75 mL, Rfl: 12   mometasone-formoterol (DULERA) 200-5 MCG/ACT AERO, Inhale 2 puffs into the lungs in the morning and at bedtime., Disp: 13 g, Rfl: 5   montelukast (SINGULAIR) 10 MG tablet, TAKE 1 TABLET BY MOUTH EVERYDAY AT BEDTIME, Disp: 90 tablet, Rfl: 1   Multiple Vitamin (MULTIVITAMIN)  tablet, Take 1 tablet by mouth daily., Disp: , Rfl:    promethazine-dextromethorphan (PROMETHAZINE-DM) 6.25-15 MG/5ML syrup, Take 5 mLs by mouth 4 (four) times daily as needed for cough., Disp: 118 mL, Rfl: 2  Past Medical History: Past Medical History:  Diagnosis Date   Asthma    COPD (chronic obstructive pulmonary disease) (HCC)     Tobacco Use: Social History   Tobacco Use  Smoking Status Never  Smokeless Tobacco Never    Labs: Review Flowsheet       Latest Ref Rng & Units 02/23/2014 06/23/2016 07/17/2018  Labs for ITP Cardiac and Pulmonary Rehab  Cholestrol 0 - 200 mg/dL 409  - 811   LDL (calc) 0 - 99 mg/dL 914  - 98   HDL-C >78.29 mg/dL 56.21  - 30.86   Trlycerides 0.0 - 149.0 mg/dL 57.8  - 46.9   Bicarbonate 20.0 - 28.0 mmol/L - 25.9  -  TCO2 0 - 100 mmol/L - 28  -  Acid-base deficit 0.0 - 2.0 mmol/L - 2.0  -  O2 Saturation % - 70.0  -    Capillary Blood Glucose: Lab Results  Component Value Date   GLUCAP 126 (H) 06/24/2016   GLUCAP 136 (H) 06/24/2016   GLUCAP 122 (H) 06/23/2016   GLUCAP 168 (H) 06/23/2016   GLUCAP 167 (H) 06/23/2016     Pulmonary Assessment Scores:  Pulmonary Assessment Scores     Row Name 08/18/22 1107         ADL UCSD  ADL Phase Entry     SOB Score total 23       CAT Score   CAT Score 25       mMRC Score   mMRC Score 1             UCSD: Self-administered rating of dyspnea associated with activities of daily living (ADLs) 6-point scale (0 = "not at all" to 5 = "maximal or unable to do because of breathlessness")  Scoring Scores range from 0 to 120.  Minimally important difference is 5 units  CAT: CAT can identify the health impairment of COPD patients and is better correlated with disease progression.  CAT has a scoring range of zero to 40. The CAT score is classified into four groups of low (less than 10), medium (10 - 20), high (21-30) and very high (31-40) based on the impact level of disease on health status. A CAT  score over 10 suggests significant symptoms.  A worsening CAT score could be explained by an exacerbation, poor medication adherence, poor inhaler technique, or progression of COPD or comorbid conditions.  CAT MCID is 2 points  mMRC: mMRC (Modified Medical Research Council) Dyspnea Scale is used to assess the degree of baseline functional disability in patients of respiratory disease due to dyspnea. No minimal important difference is established. A decrease in score of 1 point or greater is considered a positive change.   Pulmonary Function Assessment:  Pulmonary Function Assessment - 08/18/22 1158       Breath   Bilateral Breath Sounds Clear    Shortness of Breath No             Exercise Target Goals: Exercise Program Goal: Individual exercise prescription set using results from initial 6 min walk test and THRR while considering  patient's activity barriers and safety.   Exercise Prescription Goal: Initial exercise prescription builds to 30-45 minutes a day of aerobic activity, 2-3 days per week.  Home exercise guidelines will be given to patient during program as part of exercise prescription that the participant will acknowledge.  Activity Barriers & Risk Stratification:  Activity Barriers & Cardiac Risk Stratification - 08/18/22 1056       Activity Barriers & Cardiac Risk Stratification   Activity Barriers Deconditioning;Shortness of Breath;Muscular Weakness             6 Minute Walk:  6 Minute Walk     Row Name 08/18/22 1202         6 Minute Walk   Phase Initial     Distance 1220 feet     Walk Time 6 minutes     # of Rest Breaks 0     MPH 2.31     METS 3.06     RPE 11     Perceived Dyspnea  0     VO2 Peak 10.7     Symptoms No     Resting HR 96 bpm     Resting BP 98/60     Resting Oxygen Saturation  98 %     Exercise Oxygen Saturation  during 6 min walk 93 %     Max Ex. HR 126 bpm     Max Ex. BP 122/74     2 Minute Post BP 98/74       Interval HR    1 Minute HR 111     2 Minute HR 108     3 Minute HR 110     4 Minute HR 110  5 Minute HR 112     6 Minute HR 126     2 Minute Post HR 100     Interval Heart Rate? Yes       Interval Oxygen   Interval Oxygen? Yes     Baseline Oxygen Saturation % 98 %     1 Minute Oxygen Saturation % 98 %     1 Minute Liters of Oxygen 0 L     2 Minute Oxygen Saturation % 93 %     2 Minute Liters of Oxygen 0 L     3 Minute Oxygen Saturation % 96 %     3 Minute Liters of Oxygen 0 L     4 Minute Oxygen Saturation % 94 %     4 Minute Liters of Oxygen 0 L     5 Minute Oxygen Saturation % 96 %     5 Minute Liters of Oxygen 0 L     6 Minute Oxygen Saturation % 94 %     6 Minute Liters of Oxygen 0 L     2 Minute Post Oxygen Saturation % 100 %     2 Minute Post Liters of Oxygen 0 L              Oxygen Initial Assessment:  Oxygen Initial Assessment - 08/18/22 1100       Home Oxygen   Home Oxygen Device None    Sleep Oxygen Prescription None    Home Exercise Oxygen Prescription None    Home Resting Oxygen Prescription None      Initial 6 min Walk   Oxygen Used None      Program Oxygen Prescription   Program Oxygen Prescription None      Intervention   Short Term Goals To learn and demonstrate proper use of respiratory medications;To learn and understand importance of maintaining oxygen saturations>88%;To learn and understand importance of monitoring SPO2 with pulse oximeter and demonstrate accurate use of the pulse oximeter.;To learn and demonstrate proper pursed lip breathing techniques or other breathing techniques.     Long  Term Goals Maintenance of O2 saturations>88%;Compliance with respiratory medication;Verbalizes importance of monitoring SPO2 with pulse oximeter and return demonstration;Exhibits proper breathing techniques, such as pursed lip breathing or other method taught during program session;Demonstrates proper use of MDI's             Oxygen Re-Evaluation:  Oxygen  Re-Evaluation     Row Name 09/15/22 1009             Program Oxygen Prescription   Program Oxygen Prescription None         Home Oxygen   Home Oxygen Device None       Sleep Oxygen Prescription None       Home Exercise Oxygen Prescription None       Home Resting Oxygen Prescription None         Goals/Expected Outcomes   Short Term Goals To learn and demonstrate proper use of respiratory medications;To learn and understand importance of maintaining oxygen saturations>88%;To learn and understand importance of monitoring SPO2 with pulse oximeter and demonstrate accurate use of the pulse oximeter.;To learn and demonstrate proper pursed lip breathing techniques or other breathing techniques.        Long  Term Goals Maintenance of O2 saturations>88%;Compliance with respiratory medication;Verbalizes importance of monitoring SPO2 with pulse oximeter and return demonstration;Exhibits proper breathing techniques, such as pursed lip breathing or other method taught during program session;Demonstrates proper use  of MDI's       Goals/Expected Outcomes Compliance and understanding of oxygen saturations monitoring and breathing techniques to decrease shortness of breath.                Oxygen Discharge (Final Oxygen Re-Evaluation):  Oxygen Re-Evaluation - 09/15/22 1009       Program Oxygen Prescription   Program Oxygen Prescription None      Home Oxygen   Home Oxygen Device None    Sleep Oxygen Prescription None    Home Exercise Oxygen Prescription None    Home Resting Oxygen Prescription None      Goals/Expected Outcomes   Short Term Goals To learn and demonstrate proper use of respiratory medications;To learn and understand importance of maintaining oxygen saturations>88%;To learn and understand importance of monitoring SPO2 with pulse oximeter and demonstrate accurate use of the pulse oximeter.;To learn and demonstrate proper pursed lip breathing techniques or other breathing  techniques.     Long  Term Goals Maintenance of O2 saturations>88%;Compliance with respiratory medication;Verbalizes importance of monitoring SPO2 with pulse oximeter and return demonstration;Exhibits proper breathing techniques, such as pursed lip breathing or other method taught during program session;Demonstrates proper use of MDI's    Goals/Expected Outcomes Compliance and understanding of oxygen saturations monitoring and breathing techniques to decrease shortness of breath.             Initial Exercise Prescription:  Initial Exercise Prescription - 08/18/22 1200       Date of Initial Exercise RX and Referring Provider   Date 08/18/22    Referring Provider Young    Expected Discharge Date 11/16/22      Treadmill   MPH 2    Grade 0    Minutes 15      Recumbant Elliptical   Level 1    RPM 20    Watts 40    Minutes 15      Prescription Details   Frequency (times per week) 2    Duration Progress to 30 minutes of continuous aerobic without signs/symptoms of physical distress      Intensity   Ratings of Perceived Exertion 11-13    Perceived Dyspnea 0-4      Progression   Progression Continue to progress workloads to maintain intensity without signs/symptoms of physical distress.      Resistance Training   Training Prescription Yes    Weight blue bands    Reps 10-15             Perform Capillary Blood Glucose checks as needed.  Exercise Prescription Changes:   Exercise Prescription Changes     Row Name 08/31/22 1536 09/19/22 1500           Response to Exercise   Blood Pressure (Admit) 106/72 92/62      Blood Pressure (Exercise) -- 128/70      Blood Pressure (Exit) 104/70 96/64      Heart Rate (Admit) 84 bpm 90 bpm      Heart Rate (Exercise) 107 bpm 107 bpm      Heart Rate (Exit) 81 bpm 80 bpm      Oxygen Saturation (Admit) 99 % 100 %      Oxygen Saturation (Exercise) 100 % 96 %      Oxygen Saturation (Exit) 100 % 97 %      Rating of Perceived  Exertion (Exercise) 11 11      Perceived Dyspnea (Exercise) 1 1      Duration Continue with 30  min of aerobic exercise without signs/symptoms of physical distress. Continue with 30 min of aerobic exercise without signs/symptoms of physical distress.      Intensity THRR unchanged THRR unchanged        Progression   Progression Continue to progress workloads to maintain intensity without signs/symptoms of physical distress. Continue to progress workloads to maintain intensity without signs/symptoms of physical distress.        Resistance Training   Training Prescription Yes Yes      Weight blue bands blue bands      Reps 10-15 10-15      Time 15 Minutes 15 Minutes        Treadmill   MPH 2.5 2.5      Grade 1.5 1.5      Minutes 15 15      METs 3.43 3.43        Recumbant Elliptical   Level -- 3      Minutes 15 15      METs 4 3.9        Home Exercise Plan   Plans to continue exercise at -- Home (comment)  walking      Frequency -- Add 1 additional day to program exercise sessions.      Initial Home Exercises Provided -- 09/19/22               Exercise Comments:   Exercise Comments     Row Name 08/24/22 1643 09/19/22 1519         Exercise Comments Pt completed first day of exercise. Jocelyn Myers exercised for 15 min on the recumbent elliptical and treadmill. She averaged 2.8 METs at level 1 on the recumbent elliptical and 2.38 METs on the treadmill. She performed the warmup and cooldown standing without limitations. Jocelyn Myers tolerated her first day well. Discussed METs. Discussed with pt home exercise plan. She is currently walking at home 15-30 min 1-2 nonrehab days a week. Encouraged pt to continue and to add time as able, aiming for 30-60 min of walking given that she has weight loss goals as well. Pt agreed.               Exercise Goals and Review:   Exercise Goals     Row Name 08/18/22 1056 09/15/22 1006           Exercise Goals   Increase Physical Activity Yes Yes       Intervention Provide advice, education, support and counseling about physical activity/exercise needs.;Develop an individualized exercise prescription for aerobic and resistive training based on initial evaluation findings, risk stratification, comorbidities and participant's personal goals. Provide advice, education, support and counseling about physical activity/exercise needs.;Develop an individualized exercise prescription for aerobic and resistive training based on initial evaluation findings, risk stratification, comorbidities and participant's personal goals.      Expected Outcomes Short Term: Attend rehab on a regular basis to increase amount of physical activity.;Long Term: Exercising regularly at least 3-5 days a week.;Long Term: Add in home exercise to make exercise part of routine and to increase amount of physical activity. Short Term: Attend rehab on a regular basis to increase amount of physical activity.;Long Term: Exercising regularly at least 3-5 days a week.;Long Term: Add in home exercise to make exercise part of routine and to increase amount of physical activity.      Increase Strength and Stamina Yes Yes      Intervention Provide advice, education, support and counseling about physical activity/exercise needs.;Develop an individualized exercise  prescription for aerobic and resistive training based on initial evaluation findings, risk stratification, comorbidities and participant's personal goals. Provide advice, education, support and counseling about physical activity/exercise needs.;Develop an individualized exercise prescription for aerobic and resistive training based on initial evaluation findings, risk stratification, comorbidities and participant's personal goals.      Expected Outcomes Short Term: Increase workloads from initial exercise prescription for resistance, speed, and METs.;Short Term: Perform resistance training exercises routinely during rehab and add in resistance  training at home;Long Term: Improve cardiorespiratory fitness, muscular endurance and strength as measured by increased METs and functional capacity ( ) Short Term: Increase workloads from initial exercise prescription for resistance, speed, and METs.;Short Term: Perform resistance training exercises routinely during rehab and add in resistance training at home;Long Term: Improve cardiorespiratory fitness, muscular endurance and strength as measured by increased METs and functional capacity ( )      Able to understand and use rate of perceived exertion (RPE) scale Yes Yes      Intervention Provide education and explanation on how to use RPE scale Provide education and explanation on how to use RPE scale      Expected Outcomes Short Term: Able to use RPE daily in rehab to express subjective intensity level;Long Term:  Able to use RPE to guide intensity level when exercising independently Short Term: Able to use RPE daily in rehab to express subjective intensity level;Long Term:  Able to use RPE to guide intensity level when exercising independently      Able to understand and use Dyspnea scale Yes Yes      Intervention Provide education and explanation on how to use Dyspnea scale Provide education and explanation on how to use Dyspnea scale      Expected Outcomes Short Term: Able to use Dyspnea scale daily in rehab to express subjective sense of shortness of breath during exertion;Long Term: Able to use Dyspnea scale to guide intensity level when exercising independently Short Term: Able to use Dyspnea scale daily in rehab to express subjective sense of shortness of breath during exertion;Long Term: Able to use Dyspnea scale to guide intensity level when exercising independently      Knowledge and understanding of Target Heart Rate Range (THRR) Yes Yes      Intervention Provide education and explanation of THRR including how the numbers were predicted and where they are located for reference Provide  education and explanation of THRR including how the numbers were predicted and where they are located for reference      Expected Outcomes Short Term: Able to state/look up THRR;Long Term: Able to use THRR to govern intensity when exercising independently;Short Term: Able to use daily as guideline for intensity in rehab Short Term: Able to state/look up THRR;Long Term: Able to use THRR to govern intensity when exercising independently;Short Term: Able to use daily as guideline for intensity in rehab      Understanding of Exercise Prescription Yes Yes      Intervention Provide education, explanation, and written materials on patient's individual exercise prescription Provide education, explanation, and written materials on patient's individual exercise prescription      Expected Outcomes Short Term: Able to explain program exercise prescription;Long Term: Able to explain home exercise prescription to exercise independently Short Term: Able to explain program exercise prescription;Long Term: Able to explain home exercise prescription to exercise independently               Exercise Goals Re-Evaluation :  Exercise Goals Re-Evaluation     Row  Name 09/15/22 1006             Exercise Goal Re-Evaluation   Exercise Goals Review Increase Physical Activity;Able to understand and use Dyspnea scale;Understanding of Exercise Prescription;Increase Strength and Stamina;Knowledge and understanding of Target Heart Rate Range (THRR);Able to understand and use rate of perceived exertion (RPE) scale       Comments Pt has completed 4 days of exercise. She has missed the last 2 sessions due to prior engagements. Jocelyn Myers exercised for 15 min on the recumbent elliptical and treadmill. She averaged 3.8 METs at level 3 on the recumbent elliptical and 3.43 METs on the treadmill, 2.5 speed, 1.5 incline. She performed the warmup and cooldown standing without limitations. She is progressing and will monitor.       Expected  Outcomes Through exercise at rehab and at home, the patient will decrease shortness of breath with daily activities and feel confident in carrying out an exercise regime at home.                Discharge Exercise Prescription (Final Exercise Prescription Changes):  Exercise Prescription Changes - 09/19/22 1500       Response to Exercise   Blood Pressure (Admit) 92/62    Blood Pressure (Exercise) 128/70    Blood Pressure (Exit) 96/64    Heart Rate (Admit) 90 bpm    Heart Rate (Exercise) 107 bpm    Heart Rate (Exit) 80 bpm    Oxygen Saturation (Admit) 100 %    Oxygen Saturation (Exercise) 96 %    Oxygen Saturation (Exit) 97 %    Rating of Perceived Exertion (Exercise) 11    Perceived Dyspnea (Exercise) 1    Duration Continue with 30 min of aerobic exercise without signs/symptoms of physical distress.    Intensity THRR unchanged      Progression   Progression Continue to progress workloads to maintain intensity without signs/symptoms of physical distress.      Resistance Training   Training Prescription Yes    Weight blue bands    Reps 10-15    Time 15 Minutes      Treadmill   MPH 2.5    Grade 1.5    Minutes 15    METs 3.43      Recumbant Elliptical   Level 3    Minutes 15    METs 3.9      Home Exercise Plan   Plans to continue exercise at Home (comment)   walking   Frequency Add 1 additional day to program exercise sessions.    Initial Home Exercises Provided 09/19/22             Nutrition:  Target Goals: Understanding of nutrition guidelines, daily intake of sodium 1500mg , cholesterol 200mg , calories 30% from fat and 7% or less from saturated fats, daily to have 5 or more servings of fruits and vegetables.  Biometrics:  Pre Biometrics - 08/18/22 1206       Pre Biometrics   Grip Strength 20 kg              Nutrition Therapy Plan and Nutrition Goals:  Nutrition Therapy & Goals - 09/19/22 1439       Nutrition Therapy   Diet General Healthy  Diet      Personal Nutrition Goals   Nutrition Goal Patient to improve diet quality by using the plate method as a guide for meal planning to include lean protein/plant protein, fruits, vegetables, whole grains, nonfat dairy as part of a  well-balanced diet.    Comments Jocelyn Myers reports following a vegetarian diet for the last 5 years; her primary protein sources are beans/legumes and Ensure/Boost. She has maintained her weight since starting with our program. Jocelyn Myers will benefit from participation in pulmonary rehab for nutrition,exercise,and lifestyle modification.      Intervention Plan   Intervention Prescribe, educate and counsel regarding individualized specific dietary modifications aiming towards targeted core components such as weight, hypertension, lipid management, diabetes, heart failure and other comorbidities.;Nutrition handout(s) given to patient.    Expected Outcomes Short Term Goal: Understand basic principles of dietary content, such as calories, fat, sodium, cholesterol and nutrients.;Long Term Goal: Adherence to prescribed nutrition plan.             Nutrition Assessments:  Nutrition Assessments - 08/24/22 1536       Rate Your Plate Scores   Pre Score 59            MEDIFICTS Score Key: ?70 Need to make dietary changes  40-70 Heart Healthy Diet ? 40 Therapeutic Level Cholesterol Diet  Flowsheet Row PULMONARY REHAB CHRONIC OBSTRUCTIVE PULMONARY DISEASE from 08/24/2022 in [facility]  Picture Your Plate Total Score on Admission 59      Picture Your Plate Scores: <41 Unhealthy dietary pattern with much room for improvement. 41-50 Dietary pattern unlikely to meet recommendations for good health and room for improvement. 51-60 More healthful dietary pattern, with some room for improvement.  >60 Healthy dietary pattern, although there may be some specific behaviors that could be improved.    Nutrition Goals  Re-Evaluation:  Nutrition Goals Re-Evaluation     Row Name 08/24/22 1501 09/19/22 1439           Goals   Current Weight 208 lb 15.9 oz (94.8 kg) 208 lb 15.9 oz (94.8 kg)      Comment Lipid panel (2020) WNL, no new labs.      Expected Outcome Jocelyn Myers reports following a vegetarian diet for the last 5 years; her primary protein sources are beans/legumes and Ensure/Boost. Ambre will benefit from participation in pulmonary rehab for nutrition,exercise,and lifestyle modification. Jocelyn Myers reports following a vegetarian diet for the last 5 years; her primary protein sources are beans/legumes and Ensure/Boost. She has maintained her weight since starting with our program. Jocelyn Myers will benefit from participation in pulmonary rehab for nutrition,exercise,and lifestyle modification.               Nutrition Goals Discharge (Final Nutrition Goals Re-Evaluation):  Nutrition Goals Re-Evaluation - 09/19/22 1439       Goals   Current Weight 208 lb 15.9 oz (94.8 kg)    Comment no new labs.    Expected Outcome Jocelyn Myers reports following a vegetarian diet for the last 5 years; her primary protein sources are beans/legumes and Ensure/Boost. She has maintained her weight since starting with our program. Jocelyn Myers will benefit from participation in pulmonary rehab for nutrition,exercise,and lifestyle modification.             Psychosocial: Target Goals: Acknowledge presence or absence of significant depression and/or stress, maximize coping skills, provide positive support system. Participant is able to verbalize types and ability to use techniques and skills needed for reducing stress and depression.  Initial Review & Psychosocial Screening:  Initial Psych Review & Screening - 08/18/22 1052       Initial Review   Current issues with None Identified      Family Dynamics   Good  Support System? Yes      Barriers   Psychosocial barriers to participate in program There are no identifiable barriers or psychosocial  needs.      Screening Interventions   Interventions Encouraged to exercise             Quality of Life Scores:  Scores of 19 and below usually indicate a poorer quality of life in these areas.  A difference of  2-3 points is a clinically meaningful difference.  A difference of 2-3 points in the total score of the Quality of Life Index has been associated with significant improvement in overall quality of life, self-image, physical symptoms, and general health in studies assessing change in quality of life.  PHQ-9: Review Flowsheet       08/18/2022 06/02/2019 07/05/2018  Depression screen PHQ 2/9  Decreased Interest 0 0 0  Down, Depressed, Hopeless 0 0 0  PHQ - 2 Score 0 0 0  Altered sleeping 1 - -  Tired, decreased energy 0 - -  Change in appetite 2 - -  Feeling bad or failure about yourself  0 - -  Trouble concentrating 0 - -  Moving slowly or fidgety/restless 0 - -  Suicidal thoughts 0 - -  PHQ-9 Score 3 - -  Difficult doing work/chores Somewhat difficult - -   Interpretation of Total Score  Total Score Depression Severity:  1-4 = Minimal depression, 5-9 = Mild depression, 10-14 = Moderate depression, 15-19 = Moderately severe depression, 20-27 = Severe depression   Psychosocial Evaluation and Intervention:  Psychosocial Evaluation - 08/18/22 1053       Psychosocial Evaluation & Interventions   Interventions Encouraged to exercise with the program and follow exercise prescription    Comments Pt denies any psychosocial barriers or concerns at this time    Expected Outcomes For pt to participate in PR free of any psychosocial barriers or concerns    Continue Psychosocial Services  No Follow up required             Psychosocial Re-Evaluation:  Psychosocial Re-Evaluation     Row Name 09/13/22 1404             Psychosocial Re-Evaluation   Current issues with None Identified       Comments Jocelyn Myers denies any psychosocial barriers or concerns at this time.        Expected Outcomes For Jocelyn Myers to participate in PR free of any psychosocial barriers or concerns.       Interventions Encouraged to attend Pulmonary Rehabilitation for the exercise       Continue Psychosocial Services  No Follow up required                Psychosocial Discharge (Final Psychosocial Re-Evaluation):  Psychosocial Re-Evaluation - 09/13/22 1404       Psychosocial Re-Evaluation   Current issues with None Identified    Comments Jocelyn Myers denies any psychosocial barriers or concerns at this time.    Expected Outcomes For Jocelyn Myers to participate in PR free of any psychosocial barriers or concerns.    Interventions Encouraged to attend Pulmonary Rehabilitation for the exercise    Continue Psychosocial Services  No Follow up required             Education: Education Goals: Education classes will be provided on a weekly basis, covering required topics. Participant will state understanding/return demonstration of topics presented.  Learning Barriers/Preferences:  Learning Barriers/Preferences - 08/18/22 1054       Learning  Barriers/Preferences   Learning Barriers None    Learning Preferences None             Education Topics: Introduction to Pulmonary Rehab Group instruction provided by PowerPoint, verbal discussion, and written material to support subject matter. Instructor reviews what Pulmonary Rehab is, the purpose of the program, and how patients are referred.     Know Your Numbers Group instruction that is supported by a PowerPoint presentation. Instructor discusses importance of knowing and understanding resting, exercise, and post-exercise oxygen saturation, heart rate, and blood pressure. Oxygen saturation, heart rate, blood pressure, rating of perceived exertion, and dyspnea are reviewed along with a normal range for these values.    Exercise for the Pulmonary Patient Group instruction that is supported by a PowerPoint presentation. Instructor discusses  benefits of exercise, core components of exercise, frequency, duration, and intensity of an exercise routine, importance of utilizing pulse oximetry during exercise, safety while exercising, and options of places to exercise outside of rehab.       MET Level  Group instruction provided by PowerPoint, verbal discussion, and written material to support subject matter. Instructor reviews what METs are and how to increase METs.  Flowsheet Row PULMONARY REHAB CHRONIC OBSTRUCTIVE PULMONARY DISEASE from 09/07/2022 in [facility]  Date 09/07/22  Educator EP  Instruction Review Code 1- Verbalizes Understanding       Pulmonary Medications Verbally interactive group education provided by instructor with focus on inhaled medications and proper administration.   Anatomy and Physiology of the Respiratory System Group instruction provided by PowerPoint, verbal discussion, and written material to support subject matter. Instructor reviews respiratory cycle and anatomical components of the respiratory system and their functions. Instructor also reviews differences in obstructive and restrictive respiratory diseases with examples of each.    Oxygen Safety Group instruction provided by PowerPoint, verbal discussion, and written material to support subject matter. There is an overview of "What is Oxygen" and "Why do we need it".  Instructor also reviews how to create a safe environment for oxygen use, the importance of using oxygen as prescribed, and the risks of noncompliance. There is a brief discussion on traveling with oxygen and resources the patient may utilize. Flowsheet Row PULMONARY REHAB CHRONIC OBSTRUCTIVE PULMONARY DISEASE from 08/24/2022 in [facility]  Date 08/24/22  Educator RN  Instruction Review Code 1- Verbalizes Understanding       Oxygen Use Group instruction provided  by PowerPoint, verbal discussion, and written material to discuss how supplemental oxygen is prescribed and different types of oxygen supply systems. Resources for more information are provided.  Flowsheet Row PULMONARY REHAB CHRONIC OBSTRUCTIVE PULMONARY DISEASE from 08/31/2022 in [facility]  Date 08/31/22  Educator RT  Instruction Review Code 1- Verbalizes Understanding       Breathing Techniques Group instruction that is supported by demonstration and informational handouts. Instructor discusses the benefits of pursed lip and diaphragmatic breathing and detailed demonstration on how to perform both.     Risk Factor Reduction Group instruction that is supported by a PowerPoint presentation. Instructor discusses the definition of a risk factor, different risk factors for pulmonary disease, and how the heart and lungs work together.   MD Day A group question and answer session with a medical doctor that allows participants to ask questions that relate to their pulmonary disease state.   Nutrition  for the Pulmonary Patient Group instruction provided by PowerPoint slides, verbal discussion, and written materials to support subject matter. The instructor gives an explanation and review of healthy diet recommendations, which includes a discussion on weight management, recommendations for fruit and vegetable consumption, as well as protein, fluid, caffeine, fiber, sodium, sugar, and alcohol. Tips for eating when patients are short of breath are discussed.    Other Education Group or individual verbal, written, or video instructions that support the educational goals of the pulmonary rehab program.    Knowledge Questionnaire Score:  Knowledge Questionnaire Score - 08/18/22 1110       Knowledge Questionnaire Score   Pre Score 16/18             Core Components/Risk Factors/Patient Goals at Admission:  Personal Goals and  Risk Factors at Admission - 08/18/22 1055       Core Components/Risk Factors/Patient Goals on Admission    Weight Management Weight Loss    Improve shortness of breath with ADL's Yes    Intervention Provide education, individualized exercise plan and daily activity instruction to help decrease symptoms of SOB with activities of daily living.    Expected Outcomes Short Term: Improve cardiorespiratory fitness to achieve a reduction of symptoms when performing ADLs;Long Term: Be able to perform more ADLs without symptoms or delay the onset of symptoms    Increase knowledge of respiratory medications and ability to use respiratory devices properly  Yes    Intervention Provide education and demonstration as needed of appropriate use of medications, inhalers, and oxygen therapy.    Expected Outcomes Short Term: Achieves understanding of medications use. Understands that oxygen is a medication prescribed by physician. Demonstrates appropriate use of inhaler and oxygen therapy.;Long Term: Maintain appropriate use of medications, inhalers, and oxygen therapy.             Core Components/Risk Factors/Patient Goals Review:   Goals and Risk Factor Review     Row Name 09/13/22 1407             Core Components/Risk Factors/Patient Goals Review   Personal Goals Review Weight Management/Obesity;Improve shortness of breath with ADL's;Develop more efficient breathing techniques such as purse lipped breathing and diaphragmatic breathing and practicing self-pacing with activity.;Increase knowledge of respiratory medications and ability to use respiratory devices properly.       Review Shia is doing well in PR class. She enjoy's coming to class and exercising. 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 Chanell has not met her weight loss goals, but is working with staff dietician to obtain her goal. She uses purse lip breathing when she becomes SOB.  Camiah is able to report her rate of perceived exertion and dyspnea scale when asked by staff. Nyellie is knowledgeable about her respiratory medications. We will continue to monitor Jocelyn Myers's progress throughout the program.       Expected Outcomes See admission goals                Core Components/Risk Factors/Patient Goals at Discharge (Final Review):   Goals and Risk Factor Review - 09/13/22 1407       Core Components/Risk Factors/Patient Goals Review   Personal Goals Review Weight Management/Obesity;Improve shortness of breath with ADL's;Develop more efficient breathing techniques such as purse lipped breathing and diaphragmatic breathing and practicing self-pacing with activity.;Increase knowledge of respiratory medications and ability to use respiratory devices properly.    Review Irah is doing  well in PR class. She enjoy's coming to class and exercising. 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 Rozlyn has not met her weight loss goals, but is working with staff dietician to obtain her goal. She uses purse lip breathing when she becomes SOB. Zura is able to report her rate of perceived exertion and dyspnea scale when asked by staff. Aaliana is knowledgeable about her respiratory medications. We will continue to monitor Kirbi's progress throughout the program.    Expected Outcomes See admission goals             ITP Comments:Pt is making expected progress toward Pulmonary Rehab goals after completing 5 sessions. Recommend continued exercise, life style modification, education, and utilization of breathing techniques to increase stamina and strength, while also decreasing shortness of breath with exertion.  Dr. Mechele Collin is Medical Director for Pulmonary Rehab at [facility]     Comments: Dr. Mechele Collin is Medical Director for Pulmonary Rehab at [facility].

## 2022-09-21 ENCOUNTER — Encounter (HOSPITAL_COMMUNITY)
Admission: RE | Admit: 2022-09-21 | Discharge: 2022-09-21 | Disposition: A | Discharge status: Home / Self Care | Payer: Commercial Managed Care - HMO | Source: Ambulatory Visit | Attending: Internal Medicine | Admitting: Internal Medicine

## 2022-09-21 DIAGNOSIS — J449 Chronic obstructive pulmonary disease, unspecified: Secondary | ICD-10-CM | POA: Diagnosis not present

## 2022-09-21 NOTE — Progress Notes (Signed)
Daily Session Note  Patient Details  Name: Jocelyn Myers MRN: [id number] Date of Birth: 10-17-1965 Referring Provider:   Doristine Devoid Pulmonary Rehab Walk Test from 08/18/2022 in [facility]  Referring Provider Young       Encounter Date: 09/21/2022  Check In:  Session Check In - 09/21/22 1449       Check-In   Supervising physician immediately available to respond to emergencies [facility] MD immediately available    Physician(s) Christy Sartorius, NP    Location [facility]    Staff Present Samantha Belarus, RD, Dutch Gray, RN, BSN;Randi Reeve BS, ACSM-CEP, Exercise Physiologist;Kaylee Earlene Plater, MS, ACSM-CEP, Exercise Physiologist;David Manus Gunning, MS, ACSM-CEP, CCRP, Exercise Physiologist;Sione Baumgarten Katrinka Blazing, RT;Jetta Walker BS, ACSM-CEP, Exercise Physiologist    Virtual Visit No    Medication changes reported     No    Fall or balance concerns reported    No    Tobacco Cessation No Change    Warm-up and Cool-down Performed as group-led instruction    Resistance Training Performed Yes    VAD Patient? No    PAD/SET Patient? No      Pain Assessment   Currently in Pain? No/denies    Multiple Pain Sites No             Capillary Blood Glucose: No results found for this or any previous visit (from the past 24 hour(s)).    Social History   Tobacco Use  Smoking Status Never  Smokeless Tobacco Never    Goals Met:  Proper associated with RPD/PD & O2 Sat Independence with exercise equipment Exercise tolerated well No report of concerns or symptoms today Strength training completed today  Goals Unmet:  Not Applicable  Comments: Service time is from 1318 to 1457.    Dr. Mechele Collin is Medical Director for Pulmonary Rehab at [facility].

## 2022-09-26 ENCOUNTER — Encounter (HOSPITAL_COMMUNITY)
Admission: RE | Admit: 2022-09-26 | Discharge: 2022-09-26 | Disposition: A | Discharge status: Home / Self Care | Payer: Commercial Managed Care - HMO | Source: Ambulatory Visit | Attending: Internal Medicine | Admitting: Internal Medicine

## 2022-09-26 DIAGNOSIS — J449 Chronic obstructive pulmonary disease, unspecified: Secondary | ICD-10-CM | POA: Diagnosis not present

## 2022-09-28 ENCOUNTER — Encounter (HOSPITAL_COMMUNITY): Payer: Commercial Managed Care - HMO

## 2022-10-03 ENCOUNTER — Telehealth: Payer: Self-pay | Admitting: Internal Medicine

## 2022-10-03 ENCOUNTER — Encounter (HOSPITAL_COMMUNITY): Payer: Commercial Managed Care - HMO

## 2022-10-03 NOTE — Telephone Encounter (Signed)
Pt should have available refills 

## 2022-10-04 MED ORDER — PROMETHAZINE-DM 6.25-15 MG/5ML PO SYRP: ORAL_SOLUTION | ORAL | 12 refills | Status: DC

## 2022-10-04 NOTE — Telephone Encounter (Signed)
Cough syrup refilled 

## 2022-10-04 NOTE — Telephone Encounter (Signed)
PROMETHAZINE-DM - Pt needs a refill for cough syrup, says she was only prescribed 2 & she's already ran out.  [facility] on [address]

## 2022-10-05 ENCOUNTER — Encounter (HOSPITAL_COMMUNITY): Payer: Commercial Managed Care - HMO

## 2022-10-05 NOTE — Telephone Encounter (Signed)
Spoke with patient. Advised cough syrup has been filled. NFN

## 2022-10-06 ENCOUNTER — Telehealth (HOSPITAL_COMMUNITY): Payer: Self-pay

## 2022-10-06 NOTE — Telephone Encounter (Signed)
LVM to check on patient after missing sessions at [facility]. Awaiting call back.

## 2022-10-09 ENCOUNTER — Other Ambulatory Visit (HOSPITAL_COMMUNITY): Payer: Self-pay

## 2022-10-10 ENCOUNTER — Encounter (HOSPITAL_COMMUNITY)
Admission: RE | Admit: 2022-10-10 | Discharge: 2022-10-10 | Disposition: A | Discharge status: Home / Self Care | Payer: Commercial Managed Care - HMO | Source: Ambulatory Visit | Attending: Internal Medicine | Admitting: Internal Medicine

## 2022-10-10 DIAGNOSIS — J449 Chronic obstructive pulmonary disease, unspecified: Secondary | ICD-10-CM | POA: Insufficient documentation

## 2022-10-10 NOTE — Progress Notes (Signed)
Daily Session Note  Patient Details  Name: Jocelyn Myers MRN: [id number] Date of Birth: 06-19-1965 Referring Provider:   Doristine Devoid Pulmonary Rehab Walk Test from 08/18/2022 in [facility], [facility]  Referring Provider Young       Encounter Date: 10/10/2022  Check In:  Session Check In - 10/10/22 1423       Check-In   Supervising physician immediately available to respond to emergencies [facility] MD immediately available    Physician(s) Carlyon Shadow, NP    Staff Present Samantha Belarus, RD, LDN;Randi Dionisio Paschal, ACSM-CEP, Exercise Physiologist;Kaylee Earlene Plater, MS, ACSM-CEP, Exercise Physiologist;David Manus Gunning, MS, ACSM-CEP, CCRP, Exercise Physiologist;Baylie Drakes Katrinka Blazing, RT    Virtual Visit No    Medication changes reported     No    Fall or balance concerns reported    No    Tobacco Cessation No Change    Warm-up and Cool-down Performed as group-led instruction    Resistance Training Performed Yes    VAD Patient? No    PAD/SET Patient? No      Pain Assessment   Currently in Pain? No/denies    Multiple Pain Sites No             Capillary Blood Glucose: No results found for this or any previous visit (from the past 24 hour(s)).    Social History   Tobacco Use  Smoking Status Never  Smokeless Tobacco Never    Goals Met:  Proper associated with RPD/PD & O2 Sat Independence with exercise equipment Exercise tolerated well No report of concerns or symptoms today Strength training completed today  Goals Unmet:  Not Applicable  Comments: Service time is from 1325 to 1436.    Dr. Mechele Collin is Medical Director for Pulmonary Rehab at [facility].

## 2022-10-12 ENCOUNTER — Other Ambulatory Visit (HOSPITAL_COMMUNITY): Payer: Self-pay

## 2022-10-12 ENCOUNTER — Encounter (HOSPITAL_COMMUNITY)
Admission: RE | Admit: 2022-10-12 | Discharge: 2022-10-12 | Disposition: A | Discharge status: Home / Self Care | Payer: Commercial Managed Care - HMO | Source: Ambulatory Visit | Attending: Internal Medicine | Admitting: Internal Medicine

## 2022-10-12 VITALS — Wt 210.5 lb

## 2022-10-12 DIAGNOSIS — J449 Chronic obstructive pulmonary disease, unspecified: Secondary | ICD-10-CM

## 2022-10-12 NOTE — Progress Notes (Signed)
Daily Session Note  Patient Details  Name: Jocelyn Myers MRN: [id number] Date of Birth: 1965/12/11 Referring Provider:   Doristine Devoid Pulmonary Rehab Walk Test from 08/18/2022 in [facility]  Referring Provider Young       Encounter Date: 10/12/2022  Check In:  Session Check In - 10/12/22 1432       Check-In   Supervising physician immediately available to respond to emergencies [facility] MD immediately available    Physician(s) Jari Favre, PA    Location [facility]    Staff Present Valinda Party, MS, Exercise Physiologist;Samantha Belarus, RD, LDN;Herchel Hopkin Dionisio Paschal, ACSM-CEP, Exercise Physiologist;Kaylee Earlene Plater, MS, ACSM-CEP, Exercise Physiologist;Casey Katrinka Blazing, RT    Virtual Visit No    Medication changes reported     No    Fall or balance concerns reported    No    Tobacco Cessation No Change    Warm-up and Cool-down Performed as group-led instruction    Resistance Training Performed Yes    VAD Patient? No    PAD/SET Patient? No      Pain Assessment   Currently in Pain? No/denies    Multiple Pain Sites No             Capillary Blood Glucose: No results found for this or any previous visit (from the past 24 hour(s)).    Social History   Tobacco Use  Smoking Status Never  Smokeless Tobacco Never    Goals Met:  Independence with exercise equipment Exercise tolerated well No report of concerns or symptoms today Strength training completed today  Goals Unmet:  Not Applicable  Comments: Service time is from 1319 to 1444.    Dr. Mechele Collin is Medical Director for Pulmonary Rehab at [facility].

## 2022-10-16 ENCOUNTER — Other Ambulatory Visit (HOSPITAL_COMMUNITY): Payer: Self-pay

## 2022-10-17 ENCOUNTER — Encounter (HOSPITAL_COMMUNITY): Payer: Commercial Managed Care - HMO

## 2022-10-18 NOTE — Progress Notes (Signed)
Pulmonary Individual Treatment Plan  Patient Details  Name: Jocelyn Myers MRN: [id number] Date of Birth: May 31, 1965 Referring Provider:   Doristine Devoid Pulmonary Rehab Walk Test from 08/18/2022 in [facility]  Referring Provider Young       Initial Encounter Date:  Flowsheet Row Pulmonary Rehab Walk Test from 08/18/2022 in [facility]  Date 08/18/22       Visit Diagnosis: Stage 3 severe COPD by GOLD classification (HCC)  Patient's Home Medications on Admission:   Current Outpatient Medications:    albuterol (PROAIR HFA) 108 (90 BASE) MCG/ACT inhaler, Inhale 2 puffs into the lungs every 6 (six) hours as needed for wheezing or shortness of breath., Disp: 3 Inhaler, Rfl: 1   amoxicillin-clavulanate (AUGMENTIN) 875-125 MG tablet, Take 1 tablet by mouth 2 (two) times daily. (Patient not taking: Reported on 08/18/2022), Disp: 14 tablet, Rfl: 0   benzonatate (TESSALON) 100 MG capsule, TAKE 1 CAPSULE BY MOUTH EVERY 8 HOURS AS NEEDED FOR COUGH (Patient not taking: Reported on 08/18/2022), Disp: 40 capsule, Rfl: 0   Dupilumab (DUPIXENT) 300 MG/2ML SOPN, Inject 300 mg into the skin every 14 (fourteen) days., Disp: 4 mL, Rfl: 5   fluticasone (FLONASE) 50 MCG/ACT nasal spray, Place 2 sprays into both nostrils daily., Disp: 16 g, Rfl: 2   guaiFENesin (MUCINEX) 600 MG 12 hr tablet, Take 1 tablet (600 mg total) by mouth 2 (two) times daily. (Patient not taking: Reported on 08/18/2022), Disp: , Rfl:    ipratropium-albuterol (DUONEB) 0.5-2.5 (3) MG/3ML SOLN, Inhale 1 neb every 6 hours as needed, Disp: 75 mL, Rfl: 12   mometasone-formoterol (DULERA) 200-5 MCG/ACT AERO, Inhale 2 puffs into the lungs in the morning and at bedtime., Disp: 13 g, Rfl: 5   montelukast (SINGULAIR) 10 MG tablet, TAKE 1 TABLET BY MOUTH EVERYDAY AT BEDTIME, Disp: 90 tablet, Rfl: 1   Multiple Vitamin (MULTIVITAMIN)  tablet, Take 1 tablet by mouth daily., Disp: , Rfl:    promethazine-dextromethorphan (PROMETHAZINE-DM) 6.25-15 MG/5ML syrup, 5 ml ever 8 hours if needed for cough, Disp: 240 mL, Rfl: 12  Past Medical History: Past Medical History:  Diagnosis Date   Asthma    COPD (chronic obstructive pulmonary disease) (HCC)     Tobacco Use: Social History   Tobacco Use  Smoking Status Never  Smokeless Tobacco Never    Labs: Review Flowsheet       Latest Ref Rng & Units 02/23/2014 06/23/2016 07/17/2018  Labs for ITP Cardiac and Pulmonary Rehab  Cholestrol 0 - 200 mg/dL 132  - 440   LDL (calc) 0 - 99 mg/dL 102  - 98   HDL-C >72.53 mg/dL 66.44  - 03.47   Trlycerides 0.0 - 149.0 mg/dL 42.5  - 95.6   Bicarbonate 20.0 - 28.0 mmol/L - 25.9  -  TCO2 0 - 100 mmol/L - 28  -  Acid-base deficit 0.0 - 2.0 mmol/L - 2.0  -  O2 Saturation % - 70.0  -    Capillary Blood Glucose: Lab Results  Component Value Date   GLUCAP 126 (H) 06/24/2016   GLUCAP 136 (H) 06/24/2016   GLUCAP 122 (H) 06/23/2016   GLUCAP 168 (H) 06/23/2016   GLUCAP 167 (H) 06/23/2016     Pulmonary Assessment Scores:  Pulmonary Assessment Scores     Row Name 08/18/22 1107         ADL UCSD   ADL Phase  Entry     SOB Score total 23       CAT Score   CAT Score 25       mMRC Score   mMRC Score 1             UCSD: Self-administered rating of dyspnea associated with activities of daily living (ADLs) 6-point scale (0 = "not at all" to 5 = "maximal or unable to do because of breathlessness")  Scoring Scores range from 0 to 120.  Minimally important difference is 5 units  CAT: CAT can identify the health impairment of COPD patients and is better correlated with disease progression.  CAT has a scoring range of zero to 40. The CAT score is classified into four groups of low (less than 10), medium (10 - 20), high (21-30) and very high (31-40) based on the impact level of disease on health status. A CAT score over 10 suggests  significant symptoms.  A worsening CAT score could be explained by an exacerbation, poor medication adherence, poor inhaler technique, or progression of COPD or comorbid conditions.  CAT MCID is 2 points  mMRC: mMRC (Modified Medical Research Council) Dyspnea Scale is used to assess the degree of baseline functional disability in patients of respiratory disease due to dyspnea. No minimal important difference is established. A decrease in score of 1 point or greater is considered a positive change.   Pulmonary Function Assessment:  Pulmonary Function Assessment - 08/18/22 1158       Breath   Bilateral Breath Sounds Clear    Shortness of Breath No             Exercise Target Goals: Exercise Program Goal: Individual exercise prescription set using results from initial 6 min walk test and THRR while considering  patient's activity barriers and safety.   Exercise Prescription Goal: Initial exercise prescription builds to 30-45 minutes a day of aerobic activity, 2-3 days per week.  Home exercise guidelines will be given to patient during program as part of exercise prescription that the participant will acknowledge.  Activity Barriers & Risk Stratification:  Activity Barriers & Cardiac Risk Stratification - 08/18/22 1056       Activity Barriers & Cardiac Risk Stratification   Activity Barriers Deconditioning;Shortness of Breath;Muscular Weakness             6 Minute Walk:  6 Minute Walk     Row Name 08/18/22 1202         6 Minute Walk   Phase Initial     Distance 1220 feet     Walk Time 6 minutes     # of Rest Breaks 0     MPH 2.31     METS 3.06     RPE 11     Perceived Dyspnea  0     VO2 Peak 10.7     Symptoms No     Resting HR 96 bpm     Resting BP 98/60     Resting Oxygen Saturation  98 %     Exercise Oxygen Saturation  during 6 min walk 93 %     Max Ex. HR 126 bpm     Max Ex. BP 122/74     2 Minute Post BP 98/74       Interval HR   1 Minute HR 111      2 Minute HR 108     3 Minute HR 110     4 Minute HR 110  5 Minute HR 112     6 Minute HR 126     2 Minute Post HR 100     Interval Heart Rate? Yes       Interval Oxygen   Interval Oxygen? Yes     Baseline Oxygen Saturation % 98 %     1 Minute Oxygen Saturation % 98 %     1 Minute Liters of Oxygen 0 L     2 Minute Oxygen Saturation % 93 %     2 Minute Liters of Oxygen 0 L     3 Minute Oxygen Saturation % 96 %     3 Minute Liters of Oxygen 0 L     4 Minute Oxygen Saturation % 94 %     4 Minute Liters of Oxygen 0 L     5 Minute Oxygen Saturation % 96 %     5 Minute Liters of Oxygen 0 L     6 Minute Oxygen Saturation % 94 %     6 Minute Liters of Oxygen 0 L     2 Minute Post Oxygen Saturation % 100 %     2 Minute Post Liters of Oxygen 0 L              Oxygen Initial Assessment:  Oxygen Initial Assessment - 08/18/22 1100       Home Oxygen   Home Oxygen Device None    Sleep Oxygen Prescription None    Home Exercise Oxygen Prescription None    Home Resting Oxygen Prescription None      Initial 6 min Walk   Oxygen Used None      Program Oxygen Prescription   Program Oxygen Prescription None      Intervention   Short Term Goals To learn and demonstrate proper use of respiratory medications;To learn and understand importance of maintaining oxygen saturations>88%;To learn and understand importance of monitoring SPO2 with pulse oximeter and demonstrate accurate use of the pulse oximeter.;To learn and demonstrate proper pursed lip breathing techniques or other breathing techniques.     Long  Term Goals Maintenance of O2 saturations>88%;Compliance with respiratory medication;Verbalizes importance of monitoring SPO2 with pulse oximeter and return demonstration;Exhibits proper breathing techniques, such as pursed lip breathing or other method taught during program session;Demonstrates proper use of MDI's             Oxygen Re-Evaluation:  Oxygen Re-Evaluation      Row Name 09/15/22 1009 10/11/22 1524           Program Oxygen Prescription   Program Oxygen Prescription None None        Home Oxygen   Home Oxygen Device None None      Sleep Oxygen Prescription None None      Home Exercise Oxygen Prescription None None      Home Resting Oxygen Prescription None None        Goals/Expected Outcomes   Short Term Goals To learn and demonstrate proper use of respiratory medications;To learn and understand importance of maintaining oxygen saturations>88%;To learn and understand importance of monitoring SPO2 with pulse oximeter and demonstrate accurate use of the pulse oximeter.;To learn and demonstrate proper pursed lip breathing techniques or other breathing techniques.  To learn and demonstrate proper use of respiratory medications;To learn and understand importance of maintaining oxygen saturations>88%;To learn and understand importance of monitoring SPO2 with pulse oximeter and demonstrate accurate use of the pulse oximeter.;To learn and demonstrate proper pursed lip breathing techniques or  other breathing techniques.       Long  Term Goals Maintenance of O2 saturations>88%;Compliance with respiratory medication;Verbalizes importance of monitoring SPO2 with pulse oximeter and return demonstration;Exhibits proper breathing techniques, such as pursed lip breathing or other method taught during program session;Demonstrates proper use of MDI's Maintenance of O2 saturations>88%;Compliance with respiratory medication;Verbalizes importance of monitoring SPO2 with pulse oximeter and return demonstration;Exhibits proper breathing techniques, such as pursed lip breathing or other method taught during program session;Demonstrates proper use of MDI's      Goals/Expected Outcomes Compliance and understanding of oxygen saturations monitoring and breathing techniques to decrease shortness of breath. Compliance and understanding of oxygen saturations monitoring and breathing  techniques to decrease shortness of breath.               Oxygen Discharge (Final Oxygen Re-Evaluation):  Oxygen Re-Evaluation - 10/11/22 1524       Program Oxygen Prescription   Program Oxygen Prescription None      Home Oxygen   Home Oxygen Device None    Sleep Oxygen Prescription None    Home Exercise Oxygen Prescription None    Home Resting Oxygen Prescription None      Goals/Expected Outcomes   Short Term Goals To learn and demonstrate proper use of respiratory medications;To learn and understand importance of maintaining oxygen saturations>88%;To learn and understand importance of monitoring SPO2 with pulse oximeter and demonstrate accurate use of the pulse oximeter.;To learn and demonstrate proper pursed lip breathing techniques or other breathing techniques.     Long  Term Goals Maintenance of O2 saturations>88%;Compliance with respiratory medication;Verbalizes importance of monitoring SPO2 with pulse oximeter and return demonstration;Exhibits proper breathing techniques, such as pursed lip breathing or other method taught during program session;Demonstrates proper use of MDI's    Goals/Expected Outcomes Compliance and understanding of oxygen saturations monitoring and breathing techniques to decrease shortness of breath.             Initial Exercise Prescription:  Initial Exercise Prescription - 08/18/22 1200       Date of Initial Exercise RX and Referring Provider   Date 08/18/22    Referring Provider Young    Expected Discharge Date 11/16/22      Treadmill   MPH 2    Grade 0    Minutes 15      Recumbant Elliptical   Level 1    RPM 20    Watts 40    Minutes 15      Prescription Details   Frequency (times per week) 2    Duration Progress to 30 minutes of continuous aerobic without signs/symptoms of physical distress      Intensity   Ratings of Perceived Exertion 11-13    Perceived Dyspnea 0-4      Progression   Progression Continue to progress  workloads to maintain intensity without signs/symptoms of physical distress.      Resistance Training   Training Prescription Yes    Weight blue bands    Reps 10-15             Perform Capillary Blood Glucose checks as needed.  Exercise Prescription Changes:   Exercise Prescription Changes     Row Name 08/31/22 1536 09/19/22 1500 09/26/22 1513 10/12/22 1453       Response to Exercise   Blood Pressure (Admit) 106/72 92/62 108/70 92/60    Blood Pressure (Exercise) -- 128/70 -- --    Blood Pressure (Exit) 104/70 96/64 94/64  102/58    Heart Rate (  Admit) 84 bpm 90 bpm 71 bpm 74 bpm    Heart Rate (Exercise) 107 bpm 107 bpm 112 bpm 117 bpm    Heart Rate (Exit) 81 bpm 80 bpm 76 bpm 81 bpm    Oxygen Saturation (Admit) 99 % 100 % 100 % 99 %    Oxygen Saturation (Exercise) 100 % 96 % 94 % 95 %    Oxygen Saturation (Exit) 100 % 97 % 100 % 97 %    Rating of Perceived Exertion (Exercise) 11 11 11 11     Perceived Dyspnea (Exercise) 1 1 1 1     Duration Continue with 30 min of aerobic exercise without signs/symptoms of physical distress. Continue with 30 min of aerobic exercise without signs/symptoms of physical distress. Continue with 30 min of aerobic exercise without signs/symptoms of physical distress. Continue with 30 min of aerobic exercise without signs/symptoms of physical distress.    Intensity THRR unchanged THRR unchanged THRR unchanged THRR unchanged      Progression   Progression Continue to progress workloads to maintain intensity without signs/symptoms of physical distress. Continue to progress workloads to maintain intensity without signs/symptoms of physical distress. Continue to progress workloads to maintain intensity without signs/symptoms of physical distress. Continue to progress workloads to maintain intensity without signs/symptoms of physical distress.      Resistance Training   Training Prescription Yes Yes Yes Yes    Weight blue bands blue bands blue bands blue  bands    Reps 10-15 10-15 10-15 10-15    Time 15 Minutes 15 Minutes 15 Minutes 10 Minutes      Treadmill   MPH 2.5 2.5 2.5 2.5    Grade 1.5 1.5 2 2     Minutes 15 15 15 15     METs 3.43 3.43 3.6 3.4      Recumbant Elliptical   Level -- 3 3 4     Minutes 15 15 15 15     METs 4 3.9 3.9 4      Home Exercise Plan   Plans to continue exercise at -- Home (comment)  walking -- --    Frequency -- Add 1 additional day to program exercise sessions. -- --    Initial Home Exercises Provided -- 09/19/22 -- --             Exercise Comments:   Exercise Comments     Row Name 08/24/22 1643 09/19/22 1519         Exercise Comments Pt completed first day of exercise. Coreen exercised for 15 min on the recumbent elliptical and treadmill. She averaged 2.8 METs at level 1 on the recumbent elliptical and 2.38 METs on the treadmill. She performed the warmup and cooldown standing without limitations. Vale tolerated her first day well. Discussed METs. Discussed with pt home exercise plan. She is currently walking at home 15-30 min 1-2 nonrehab days a week. Encouraged pt to continue and to add time as able, aiming for 30-60 min of walking given that she has weight loss goals as well. Pt agreed.               Exercise Goals and Review:   Exercise Goals     Row Name 08/18/22 1056 09/15/22 1006 10/11/22 1521         Exercise Goals   Increase Physical Activity Yes Yes Yes     Intervention Provide advice, education, support and counseling about physical activity/exercise needs.;Develop an individualized exercise prescription for aerobic and resistive training based on initial evaluation  findings, risk stratification, comorbidities and participant's personal goals. Provide advice, education, support and counseling about physical activity/exercise needs.;Develop an individualized exercise prescription for aerobic and resistive training based on initial evaluation findings, risk stratification,  comorbidities and participant's personal goals. Provide advice, education, support and counseling about physical activity/exercise needs.;Develop an individualized exercise prescription for aerobic and resistive training based on initial evaluation findings, risk stratification, comorbidities and participant's personal goals.     Expected Outcomes Short Term: Attend rehab on a regular basis to increase amount of physical activity.;Long Term: Exercising regularly at least 3-5 days a week.;Long Term: Add in home exercise to make exercise part of routine and to increase amount of physical activity. Short Term: Attend rehab on a regular basis to increase amount of physical activity.;Long Term: Exercising regularly at least 3-5 days a week.;Long Term: Add in home exercise to make exercise part of routine and to increase amount of physical activity. Short Term: Attend rehab on a regular basis to increase amount of physical activity.;Long Term: Exercising regularly at least 3-5 days a week.;Long Term: Add in home exercise to make exercise part of routine and to increase amount of physical activity.     Increase Strength and Stamina Yes Yes Yes     Intervention Provide advice, education, support and counseling about physical activity/exercise needs.;Develop an individualized exercise prescription for aerobic and resistive training based on initial evaluation findings, risk stratification, comorbidities and participant's personal goals. Provide advice, education, support and counseling about physical activity/exercise needs.;Develop an individualized exercise prescription for aerobic and resistive training based on initial evaluation findings, risk stratification, comorbidities and participant's personal goals. Provide advice, education, support and counseling about physical activity/exercise needs.;Develop an individualized exercise prescription for aerobic and resistive training based on initial evaluation findings,  risk stratification, comorbidities and participant's personal goals.     Expected Outcomes Short Term: Increase workloads from initial exercise prescription for resistance, speed, and METs.;Short Term: Perform resistance training exercises routinely during rehab and add in resistance training at home;Long Term: Improve cardiorespiratory fitness, muscular endurance and strength as measured by increased METs and functional capacity ( ) Short Term: Increase workloads from initial exercise prescription for resistance, speed, and METs.;Short Term: Perform resistance training exercises routinely during rehab and add in resistance training at home;Long Term: Improve cardiorespiratory fitness, muscular endurance and strength as measured by increased METs and functional capacity ( ) Short Term: Increase workloads from initial exercise prescription for resistance, speed, and METs.;Short Term: Perform resistance training exercises routinely during rehab and add in resistance training at home;Long Term: Improve cardiorespiratory fitness, muscular endurance and strength as measured by increased METs and functional capacity ( )     Able to understand and use rate of perceived exertion (RPE) scale Yes Yes Yes     Intervention Provide education and explanation on how to use RPE scale Provide education and explanation on how to use RPE scale Provide education and explanation on how to use RPE scale     Expected Outcomes Short Term: Able to use RPE daily in rehab to express subjective intensity level;Long Term:  Able to use RPE to guide intensity level when exercising independently Short Term: Able to use RPE daily in rehab to express subjective intensity level;Long Term:  Able to use RPE to guide intensity level when exercising independently Short Term: Able to use RPE daily in rehab to express subjective intensity level;Long Term:  Able to use RPE to guide intensity level when exercising independently     Able to  understand and  use Dyspnea scale Yes Yes Yes     Intervention Provide education and explanation on how to use Dyspnea scale Provide education and explanation on how to use Dyspnea scale Provide education and explanation on how to use Dyspnea scale     Expected Outcomes Short Term: Able to use Dyspnea scale daily in rehab to express subjective sense of shortness of breath during exertion;Long Term: Able to use Dyspnea scale to guide intensity level when exercising independently Short Term: Able to use Dyspnea scale daily in rehab to express subjective sense of shortness of breath during exertion;Long Term: Able to use Dyspnea scale to guide intensity level when exercising independently Short Term: Able to use Dyspnea scale daily in rehab to express subjective sense of shortness of breath during exertion;Long Term: Able to use Dyspnea scale to guide intensity level when exercising independently     Knowledge and understanding of Target Heart Rate Range (THRR) Yes Yes Yes     Intervention Provide education and explanation of THRR including how the numbers were predicted and where they are located for reference Provide education and explanation of THRR including how the numbers were predicted and where they are located for reference Provide education and explanation of THRR including how the numbers were predicted and where they are located for reference     Expected Outcomes Short Term: Able to state/look up THRR;Long Term: Able to use THRR to govern intensity when exercising independently;Short Term: Able to use daily as guideline for intensity in rehab Short Term: Able to state/look up THRR;Long Term: Able to use THRR to govern intensity when exercising independently;Short Term: Able to use daily as guideline for intensity in rehab Short Term: Able to state/look up THRR;Long Term: Able to use THRR to govern intensity when exercising independently;Short Term: Able to use daily as guideline for intensity in rehab      Understanding of Exercise Prescription Yes Yes Yes     Intervention Provide education, explanation, and written materials on patient's individual exercise prescription Provide education, explanation, and written materials on patient's individual exercise prescription Provide education, explanation, and written materials on patient's individual exercise prescription     Expected Outcomes Short Term: Able to explain program exercise prescription;Long Term: Able to explain home exercise prescription to exercise independently Short Term: Able to explain program exercise prescription;Long Term: Able to explain home exercise prescription to exercise independently Short Term: Able to explain program exercise prescription;Long Term: Able to explain home exercise prescription to exercise independently              Exercise Goals Re-Evaluation :  Exercise Goals Re-Evaluation     Row Name 09/15/22 1006 10/11/22 1522           Exercise Goal Re-Evaluation   Exercise Goals Review Increase Physical Activity;Able to understand and use Dyspnea scale;Understanding of Exercise Prescription;Increase Strength and Stamina;Knowledge and understanding of Target Heart Rate Range (THRR);Able to understand and use rate of perceived exertion (RPE) scale Increase Physical Activity;Able to understand and use Dyspnea scale;Understanding of Exercise Prescription;Increase Strength and Stamina;Knowledge and understanding of Target Heart Rate Range (THRR);Able to understand and use rate of perceived exertion (RPE) scale      Comments Pt has completed 4 days of exercise. She has missed the last 2 sessions due to prior engagements. Shekina exercised for 15 min on the recumbent elliptical and treadmill. She averaged 3.8 METs at level 3 on the recumbent elliptical and 3.43 METs on the treadmill, 2.5 speed, 1.5 incline. She performed  the warmup and cooldown standing without limitations. She is progressing and will monitor. Pt has  completed 8 days of exercise. She has missed multiple sessions for various reasons, the last due to illness.  Rada exercised for 15 min on the recumbent elliptical and treadmill. She averaged 3.7 METs at level 4 on the recumbent elliptical and 3.6 METs on the treadmill, 2.5 speed, 2 incline. She performed the warmup and cooldown standing without limitations. She is progressing and will monitor.      Expected Outcomes Through exercise at rehab and at home, the patient will decrease shortness of breath with daily activities and feel confident in carrying out an exercise regime at home. Through exercise at rehab and at home, the patient will decrease shortness of breath with daily activities and feel confident in carrying out an exercise regime at home.               Discharge Exercise Prescription (Final Exercise Prescription Changes):  Exercise Prescription Changes - 10/12/22 1453       Response to Exercise   Blood Pressure (Admit) 92/60    Blood Pressure (Exit) 102/58    Heart Rate (Admit) 74 bpm    Heart Rate (Exercise) 117 bpm    Heart Rate (Exit) 81 bpm    Oxygen Saturation (Admit) 99 %    Oxygen Saturation (Exercise) 95 %    Oxygen Saturation (Exit) 97 %    Rating of Perceived Exertion (Exercise) 11    Perceived Dyspnea (Exercise) 1    Duration Continue with 30 min of aerobic exercise without signs/symptoms of physical distress.    Intensity THRR unchanged      Progression   Progression Continue to progress workloads to maintain intensity without signs/symptoms of physical distress.      Resistance Training   Training Prescription Yes    Weight blue bands    Reps 10-15    Time 10 Minutes      Treadmill   MPH 2.5    Grade 2    Minutes 15    METs 3.4      Recumbant Elliptical   Level 4    Minutes 15    METs 4             Nutrition:  Target Goals: Understanding of nutrition guidelines, daily intake of sodium 1500mg , cholesterol 200mg , calories 30% from fat and  7% or less from saturated fats, daily to have 5 or more servings of fruits and vegetables.  Biometrics:  Pre Biometrics - 08/18/22 1206       Pre Biometrics   Grip Strength 20 kg              Nutrition Therapy Plan and Nutrition Goals:  Nutrition Therapy & Goals - 09/19/22 1439       Nutrition Therapy   Diet General Healthy Diet      Personal Nutrition Goals   Nutrition Goal Patient to improve diet quality by using the plate method as a guide for meal planning to include lean protein/plant protein, fruits, vegetables, whole grains, nonfat dairy as part of a well-balanced diet.    Comments Bernadean reports following a vegetarian diet for the last 5 years; her primary protein sources are beans/legumes and Ensure/Boost. She has maintained her weight since starting with our program. Jonikka will benefit from participation in pulmonary rehab for nutrition,exercise,and lifestyle modification.      Intervention Plan   Intervention Prescribe, educate and counsel regarding individualized specific dietary modifications aiming  towards targeted core components such as weight, hypertension, lipid management, diabetes, heart failure and other comorbidities.;Nutrition handout(s) given to patient.    Expected Outcomes Short Term Goal: Understand basic principles of dietary content, such as calories, fat, sodium, cholesterol and nutrients.;Long Term Goal: Adherence to prescribed nutrition plan.             Nutrition Assessments:  Nutrition Assessments - 08/24/22 1536       Rate Your Plate Scores   Pre Score 59            MEDIFICTS Score Key: ?70 Need to make dietary changes  40-70 Heart Healthy Diet ? 40 Therapeutic Level Cholesterol Diet  Flowsheet Row PULMONARY REHAB CHRONIC OBSTRUCTIVE PULMONARY DISEASE from 08/24/2022 in [facility] for Heart, Vascular, & Lung Health  Picture Your Plate Total Score on Admission 59      Picture Your Plate Scores: <16  Unhealthy dietary pattern with much room for improvement. 41-50 Dietary pattern unlikely to meet recommendations for good health and room for improvement. 51-60 More healthful dietary pattern, with some room for improvement.  >60 Healthy dietary pattern, although there may be some specific behaviors that could be improved.    Nutrition Goals Re-Evaluation:  Nutrition Goals Re-Evaluation     Row Name 08/24/22 1501 09/19/22 1439           Goals   Current Weight 208 lb 15.9 oz (94.8 kg) 208 lb 15.9 oz (94.8 kg)      Comment Lipid panel (2020) WNL, no new labs.      Expected Outcome Kevia reports following a vegetarian diet for the last 5 years; her primary protein sources are beans/legumes and Ensure/Boost. Koty will benefit from participation in pulmonary rehab for nutrition,exercise,and lifestyle modification. Talaiyah reports following a vegetarian diet for the last 5 years; her primary protein sources are beans/legumes and Ensure/Boost. She has maintained her weight since starting with our program. Irielle will benefit from participation in pulmonary rehab for nutrition,exercise,and lifestyle modification.               Nutrition Goals Discharge (Final Nutrition Goals Re-Evaluation):  Nutrition Goals Re-Evaluation - 09/19/22 1439       Goals   Current Weight 208 lb 15.9 oz (94.8 kg)    Comment no new labs.    Expected Outcome Brailynn reports following a vegetarian diet for the last 5 years; her primary protein sources are beans/legumes and Ensure/Boost. She has maintained her weight since starting with our program. Teisha will benefit from participation in pulmonary rehab for nutrition,exercise,and lifestyle modification.             Psychosocial: Target Goals: Acknowledge presence or absence of significant depression and/or stress, maximize coping skills, provide positive support system. Participant is able to verbalize types and ability to use techniques and skills needed for reducing  stress and depression.  Initial Review & Psychosocial Screening:  Initial Psych Review & Screening - 08/18/22 1052       Initial Review   Current issues with None Identified      Family Dynamics   Good Support System? Yes      Barriers   Psychosocial barriers to participate in program There are no identifiable barriers or psychosocial needs.      Screening Interventions   Interventions Encouraged to exercise             Quality of Life Scores:  Scores of 19 and below usually indicate a poorer quality of  life in these areas.  A difference of  2-3 points is a clinically meaningful difference.  A difference of 2-3 points in the total score of the Quality of Life Index has been associated with significant improvement in overall quality of life, self-image, physical symptoms, and general health in studies assessing change in quality of life.  PHQ-9: Review Flowsheet       08/18/2022 06/02/2019 07/05/2018  Depression screen PHQ 2/9  Decreased Interest 0 0 0  Down, Depressed, Hopeless 0 0 0  PHQ - 2 Score 0 0 0  Altered sleeping 1 - -  Tired, decreased energy 0 - -  Change in appetite 2 - -  Feeling bad or failure about yourself  0 - -  Trouble concentrating 0 - -  Moving slowly or fidgety/restless 0 - -  Suicidal thoughts 0 - -  PHQ-9 Score 3 - -  Difficult doing work/chores Somewhat difficult - -   Interpretation of Total Score  Total Score Depression Severity:  1-4 = Minimal depression, 5-9 = Mild depression, 10-14 = Moderate depression, 15-19 = Moderately severe depression, 20-27 = Severe depression   Psychosocial Evaluation and Intervention:  Psychosocial Evaluation - 08/18/22 1053       Psychosocial Evaluation & Interventions   Interventions Encouraged to exercise with the program and follow exercise prescription    Comments Pt denies any psychosocial barriers or concerns at this time    Expected Outcomes For pt to participate in PR free of any psychosocial  barriers or concerns    Continue Psychosocial Services  No Follow up required             Psychosocial Re-Evaluation:  Psychosocial Re-Evaluation     Row Name 09/13/22 1404 10/09/22 1610           Psychosocial Re-Evaluation   Current issues with None Identified None Identified      Comments Hazleigh denies any psychosocial barriers or concerns at this time. [place] denies any psychosocial barriers or concerns at this time.      Expected Outcomes For Danielys to participate in PR free of any psychosocial barriers or concerns. For Nicolee to participate in PR free of any psychosocial barriers or concerns.      Interventions Encouraged to attend Pulmonary Rehabilitation for the exercise Encouraged to attend Pulmonary Rehabilitation for the exercise      Continue Psychosocial Services  No Follow up required No Follow up required               Psychosocial Discharge (Final Psychosocial Re-Evaluation):  Psychosocial Re-Evaluation - 10/09/22 0921       Psychosocial Re-Evaluation   Current issues with None Identified    Comments Mekah denies any psychosocial barriers or concerns at this time.    Expected Outcomes For Luanna to participate in PR free of any psychosocial barriers or concerns.    Interventions Encouraged to attend Pulmonary Rehabilitation for the exercise    Continue Psychosocial Services  No Follow up required             Education: Education Goals: Education classes will be provided on a weekly basis, covering required topics. Participant will state understanding/return demonstration of topics presented.  Learning Barriers/Preferences:  Learning Barriers/Preferences - 08/18/22 1054       Learning Barriers/Preferences   Learning Barriers None    Learning Preferences None             Education Topics: Introduction to Pulmonary Rehab Group instruction provided by PowerPoint,  verbal discussion, and written material to support subject matter. Instructor reviews  what Pulmonary Rehab is, the purpose of the program, and how patients are referred.     Know Your Numbers Group instruction that is supported by a PowerPoint presentation. Instructor discusses importance of knowing and understanding resting, exercise, and post-exercise oxygen saturation, heart rate, and blood pressure. Oxygen saturation, heart rate, blood pressure, rating of perceived exertion, and dyspnea are reviewed along with a normal range for these values.    Exercise for the Pulmonary Patient Group instruction that is supported by a PowerPoint presentation. Instructor discusses benefits of exercise, core components of exercise, frequency, duration, and intensity of an exercise routine, importance of utilizing pulse oximetry during exercise, safety while exercising, and options of places to exercise outside of rehab.       MET Level  Group instruction provided by PowerPoint, verbal discussion, and written material to support subject matter. Instructor reviews what METs are and how to increase METs.  Flowsheet Row PULMONARY REHAB CHRONIC OBSTRUCTIVE PULMONARY DISEASE from 10/12/2022 in [facility] for Heart, Vascular, & Lung Health  Date 10/12/22  Educator EP  Instruction Review Code 1- Verbalizes Understanding       Pulmonary Medications Verbally interactive group education provided by instructor with focus on inhaled medications and proper administration.   Anatomy and Physiology of the Respiratory System Group instruction provided by PowerPoint, verbal discussion, and written material to support subject matter. Instructor reviews respiratory cycle and anatomical components of the respiratory system and their functions. Instructor also reviews differences in obstructive and restrictive respiratory diseases with examples of each.    Oxygen Safety Group instruction provided by PowerPoint, verbal discussion, and written material to support subject matter.  There is an overview of "What is Oxygen" and "Why do we need it".  Instructor also reviews how to create a safe environment for oxygen use, the importance of using oxygen as prescribed, and the risks of noncompliance. There is a brief discussion on traveling with oxygen and resources the patient may utilize. Flowsheet Row PULMONARY REHAB CHRONIC OBSTRUCTIVE PULMONARY DISEASE from 08/24/2022 in [facility] for Heart, Vascular, & Lung Health  Date 08/24/22  Educator RN  Instruction Review Code 1- Verbalizes Understanding       Oxygen Use Group instruction provided by PowerPoint, verbal discussion, and written material to discuss how supplemental oxygen is prescribed and different types of oxygen supply systems. Resources for more information are provided.  Flowsheet Row PULMONARY REHAB CHRONIC OBSTRUCTIVE PULMONARY DISEASE from 08/31/2022 in [facility] for Heart, Vascular, & Lung Health  Date 08/31/22  Educator RT  Instruction Review Code 1- Verbalizes Understanding       Breathing Techniques Group instruction that is supported by demonstration and informational handouts. Instructor discusses the benefits of pursed lip and diaphragmatic breathing and detailed demonstration on how to perform both.     Risk Factor Reduction Group instruction that is supported by a PowerPoint presentation. Instructor discusses the definition of a risk factor, different risk factors for pulmonary disease, and how the heart and lungs work together.   MD Day A group question and answer session with a medical doctor that allows participants to ask questions that relate to their pulmonary disease state.   Nutrition for the Pulmonary Patient Group instruction provided by PowerPoint slides, verbal discussion, and written materials to support subject matter. The instructor gives an explanation and review of healthy diet recommendations, which includes a discussion on  weight management, recommendations for fruit and vegetable consumption, as well as protein, fluid, caffeine, fiber, sodium, sugar, and alcohol. Tips for eating when patients are short of breath are discussed.    Other Education Group or individual verbal, written, or video instructions that support the educational goals of the pulmonary rehab program. Flowsheet Row PULMONARY REHAB CHRONIC OBSTRUCTIVE PULMONARY DISEASE from 09/21/2022 in [facility] for Heart, Vascular, & Lung Health  Date 09/21/22  Educator RN  Instruction Review Code 1- Verbalizes Understanding        Knowledge Questionnaire Score:  Knowledge Questionnaire Score - 08/18/22 1110       Knowledge Questionnaire Score   Pre Score 16/18             Core Components/Risk Factors/Patient Goals at Admission:  Personal Goals and Risk Factors at Admission - 08/18/22 1055       Core Components/Risk Factors/Patient Goals on Admission    Weight Management Weight Loss    Improve shortness of breath with ADL's Yes    Intervention Provide education, individualized exercise plan and daily activity instruction to help decrease symptoms of SOB with activities of daily living.    Expected Outcomes Short Term: Improve cardiorespiratory fitness to achieve a reduction of symptoms when performing ADLs;Long Term: Be able to perform more ADLs without symptoms or delay the onset of symptoms    Increase knowledge of respiratory medications and ability to use respiratory devices properly  Yes    Intervention Provide education and demonstration as needed of appropriate use of medications, inhalers, and oxygen therapy.    Expected Outcomes Short Term: Achieves understanding of medications use. Understands that oxygen is a medication prescribed by physician. Demonstrates appropriate use of inhaler and oxygen therapy.;Long Term: Maintain appropriate use of medications, inhalers, and oxygen therapy.             Core  Components/Risk Factors/Patient Goals Review:   Goals and Risk Factor Review     Row Name 09/13/22 1407 10/09/22 0924           Core Components/Risk Factors/Patient Goals Review   Personal Goals Review Weight Management/Obesity;Improve shortness of breath with ADL's;Develop more efficient breathing techniques such as purse lipped breathing and diaphragmatic breathing and practicing self-pacing with activity.;Increase knowledge of respiratory medications and ability to use respiratory devices properly. Weight Management/Obesity;Improve shortness of breath with ADL's      Review Karli is doing well in PR class. She enjoy's coming to class and exercising. 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 Arin has not met her weight loss goals, but is working with staff dietician to obtain her goal. She uses purse lip breathing when she becomes SOB. Parisa is able to report her rate of perceived exertion and dyspnea scale when asked by staff. Kelyn is knowledgeable about her respiratory medications. We will continue to monitor Violet's progress throughout the program. Dannelle is doing well in PR class. She enjoy's coming to class and exercising. Kaleigh has not met her weight loss goals, but is working with staff dietician to obtain her goal. She has met her goal for developing more efficient breathing techniques as she uses purse lip breathing when she becomes SOB. Ishani is able to report her rate of perceived exertion and dyspnea scale when asked by staff. Karmyn is knowledgeable about her respiratory medications. Hetal will benefit from participation in PR for nutrition, exercise and lifestyle modification.  Expected Outcomes See admission goals See admission goals               Core Components/Risk Factors/Patient Goals at Discharge (Final Review):   Goals and Risk Factor Review - 10/09/22 0924       Core Components/Risk Factors/Patient Goals Review    Personal Goals Review Weight Management/Obesity;Improve shortness of breath with ADL's    Review Pyper is doing well in PR class. She enjoy's coming to class and exercising. Deeya has not met her weight loss goals, but is working with staff dietician to obtain her goal. She has met her goal for developing more efficient breathing techniques as she uses purse lip breathing when she becomes SOB. Marqita is able to report her rate of perceived exertion and dyspnea scale when asked by staff. Keajah is knowledgeable about her respiratory medications. Jetzabel will benefit from participation in PR for nutrition, exercise and lifestyle modification.    Expected Outcomes See admission goals             ITP Comments:   Comments: Pt is making expected progress toward Pulmonary Rehab goals after completing 9 sessions. Recommend continued exercise, life style modification, education, and utilization of breathing techniques to increase stamina and strength, while also decreasing shortness of breath with exertion.  Dr. Mechele Collin is Medical Director for Pulmonary Rehab at [facility].

## 2022-10-19 ENCOUNTER — Encounter (HOSPITAL_COMMUNITY): Payer: Commercial Managed Care - HMO

## 2022-10-24 ENCOUNTER — Encounter (HOSPITAL_COMMUNITY): Payer: Commercial Managed Care - HMO

## 2022-10-26 ENCOUNTER — Encounter (HOSPITAL_COMMUNITY): Payer: Commercial Managed Care - HMO

## 2022-10-31 ENCOUNTER — Encounter (HOSPITAL_COMMUNITY)
Admission: RE | Admit: 2022-10-31 | Discharge: 2022-10-31 | Disposition: A | Discharge status: Home / Self Care | Payer: Commercial Managed Care - HMO | Source: Ambulatory Visit | Attending: Internal Medicine | Admitting: Internal Medicine

## 2022-10-31 VITALS — Wt 213.6 lb

## 2022-10-31 DIAGNOSIS — J449 Chronic obstructive pulmonary disease, unspecified: Secondary | ICD-10-CM

## 2022-10-31 NOTE — Progress Notes (Signed)
Daily Session Note  Patient Details  Name: Jocelyn Myers MRN: [id number] Date of Birth: 1966/01/14 Referring Provider:   Doristine Devoid Pulmonary Rehab Walk Test from 08/18/2022 in [facility] for Heart, Vascular, & Lung Health  Referring Provider Young       Encounter Date: 10/31/2022  Check In:  Session Check In - 10/31/22 1429       Check-In   Supervising physician immediately available to respond to emergencies [facility] MD immediately available    Physician(s) Edd Fabian, NP    Location [facility]    Staff Present Samantha Belarus, RD, LDN;Randi Dionisio Paschal, ACSM-CEP, Exercise Physiologist;Kaylee Earlene Plater, MS, ACSM-CEP, Exercise Physiologist;Stephano Arrants Robina Ade, RN, MSN;Mary Bastin, RN, BSN    Virtual Visit No    Medication changes reported     No    Fall or balance concerns reported    No    Tobacco Cessation No Change    Warm-up and Cool-down Performed as group-led instruction    Resistance Training Performed Yes    VAD Patient? No    PAD/SET Patient? No      Pain Assessment   Currently in Pain? No/denies    Multiple Pain Sites No             Capillary Blood Glucose: No results found for this or any previous visit (from the past 24 hour(s)).   Exercise Prescription Changes - 10/31/22 1500       Response to Exercise   Blood Pressure (Admit) 108/62    Blood Pressure (Exercise) 118/71    Blood Pressure (Exit) 100/56    Heart Rate (Admit) 86 bpm    Heart Rate (Exercise) 121 bpm    Heart Rate (Exit) 82 bpm    Oxygen Saturation (Admit) 100 %    Oxygen Saturation (Exercise) 97 %    Oxygen Saturation (Exit) 98 %    Rating of Perceived Exertion (Exercise) 11    Perceived Dyspnea (Exercise) 1    Duration Continue with 30 min of aerobic exercise without signs/symptoms of physical distress.    Intensity THRR unchanged      Resistance Training   Training Prescription Yes    Weight blue bands    Reps 10-15    Time 10  Minutes      Treadmill   MPH 2.7    Grade 3.4    Minutes 15    METs 4.37      Recumbant Elliptical   Level 4    Minutes 15    METs 3.6             Social History   Tobacco Use  Smoking Status Never  Smokeless Tobacco Never    Goals Met:  Proper associated with RPD/PD & O2 Sat Independence with exercise equipment Exercise tolerated well No report of concerns or symptoms today Strength training completed today  Goals Unmet:  Not Applicable  Comments: Service time is from 1321 to 1450.    Dr. Mechele Collin is Medical Director for Pulmonary Rehab at [facility].

## 2022-11-02 ENCOUNTER — Encounter (HOSPITAL_COMMUNITY): Payer: Commercial Managed Care - HMO

## 2022-11-07 ENCOUNTER — Encounter (HOSPITAL_COMMUNITY): Payer: Commercial Managed Care - HMO

## 2022-11-08 ENCOUNTER — Telehealth (HOSPITAL_COMMUNITY): Payer: Self-pay | Admitting: *Deleted

## 2022-11-14 ENCOUNTER — Encounter (HOSPITAL_COMMUNITY)
Admission: RE | Admit: 2022-11-14 | Discharge: 2022-11-14 | Disposition: A | Discharge status: Home / Self Care | Payer: Commercial Managed Care - HMO | Source: Ambulatory Visit | Attending: Internal Medicine | Admitting: Internal Medicine

## 2022-11-14 VITALS — Wt 216.1 lb

## 2022-11-14 DIAGNOSIS — J449 Chronic obstructive pulmonary disease, unspecified: Secondary | ICD-10-CM | POA: Diagnosis present

## 2022-11-14 NOTE — Progress Notes (Signed)
Daily Session Note  Patient Details  Name: KECIA BAWDEN MRN: [id number] Date of Birth: 1966/02/26 Referring Provider:   Doristine Devoid Pulmonary Rehab Walk Test from 08/18/2022 in [facility] for Heart, Vascular, & Lung Health  Referring Provider Young       Encounter Date: 11/14/2022  Check In:  Session Check In - 11/14/22 1428       Check-In   Supervising physician immediately available to respond to emergencies [facility] MD immediately available    Physician(s) Carlyon Shadow, NP    Location [facility]    Staff Present Velora Mediate, RN, MSN;Samantha Belarus, RD, [place] Gray, RN, BSN;Randi Reeve BS, ACSM-CEP, Exercise Physiologist;Kaylee Earlene Plater, MS, ACSM-CEP, Exercise Physiologist;Mang Hazelrigg Katrinka Blazing, RT    Virtual Visit No    Medication changes reported     No    Fall or balance concerns reported    No    Tobacco Cessation No Change    Warm-up and Cool-down Performed as group-led instruction    Resistance Training Performed Yes    VAD Patient? No    PAD/SET Patient? No      Pain Assessment   Currently in Pain? No/denies    Multiple Pain Sites No             Capillary Blood Glucose: No results found for this or any previous visit (from the past 24 hour(s)).   Exercise Prescription Changes - 11/14/22 1500       Response to Exercise   Blood Pressure (Admit) 92/70    Blood Pressure (Exercise) 124/68    Blood Pressure (Exit) 102/64    Heart Rate (Admit) 98 bpm    Heart Rate (Exercise) 122 bpm    Heart Rate (Exit) 93 bpm    Oxygen Saturation (Admit) 98 %    Oxygen Saturation (Exercise) 97 %    Oxygen Saturation (Exit) 100 %    Rating of Perceived Exertion (Exercise) 7    Perceived Dyspnea (Exercise) 1    Duration Continue with 30 min of aerobic exercise without signs/symptoms of physical distress.    Intensity THRR unchanged      Progression   Progression Continue to progress workloads to maintain intensity without  signs/symptoms of physical distress.      Resistance Training   Training Prescription Yes    Weight blue bands    Reps 10-15    Time 10 Minutes      Treadmill   MPH 2.4    Grade 3.5    Minutes 15    METs 3.7             Social History   Tobacco Use  Smoking Status Never  Smokeless Tobacco Never    Goals Met:  Proper associated with RPD/PD & O2 Sat Independence with exercise equipment Exercise tolerated well No report of concerns or symptoms today Strength training completed today  Goals Unmet:  Not Applicable  Comments: Service time is from 1319 to 1450.    Dr. Mechele Collin is Medical Director for Pulmonary Rehab at [facility].

## 2022-11-15 ENCOUNTER — Other Ambulatory Visit: Payer: Self-pay

## 2022-11-15 NOTE — Progress Notes (Signed)
Pulmonary Individual Treatment Plan  Patient Details  Name: Jocelyn Myers MRN: [id number] Date of Birth: [birth year] Referring Provider:   Doristine Devoid Pulmonary Rehab Walk Test from 08/18/2022 in [facility]  Referring Provider Young       Initial Encounter Date:  Flowsheet Row Pulmonary Rehab Walk Test from 08/18/2022 in [facility]  Date 08/18/22       Visit Diagnosis: Stage 3 severe COPD by GOLD classification (HCC)  Patient's Home Medications on Admission:   Current Outpatient Medications:    albuterol (PROAIR HFA) 108 (90 BASE) MCG/ACT inhaler, Inhale 2 puffs into the lungs every 6 (six) hours as needed for wheezing or shortness of breath., Disp: 3 Inhaler, Rfl: 1   amoxicillin-clavulanate (AUGMENTIN) 875-125 MG tablet, Take 1 tablet by mouth 2 (two) times daily. (Patient not taking: Reported on 08/18/2022), Disp: 14 tablet, Rfl: 0   benzonatate (TESSALON) 100 MG capsule, TAKE 1 CAPSULE BY MOUTH EVERY 8 HOURS AS NEEDED FOR COUGH (Patient not taking: Reported on 08/18/2022), Disp: 40 capsule, Rfl: 0   Dupilumab (DUPIXENT) 300 MG/2ML SOPN, Inject 300 mg into the skin every 14 (fourteen) days., Disp: 4 mL, Rfl: 5   fluticasone (FLONASE) 50 MCG/ACT nasal spray, Place 2 sprays into both nostrils daily., Disp: 16 g, Rfl: 2   guaiFENesin (MUCINEX) 600 MG 12 hr tablet, Take 1 tablet (600 mg total) by mouth 2 (two) times daily. (Patient not taking: Reported on 08/18/2022), Disp: , Rfl:    ipratropium-albuterol (DUONEB) 0.5-2.5 (3) MG/3ML SOLN, Inhale 1 neb every 6 hours as needed, Disp: 75 mL, Rfl: 12   mometasone-formoterol (DULERA) 200-5 MCG/ACT AERO, Inhale 2 puffs into the lungs in the morning and at bedtime., Disp: 13 g, Rfl: 5   montelukast (SINGULAIR) 10 MG tablet, TAKE 1 TABLET BY MOUTH EVERYDAY AT BEDTIME, Disp: 90 tablet, Rfl: 1   Multiple Vitamin (MULTIVITAMIN)  tablet, Take 1 tablet by mouth daily., Disp: , Rfl:    promethazine-dextromethorphan (PROMETHAZINE-DM) 6.25-15 MG/5ML syrup, 5 ml ever 8 hours if needed for cough, Disp: 240 mL, Rfl: 12  Past Medical History: Past Medical History:  Diagnosis Date   Asthma    COPD (chronic obstructive pulmonary disease) (HCC)     Tobacco Use: Social History   Tobacco Use  Smoking Status Never  Smokeless Tobacco Never    Labs: Review Flowsheet       Latest Ref Rng & Units 02/23/2014 06/23/2016 07/17/2018  Labs for ITP Cardiac and Pulmonary Rehab  Cholestrol 0 - 200 mg/dL 132  - 440   LDL (calc) 0 - 99 mg/dL 102  - 98   HDL-C >72.53 mg/dL 66.44  - 03.47   Trlycerides 0.0 - 149.0 mg/dL 42.5  - 95.6   Bicarbonate 20.0 - 28.0 mmol/L - 25.9  -  TCO2 0 - 100 mmol/L - 28  -  Acid-base deficit 0.0 - 2.0 mmol/L - 2.0  -  O2 Saturation % - 70.0  -    Capillary Blood Glucose: Lab Results  Component Value Date   GLUCAP 126 (H) 06/24/2016   GLUCAP 136 (H) 06/24/2016   GLUCAP 122 (H) 06/23/2016   GLUCAP 168 (H) 06/23/2016   GLUCAP 167 (H) 06/23/2016     Pulmonary Assessment Scores:  Pulmonary Assessment Scores     Row Name 08/18/22 1107 11/14/22 1538       ADL UCSD   ADL Phase  Entry Exit    SOB Score total 23 --      CAT Score   CAT Score 25 --      mMRC Score   mMRC Score 1 1            UCSD: Self-administered rating of dyspnea associated with activities of daily living (ADLs) 6-point scale (0 = "not at all" to 5 = "maximal or unable to do because of breathlessness")  Scoring Scores range from 0 to 120.  Minimally important difference is 5 units  CAT: CAT can identify the health impairment of COPD patients and is better correlated with disease progression.  CAT has a scoring range of zero to 40. The CAT score is classified into four groups of low (less than 10), medium (10 - 20), high (21-30) and very high (31-40) based on the impact level of disease on health status. A CAT  score over 10 suggests significant symptoms.  A worsening CAT score could be explained by an exacerbation, poor medication adherence, poor inhaler technique, or progression of COPD or comorbid conditions.  CAT MCID is 2 points  mMRC: mMRC (Modified Medical Research Council) Dyspnea Scale is used to assess the degree of baseline functional disability in patients of respiratory disease due to dyspnea. No minimal important difference is established. A decrease in score of 1 point or greater is considered a positive change.   Pulmonary Function Assessment:  Pulmonary Function Assessment - 08/18/22 1158       Breath   Bilateral Breath Sounds Clear    Shortness of Breath No             Exercise Target Goals: Exercise Program Goal: Individual exercise prescription set using results from initial 6 min walk test and THRR while considering  patient's activity barriers and safety.   Exercise Prescription Goal: Initial exercise prescription builds to 30-45 minutes a day of aerobic activity, 2-3 days per week.  Home exercise guidelines will be given to patient during program as part of exercise prescription that the participant will acknowledge.  Activity Barriers & Risk Stratification:  Activity Barriers & Cardiac Risk Stratification - 08/18/22 1056       Activity Barriers & Cardiac Risk Stratification   Activity Barriers Deconditioning;Shortness of Breath;Muscular Weakness             6 Minute Walk:  6 Minute Walk     Row Name 08/18/22 1202 11/14/22 1534       6 Minute Walk   Phase Initial Discharge    Distance 1220 feet 1440 feet    Distance % Change -- 18.03 %    Distance Feet Change -- 220 ft    Walk Time 6 minutes 6 minutes    # of Rest Breaks 0 0    MPH 2.31 2.73    METS 3.06 3.34    RPE 11 11    Perceived Dyspnea  0 1    VO2 Peak 10.7 11.68    Symptoms No No    Resting HR 96 bpm 98 bpm    Resting BP 98/60 92/70    Resting Oxygen Saturation  98 % 98 %     Exercise Oxygen Saturation  during 6 min walk 93 % 97 %    Max Ex. HR 126 bpm 122 bpm    Max Ex. BP 122/74 124/68    2 Minute Post BP 98/74 110/72      Interval HR   1 Minute HR 111 111  2 Minute HR 108 114    3 Minute HR 110 117    4 Minute HR 110 119    5 Minute HR 112 120    6 Minute HR 126 122    2 Minute Post HR 100 102    Interval Heart Rate? Yes --      Interval Oxygen   Interval Oxygen? Yes --    Baseline Oxygen Saturation % 98 % 98 %    1 Minute Oxygen Saturation % 98 % 97 %    1 Minute Liters of Oxygen 0 L 0 L    2 Minute Oxygen Saturation % 93 % 97 %    2 Minute Liters of Oxygen 0 L 0 L    3 Minute Oxygen Saturation % 96 % 97 %    3 Minute Liters of Oxygen 0 L 0 L    4 Minute Oxygen Saturation % 94 % 97 %    4 Minute Liters of Oxygen 0 L 0 L    5 Minute Oxygen Saturation % 96 % 97 %    5 Minute Liters of Oxygen 0 L 0 L    6 Minute Oxygen Saturation % 94 % 98 %    6 Minute Liters of Oxygen 0 L 0 L    2 Minute Post Oxygen Saturation % 100 % 99 %    2 Minute Post Liters of Oxygen 0 L 0 L             Oxygen Initial Assessment:  Oxygen Initial Assessment - 08/18/22 1100       Home Oxygen   Home Oxygen Device None    Sleep Oxygen Prescription None    Home Exercise Oxygen Prescription None    Home Resting Oxygen Prescription None      Initial 6 min Walk   Oxygen Used None      Program Oxygen Prescription   Program Oxygen Prescription None      Intervention   Short Term Goals To learn and demonstrate proper use of respiratory medications;To learn and understand importance of maintaining oxygen saturations>88%;To learn and understand importance of monitoring SPO2 with pulse oximeter and demonstrate accurate use of the pulse oximeter.;To learn and demonstrate proper pursed lip breathing techniques or other breathing techniques.     Long  Term Goals Maintenance of O2 saturations>88%;Compliance with respiratory medication;Verbalizes importance of monitoring  SPO2 with pulse oximeter and return demonstration;Exhibits proper breathing techniques, such as pursed lip breathing or other method taught during program session;Demonstrates proper use of MDI's             Oxygen Re-Evaluation:  Oxygen Re-Evaluation     Row Name 09/15/22 1009 10/11/22 1524 11/07/22 0929         Program Oxygen Prescription   Program Oxygen Prescription None None None       Home Oxygen   Home Oxygen Device None None None     Sleep Oxygen Prescription None None None     Home Exercise Oxygen Prescription None None None     Home Resting Oxygen Prescription None None None       Goals/Expected Outcomes   Short Term Goals To learn and demonstrate proper use of respiratory medications;To learn and understand importance of maintaining oxygen saturations>88%;To learn and understand importance of monitoring SPO2 with pulse oximeter and demonstrate accurate use of the pulse oximeter.;To learn and demonstrate proper pursed lip breathing techniques or other breathing techniques.  To learn and demonstrate proper  use of respiratory medications;To learn and understand importance of maintaining oxygen saturations>88%;To learn and understand importance of monitoring SPO2 with pulse oximeter and demonstrate accurate use of the pulse oximeter.;To learn and demonstrate proper pursed lip breathing techniques or other breathing techniques.  To learn and demonstrate proper use of respiratory medications;To learn and understand importance of maintaining oxygen saturations>88%;To learn and understand importance of monitoring SPO2 with pulse oximeter and demonstrate accurate use of the pulse oximeter.;To learn and demonstrate proper pursed lip breathing techniques or other breathing techniques.      Long  Term Goals Maintenance of O2 saturations>88%;Compliance with respiratory medication;Verbalizes importance of monitoring SPO2 with pulse oximeter and return demonstration;Exhibits proper breathing  techniques, such as pursed lip breathing or other method taught during program session;Demonstrates proper use of MDI's Maintenance of O2 saturations>88%;Compliance with respiratory medication;Verbalizes importance of monitoring SPO2 with pulse oximeter and return demonstration;Exhibits proper breathing techniques, such as pursed lip breathing or other method taught during program session;Demonstrates proper use of MDI's Maintenance of O2 saturations>88%;Compliance with respiratory medication;Verbalizes importance of monitoring SPO2 with pulse oximeter and return demonstration;Exhibits proper breathing techniques, such as pursed lip breathing or other method taught during program session;Demonstrates proper use of MDI's     Goals/Expected Outcomes Compliance and understanding of oxygen saturations monitoring and breathing techniques to decrease shortness of breath. Compliance and understanding of oxygen saturations monitoring and breathing techniques to decrease shortness of breath. Compliance and understanding of oxygen saturations monitoring and breathing techniques to decrease shortness of breath.              Oxygen Discharge (Final Oxygen Re-Evaluation):  Oxygen Re-Evaluation - 11/07/22 0929       Program Oxygen Prescription   Program Oxygen Prescription None      Home Oxygen   Home Oxygen Device None    Sleep Oxygen Prescription None    Home Exercise Oxygen Prescription None    Home Resting Oxygen Prescription None      Goals/Expected Outcomes   Short Term Goals To learn and demonstrate proper use of respiratory medications;To learn and understand importance of maintaining oxygen saturations>88%;To learn and understand importance of monitoring SPO2 with pulse oximeter and demonstrate accurate use of the pulse oximeter.;To learn and demonstrate proper pursed lip breathing techniques or other breathing techniques.     Long  Term Goals Maintenance of O2 saturations>88%;Compliance with  respiratory medication;Verbalizes importance of monitoring SPO2 with pulse oximeter and return demonstration;Exhibits proper breathing techniques, such as pursed lip breathing or other method taught during program session;Demonstrates proper use of MDI's    Goals/Expected Outcomes Compliance and understanding of oxygen saturations monitoring and breathing techniques to decrease shortness of breath.             Initial Exercise Prescription:  Initial Exercise Prescription - 08/18/22 1200       Date of Initial Exercise RX and Referring Provider   Date 08/18/22    Referring Provider Young    Expected Discharge Date 11/16/22      Treadmill   MPH 2    Grade 0    Minutes 15      Recumbant Elliptical   Level 1    RPM 20    Watts 40    Minutes 15      Prescription Details   Frequency (times per week) 2    Duration Progress to 30 minutes of continuous aerobic without signs/symptoms of physical distress      Intensity   Ratings of Perceived Exertion 11-13  Perceived Dyspnea 0-4      Progression   Progression Continue to progress workloads to maintain intensity without signs/symptoms of physical distress.      Resistance Training   Training Prescription Yes    Weight blue bands    Reps 10-15             Perform Capillary Blood Glucose checks as needed.  Exercise Prescription Changes:   Exercise Prescription Changes     Row Name 08/31/22 1536 09/19/22 1500 09/26/22 1513 10/12/22 1453 10/31/22 1500     Response to Exercise   Blood Pressure (Admit) 106/72 92/62 108/70 92/60 108/62   Blood Pressure (Exercise) -- 128/70 -- -- 118/71   Blood Pressure (Exit) 104/70 96/64 94/64  102/58 100/56   Heart Rate (Admit) 84 bpm 90 bpm 71 bpm 74 bpm 86 bpm   Heart Rate (Exercise) 107 bpm 107 bpm 112 bpm 117 bpm 121 bpm   Heart Rate (Exit) 81 bpm 80 bpm 76 bpm 81 bpm 82 bpm   Oxygen Saturation (Admit) 99 % 100 % 100 % 99 % 100 %   Oxygen Saturation (Exercise) 100 % 96 % 94 % 95  % 97 %   Oxygen Saturation (Exit) 100 % 97 % 100 % 97 % 98 %   Rating of Perceived Exertion (Exercise) 11 11 11 11 11    Perceived Dyspnea (Exercise) 1 1 1 1 1    Duration Continue with 30 min of aerobic exercise without signs/symptoms of physical distress. Continue with 30 min of aerobic exercise without signs/symptoms of physical distress. Continue with 30 min of aerobic exercise without signs/symptoms of physical distress. Continue with 30 min of aerobic exercise without signs/symptoms of physical distress. Continue with 30 min of aerobic exercise without signs/symptoms of physical distress.   Intensity THRR unchanged THRR unchanged THRR unchanged THRR unchanged THRR unchanged     Progression   Progression Continue to progress workloads to maintain intensity without signs/symptoms of physical distress. Continue to progress workloads to maintain intensity without signs/symptoms of physical distress. Continue to progress workloads to maintain intensity without signs/symptoms of physical distress. Continue to progress workloads to maintain intensity without signs/symptoms of physical distress. --     Paramedic Prescription Yes Yes Yes Yes Yes   Weight blue bands blue bands blue bands blue bands blue bands   Reps 10-15 10-15 10-15 10-15 10-15   Time 15 Minutes 15 Minutes 15 Minutes 10 Minutes 10 Minutes     Treadmill   MPH 2.5 2.5 2.5 2.5 2.7   Grade 1.5 1.5 2 2  3.4   Minutes 15 15 15 15 15    METs 3.43 3.43 3.6 3.4 4.37     Recumbant Elliptical   Level -- 3 3 4 4    Minutes 15 15 15 15 15    METs 4 3.9 3.9 4 3.6     Home Exercise Plan   Plans to continue exercise at -- Home (comment)  walking -- -- --   Frequency -- Add 1 additional day to program exercise sessions. -- -- --   Initial Home Exercises Provided -- 09/19/22 -- -- --    Row Name 11/14/22 1500             Response to Exercise   Blood Pressure (Admit) 92/70       Blood Pressure (Exercise) 124/68        Blood Pressure (Exit) 102/64       Heart Rate (Admit) 98 bpm  Heart Rate (Exercise) 122 bpm       Heart Rate (Exit) 93 bpm       Oxygen Saturation (Admit) 98 %       Oxygen Saturation (Exercise) 97 %       Oxygen Saturation (Exit) 100 %       Rating of Perceived Exertion (Exercise) 7       Perceived Dyspnea (Exercise) 1       Duration Continue with 30 min of aerobic exercise without signs/symptoms of physical distress.       Intensity THRR unchanged         Progression   Progression Continue to progress workloads to maintain intensity without signs/symptoms of physical distress.         Resistance Training   Training Prescription Yes       Weight blue bands       Reps 10-15       Time 10 Minutes         Treadmill   MPH 2.4       Grade 3.5       Minutes 15       METs 3.7                Exercise Comments:   Exercise Comments     Row Name 08/24/22 1643 09/19/22 1519         Exercise Comments Pt completed first day of exercise. Chau exercised for 15 min on the recumbent elliptical and treadmill. She averaged 2.8 METs at level 1 on the recumbent elliptical and 2.38 METs on the treadmill. She performed the warmup and cooldown standing without limitations. Koda tolerated her first day well. Discussed METs. Discussed with pt home exercise plan. She is currently walking at home 15-30 min 1-2 nonrehab days a week. Encouraged pt to continue and to add time as able, aiming for 30-60 min of walking given that she has weight loss goals as well. Pt agreed.               Exercise Goals and Review:   Exercise Goals     Row Name 08/18/22 1056 09/15/22 1006 10/11/22 1521 11/07/22 0924       Exercise Goals   Increase Physical Activity Yes Yes Yes Yes    Intervention Provide advice, education, support and counseling about physical activity/exercise needs.;Develop an individualized exercise prescription for aerobic and resistive training based on initial evaluation  findings, risk stratification, comorbidities and participant's personal goals. Provide advice, education, support and counseling about physical activity/exercise needs.;Develop an individualized exercise prescription for aerobic and resistive training based on initial evaluation findings, risk stratification, comorbidities and participant's personal goals. Provide advice, education, support and counseling about physical activity/exercise needs.;Develop an individualized exercise prescription for aerobic and resistive training based on initial evaluation findings, risk stratification, comorbidities and participant's personal goals. Provide advice, education, support and counseling about physical activity/exercise needs.;Develop an individualized exercise prescription for aerobic and resistive training based on initial evaluation findings, risk stratification, comorbidities and participant's personal goals.    Expected Outcomes Short Term: Attend rehab on a regular basis to increase amount of physical activity.;Long Term: Exercising regularly at least 3-5 days a week.;Long Term: Add in home exercise to make exercise part of routine and to increase amount of physical activity. Short Term: Attend rehab on a regular basis to increase amount of physical activity.;Long Term: Exercising regularly at least 3-5 days a week.;Long Term: Add in home exercise to make exercise part of  routine and to increase amount of physical activity. Short Term: Attend rehab on a regular basis to increase amount of physical activity.;Long Term: Exercising regularly at least 3-5 days a week.;Long Term: Add in home exercise to make exercise part of routine and to increase amount of physical activity. Short Term: Attend rehab on a regular basis to increase amount of physical activity.;Long Term: Exercising regularly at least 3-5 days a week.;Long Term: Add in home exercise to make exercise part of routine and to increase amount of physical  activity.    Increase Strength and Stamina Yes Yes Yes Yes    Intervention Provide advice, education, support and counseling about physical activity/exercise needs.;Develop an individualized exercise prescription for aerobic and resistive training based on initial evaluation findings, risk stratification, comorbidities and participant's personal goals. Provide advice, education, support and counseling about physical activity/exercise needs.;Develop an individualized exercise prescription for aerobic and resistive training based on initial evaluation findings, risk stratification, comorbidities and participant's personal goals. Provide advice, education, support and counseling about physical activity/exercise needs.;Develop an individualized exercise prescription for aerobic and resistive training based on initial evaluation findings, risk stratification, comorbidities and participant's personal goals. Provide advice, education, support and counseling about physical activity/exercise needs.;Develop an individualized exercise prescription for aerobic and resistive training based on initial evaluation findings, risk stratification, comorbidities and participant's personal goals.    Expected Outcomes Short Term: Increase workloads from initial exercise prescription for resistance, speed, and METs.;Short Term: Perform resistance training exercises routinely during rehab and add in resistance training at home;Long Term: Improve cardiorespiratory fitness, muscular endurance and strength as measured by increased METs and functional capacity ( ) Short Term: Increase workloads from initial exercise prescription for resistance, speed, and METs.;Short Term: Perform resistance training exercises routinely during rehab and add in resistance training at home;Long Term: Improve cardiorespiratory fitness, muscular endurance and strength as measured by increased METs and functional capacity ( ) Short Term: Increase workloads  from initial exercise prescription for resistance, speed, and METs.;Short Term: Perform resistance training exercises routinely during rehab and add in resistance training at home;Long Term: Improve cardiorespiratory fitness, muscular endurance and strength as measured by increased METs and functional capacity ( ) Short Term: Increase workloads from initial exercise prescription for resistance, speed, and METs.;Short Term: Perform resistance training exercises routinely during rehab and add in resistance training at home;Long Term: Improve cardiorespiratory fitness, muscular endurance and strength as measured by increased METs and functional capacity ( )    Able to understand and use rate of perceived exertion (RPE) scale Yes Yes Yes Yes    Intervention Provide education and explanation on how to use RPE scale Provide education and explanation on how to use RPE scale Provide education and explanation on how to use RPE scale Provide education and explanation on how to use RPE scale    Expected Outcomes Short Term: Able to use RPE daily in rehab to express subjective intensity level;Long Term:  Able to use RPE to guide intensity level when exercising independently Short Term: Able to use RPE daily in rehab to express subjective intensity level;Long Term:  Able to use RPE to guide intensity level when exercising independently Short Term: Able to use RPE daily in rehab to express subjective intensity level;Long Term:  Able to use RPE to guide intensity level when exercising independently Short Term: Able to use RPE daily in rehab to express subjective intensity level;Long Term:  Able to use RPE to guide intensity level when exercising independently    Able to understand and use Dyspnea  scale Yes Yes Yes Yes    Intervention Provide education and explanation on how to use Dyspnea scale Provide education and explanation on how to use Dyspnea scale Provide education and explanation on how to use Dyspnea scale  Provide education and explanation on how to use Dyspnea scale    Expected Outcomes Short Term: Able to use Dyspnea scale daily in rehab to express subjective sense of shortness of breath during exertion;Long Term: Able to use Dyspnea scale to guide intensity level when exercising independently Short Term: Able to use Dyspnea scale daily in rehab to express subjective sense of shortness of breath during exertion;Long Term: Able to use Dyspnea scale to guide intensity level when exercising independently Short Term: Able to use Dyspnea scale daily in rehab to express subjective sense of shortness of breath during exertion;Long Term: Able to use Dyspnea scale to guide intensity level when exercising independently Short Term: Able to use Dyspnea scale daily in rehab to express subjective sense of shortness of breath during exertion;Long Term: Able to use Dyspnea scale to guide intensity level when exercising independently    Knowledge and understanding of Target Heart Rate Range (THRR) Yes Yes Yes Yes    Intervention Provide education and explanation of THRR including how the numbers were predicted and where they are located for reference Provide education and explanation of THRR including how the numbers were predicted and where they are located for reference Provide education and explanation of THRR including how the numbers were predicted and where they are located for reference Provide education and explanation of THRR including how the numbers were predicted and where they are located for reference    Expected Outcomes Short Term: Able to state/look up THRR;Long Term: Able to use THRR to govern intensity when exercising independently;Short Term: Able to use daily as guideline for intensity in rehab Short Term: Able to state/look up THRR;Long Term: Able to use THRR to govern intensity when exercising independently;Short Term: Able to use daily as guideline for intensity in rehab Short Term: Able to state/look up  THRR;Long Term: Able to use THRR to govern intensity when exercising independently;Short Term: Able to use daily as guideline for intensity in rehab Short Term: Able to state/look up THRR;Long Term: Able to use THRR to govern intensity when exercising independently;Short Term: Able to use daily as guideline for intensity in rehab    Understanding of Exercise Prescription Yes Yes Yes Yes    Intervention Provide education, explanation, and written materials on patient's individual exercise prescription Provide education, explanation, and written materials on patient's individual exercise prescription Provide education, explanation, and written materials on patient's individual exercise prescription Provide education, explanation, and written materials on patient's individual exercise prescription    Expected Outcomes Short Term: Able to explain program exercise prescription;Long Term: Able to explain home exercise prescription to exercise independently Short Term: Able to explain program exercise prescription;Long Term: Able to explain home exercise prescription to exercise independently Short Term: Able to explain program exercise prescription;Long Term: Able to explain home exercise prescription to exercise independently Short Term: Able to explain program exercise prescription;Long Term: Able to explain home exercise prescription to exercise independently             Exercise Goals Re-Evaluation :  Exercise Goals Re-Evaluation     Row Name 09/15/22 1006 10/11/22 1522 11/07/22 0924         Exercise Goal Re-Evaluation   Exercise Goals Review Increase Physical Activity;Able to understand and use Dyspnea scale;Understanding of Exercise  Prescription;Increase Strength and Stamina;Knowledge and understanding of Target Heart Rate Range (THRR);Able to understand and use rate of perceived exertion (RPE) scale Increase Physical Activity;Able to understand and use Dyspnea scale;Understanding of Exercise  Prescription;Increase Strength and Stamina;Knowledge and understanding of Target Heart Rate Range (THRR);Able to understand and use rate of perceived exertion (RPE) scale Increase Physical Activity;Able to understand and use Dyspnea scale;Understanding of Exercise Prescription;Increase Strength and Stamina;Knowledge and understanding of Target Heart Rate Range (THRR);Able to understand and use rate of perceived exertion (RPE) scale     Comments Pt has completed 4 days of exercise. She has missed the last 2 sessions due to prior engagements. Christiane exercised for 15 min on the recumbent elliptical and treadmill. She averaged 3.8 METs at level 3 on the recumbent elliptical and 3.43 METs on the treadmill, 2.5 speed, 1.5 incline. She performed the warmup and cooldown standing without limitations. She is progressing and will monitor. Pt has completed 8 days of exercise. She has missed multiple sessions for various reasons, the last due to illness.  Siedah exercised for 15 min on the recumbent elliptical and treadmill. She averaged 3.7 METs at level 4 on the recumbent elliptical and 3.6 METs on the treadmill, 2.5 speed, 2 incline. She performed the warmup and cooldown standing without limitations. She is progressing and will monitor. Pt has completed 10 days of exercise. She has missed many sessions for various reasons. She is scheduled to graduate next week. Marisabel exercises for 15 min on the recumbent elliptical and treadmill. She averaged 3.6 METs at level 4 on the recumbent elliptical and 3.6 METs on the treadmill, 2.5 speed, 2 incline. She performed the warmup and cooldown standing without limitations. Her progress has plateaued, will increase resistance today.     Expected Outcomes Through exercise at rehab and at home, the patient will decrease shortness of breath with daily activities and feel confident in carrying out an exercise regime at home. Through exercise at rehab and at home, the patient will decrease  shortness of breath with daily activities and feel confident in carrying out an exercise regime at home. Through exercise at rehab and at home, the patient will decrease shortness of breath with daily activities and feel confident in carrying out an exercise regime at home.              Discharge Exercise Prescription (Final Exercise Prescription Changes):  Exercise Prescription Changes - 11/14/22 1500       Response to Exercise   Blood Pressure (Admit) 92/70    Blood Pressure (Exercise) 124/68    Blood Pressure (Exit) 102/64    Heart Rate (Admit) 98 bpm    Heart Rate (Exercise) 122 bpm    Heart Rate (Exit) 93 bpm    Oxygen Saturation (Admit) 98 %    Oxygen Saturation (Exercise) 97 %    Oxygen Saturation (Exit) 100 %    Rating of Perceived Exertion (Exercise) 7    Perceived Dyspnea (Exercise) 1    Duration Continue with 30 min of aerobic exercise without signs/symptoms of physical distress.    Intensity THRR unchanged      Progression   Progression Continue to progress workloads to maintain intensity without signs/symptoms of physical distress.      Resistance Training   Training Prescription Yes    Weight blue bands    Reps 10-15    Time 10 Minutes      Treadmill   MPH 2.4    Grade 3.5    Minutes  15    METs 3.7             Nutrition:  Target Goals: Understanding of nutrition guidelines, daily intake of sodium 1500mg , cholesterol 200mg , calories 30% from fat and 7% or less from saturated fats, daily to have 5 or more servings of fruits and vegetables.  Biometrics:  Pre Biometrics - 08/18/22 1206       Pre Biometrics   Grip Strength 20 kg             Post Biometrics - 11/14/22 1534        Post  Biometrics   Grip Strength 23 kg             Nutrition Therapy Plan and Nutrition Goals:  Nutrition Therapy & Goals - 10/19/22 1411       Nutrition Therapy   Diet General Healthy Diet      Personal Nutrition Goals   Nutrition Goal Patient to  improve diet quality by using the plate method as a guide for meal planning to include lean protein/plant protein, fruits, vegetables, whole grains, nonfat dairy as part of a well-balanced diet.    Comments Attendance remains variable; Deziray has attended 9 sessions. Frankye reports following a vegetarian diet for the last 5 years; her primary protein sources are beans/legumes and Ensure/Boost. Patient is up ~2# since starting with our program. Ashlynn will benefit from participation in pulmonary rehab for nutrition,exercise,and lifestyle modification.      Intervention Plan   Intervention Prescribe, educate and counsel regarding individualized specific dietary modifications aiming towards targeted core components such as weight, hypertension, lipid management, diabetes, heart failure and other comorbidities.;Nutrition handout(s) given to patient.    Expected Outcomes Short Term Goal: Understand basic principles of dietary content, such as calories, fat, sodium, cholesterol and nutrients.;Long Term Goal: Adherence to prescribed nutrition plan.             Nutrition Assessments:  Nutrition Assessments - 08/24/22 1536       Rate Your Plate Scores   Pre Score 59            MEDIFICTS Score Key: ?70 Need to make dietary changes  40-70 Heart Healthy Diet ? 40 Therapeutic Level Cholesterol Diet  Flowsheet Row PULMONARY REHAB CHRONIC OBSTRUCTIVE PULMONARY DISEASE from 08/24/2022 in [facility]  Picture Your Plate Total Score on Admission 59      Picture Your Plate Scores: <16 Unhealthy dietary pattern with much room for improvement. 41-50 Dietary pattern unlikely to meet recommendations for good health and room for improvement. 51-60 More healthful dietary pattern, with some room for improvement.  >60 Healthy dietary pattern, although there may be some specific behaviors that could be improved.    Nutrition Goals Re-Evaluation:   Nutrition Goals Re-Evaluation     Row Name 08/24/22 1501 09/19/22 1439 10/19/22 1411         Goals   Current Weight 208 lb 15.9 oz (94.8 kg) 208 lb 15.9 oz (94.8 kg) 210 lb 8.6 oz (95.5 kg)  wt from last attended session on 6/6     Comment Lipid panel (2020) WNL, no new labs. no new labs     Expected Outcome Addysen reports following a vegetarian diet for the last 5 years; her primary protein sources are beans/legumes and Ensure/Boost. Ceirra will benefit from participation in pulmonary rehab for nutrition,exercise,and lifestyle modification. Shona reports following a vegetarian diet for the last 5 years; her primary  protein sources are beans/legumes and Ensure/Boost. She has maintained her weight since starting with our program. Chevy will benefit from participation in pulmonary rehab for nutrition,exercise,and lifestyle modification. Attendance remains variable; Allaya has attended 9 sessions. Neville reports following a vegetarian diet for the last 5 years; her primary protein sources are beans/legumes and Ensure/Boost. Patient is up ~2# since starting with our program. Shaday will benefit from participation in pulmonary rehab for nutrition,exercise,and lifestyle modification.              Nutrition Goals Discharge (Final Nutrition Goals Re-Evaluation):  Nutrition Goals Re-Evaluation - 10/19/22 1411       Goals   Current Weight 210 lb 8.6 oz (95.5 kg)   wt from last attended session on 6/6   Comment no new labs    Expected Outcome Attendance remains variable; Pa has attended 9 sessions. Jadia reports following a vegetarian diet for the last 5 years; her primary protein sources are beans/legumes and Ensure/Boost. Patient is up ~2# since starting with our program. Dandra will benefit from participation in pulmonary rehab for nutrition,exercise,and lifestyle modification.             Psychosocial: Target Goals: Acknowledge presence or absence of significant depression and/or stress, maximize coping  skills, provide positive support system. Participant is able to verbalize types and ability to use techniques and skills needed for reducing stress and depression.  Initial Review & Psychosocial Screening:  Initial Psych Review & Screening - 08/18/22 1052       Initial Review   Current issues with None Identified      Family Dynamics   Good Support System? Yes      Barriers   Psychosocial barriers to participate in program There are no identifiable barriers or psychosocial needs.      Screening Interventions   Interventions Encouraged to exercise             Quality of Life Scores:  Scores of 19 and below usually indicate a poorer quality of life in these areas.  A difference of  2-3 points is a clinically meaningful difference.  A difference of 2-3 points in the total score of the Quality of Life Index has been associated with significant improvement in overall quality of life, self-image, physical symptoms, and general health in studies assessing change in quality of life.  PHQ-9: Review Flowsheet       08/18/2022 06/02/2019 07/05/2018  Depression screen PHQ 2/9  Decreased Interest 0 0 0  Down, Depressed, Hopeless 0 0 0  PHQ - 2 Score 0 0 0  Altered sleeping 1 - -  Tired, decreased energy 0 - -  Change in appetite 2 - -  Feeling bad or failure about yourself  0 - -  Trouble concentrating 0 - -  Moving slowly or fidgety/restless 0 - -  Suicidal thoughts 0 - -  PHQ-9 Score 3 - -  Difficult doing work/chores Somewhat difficult - -   Interpretation of Total Score  Total Score Depression Severity:  1-4 = Minimal depression, 5-9 = Mild depression, 10-14 = Moderate depression, 15-19 = Moderately severe depression, 20-27 = Severe depression   Psychosocial Evaluation and Intervention:  Psychosocial Evaluation - 08/18/22 1053       Psychosocial Evaluation & Interventions   Interventions Encouraged to exercise with the program and follow exercise prescription    Comments  Pt denies any psychosocial barriers or concerns at this time    Expected Outcomes For pt to participate in PR  free of any psychosocial barriers or concerns    Continue Psychosocial Services  No Follow up required             Psychosocial Re-Evaluation:  Psychosocial Re-Evaluation     Row Name 09/13/22 1404 10/09/22 0921 11/10/22 1615         Psychosocial Re-Evaluation   Current issues with None Identified None Identified None Identified     Comments Kortnee denies any psychosocial barriers or concerns at this time. Carleah denies any psychosocial barriers or concerns at this time. Calena denies any psychosocial barriers or concerns at this time.     Expected Outcomes For Breuna to participate in PR free of any psychosocial barriers or concerns. For Donyel to participate in PR free of any psychosocial barriers or concerns. For Ludy to participate in PR free of any psychosocial barriers or concerns.     Interventions Encouraged to attend Pulmonary Rehabilitation for the exercise Encouraged to attend Pulmonary Rehabilitation for the exercise Encouraged to attend Pulmonary Rehabilitation for the exercise     Continue Psychosocial Services  No Follow up required No Follow up required No Follow up required              Psychosocial Discharge (Final Psychosocial Re-Evaluation):  Psychosocial Re-Evaluation - 11/10/22 1615       Psychosocial Re-Evaluation   Current issues with None Identified    Comments Eldora denies any psychosocial barriers or concerns at this time.    Expected Outcomes For Tanisha to participate in PR free of any psychosocial barriers or concerns.    Interventions Encouraged to attend Pulmonary Rehabilitation for the exercise    Continue Psychosocial Services  No Follow up required             Education: Education Goals: Education classes will be provided on a weekly basis, covering required topics. Participant will state understanding/return demonstration of topics  presented.  Learning Barriers/Preferences:  Learning Barriers/Preferences - 08/18/22 1054       Learning Barriers/Preferences   Learning Barriers None    Learning Preferences None             Education Topics: Introduction to Pulmonary Rehab Group instruction provided by PowerPoint, verbal discussion, and written material to support subject matter. Instructor reviews what Pulmonary Rehab is, the purpose of the program, and how patients are referred.     Know Your Numbers Group instruction that is supported by a PowerPoint presentation. Instructor discusses importance of knowing and understanding resting, exercise, and post-exercise oxygen saturation, heart rate, and blood pressure. Oxygen saturation, heart rate, blood pressure, rating of perceived exertion, and dyspnea are reviewed along with a normal range for these values.    Exercise for the Pulmonary Patient Group instruction that is supported by a PowerPoint presentation. Instructor discusses benefits of exercise, core components of exercise, frequency, duration, and intensity of an exercise routine, importance of utilizing pulse oximetry during exercise, safety while exercising, and options of places to exercise outside of rehab.       MET Level  Group instruction provided by PowerPoint, verbal discussion, and written material to support subject matter. Instructor reviews what METs are and how to increase METs.  Flowsheet Row PULMONARY REHAB CHRONIC OBSTRUCTIVE PULMONARY DISEASE from 10/12/2022 in [facility]  Date 10/12/22  Educator EP  Instruction Review Code 1- Verbalizes Understanding       Pulmonary Medications Verbally interactive group education provided by instructor with focus on inhaled  medications and proper administration.   Anatomy and Physiology of the Respiratory System Group instruction provided by PowerPoint, verbal discussion, and written  material to support subject matter. Instructor reviews respiratory cycle and anatomical components of the respiratory system and their functions. Instructor also reviews differences in obstructive and restrictive respiratory diseases with examples of each.    Oxygen Safety Group instruction provided by PowerPoint, verbal discussion, and written material to support subject matter. There is an overview of "What is Oxygen" and "Why do we need it".  Instructor also reviews how to create a safe environment for oxygen use, the importance of using oxygen as prescribed, and the risks of noncompliance. There is a brief discussion on traveling with oxygen and resources the patient may utilize. Flowsheet Row PULMONARY REHAB CHRONIC OBSTRUCTIVE PULMONARY DISEASE from 08/24/2022 in [facility]  Date 08/24/22  Educator RN  Instruction Review Code 1- Verbalizes Understanding       Oxygen Use Group instruction provided by PowerPoint, verbal discussion, and written material to discuss how supplemental oxygen is prescribed and different types of oxygen supply systems. Resources for more information are provided.  Flowsheet Row PULMONARY REHAB CHRONIC OBSTRUCTIVE PULMONARY DISEASE from 08/31/2022 in [facility]  Date 08/31/22  Educator RT  Instruction Review Code 1- Verbalizes Understanding       Breathing Techniques Group instruction that is supported by demonstration and informational handouts. Instructor discusses the benefits of pursed lip and diaphragmatic breathing and detailed demonstration on how to perform both.     Risk Factor Reduction Group instruction that is supported by a PowerPoint presentation. Instructor discusses the definition of a risk factor, different risk factors for pulmonary disease, and how the heart and lungs work together.   MD Day A group question and answer  session with a medical doctor that allows participants to ask questions that relate to their pulmonary disease state.   Nutrition for the Pulmonary Patient Group instruction provided by PowerPoint slides, verbal discussion, and written materials to support subject matter. The instructor gives an explanation and review of healthy diet recommendations, which includes a discussion on weight management, recommendations for fruit and vegetable consumption, as well as protein, fluid, caffeine, fiber, sodium, sugar, and alcohol. Tips for eating when patients are short of breath are discussed.    Other Education Group or individual verbal, written, or video instructions that support the educational goals of the pulmonary rehab program. Flowsheet Row PULMONARY REHAB CHRONIC OBSTRUCTIVE PULMONARY DISEASE from 09/21/2022 in [facility]  Date 09/21/22  Educator RN  Instruction Review Code 1- Verbalizes Understanding        Knowledge Questionnaire Score:  Knowledge Questionnaire Score - 08/18/22 1110       Knowledge Questionnaire Score   Pre Score 16/18             Core Components/Risk Factors/Patient Goals at Admission:  Personal Goals and Risk Factors at Admission - 08/18/22 1055       Core Components/Risk Factors/Patient Goals on Admission    Weight Management Weight Loss    Improve shortness of breath with ADL's Yes    Intervention Provide education, individualized exercise plan and daily activity instruction to help decrease symptoms of SOB with activities of daily living.    Expected Outcomes Short Term: Improve cardiorespiratory fitness to achieve a reduction of symptoms when performing ADLs;Long Term: Be  able to perform more ADLs without symptoms or delay the onset of symptoms    Increase knowledge of respiratory medications and ability to use respiratory devices properly  Yes    Intervention Provide education and  demonstration as needed of appropriate use of medications, inhalers, and oxygen therapy.    Expected Outcomes Short Term: Achieves understanding of medications use. Understands that oxygen is a medication prescribed by physician. Demonstrates appropriate use of inhaler and oxygen therapy.;Long Term: Maintain appropriate use of medications, inhalers, and oxygen therapy.             Core Components/Risk Factors/Patient Goals Review:   Goals and Risk Factor Review     Row Name 09/13/22 1407 10/09/22 0924 11/10/22 1616         Core Components/Risk Factors/Patient Goals Review   Personal Goals Review Weight Management/Obesity;Improve shortness of breath with ADL's;Develop more efficient breathing techniques such as purse lipped breathing and diaphragmatic breathing and practicing self-pacing with activity.;Increase knowledge of respiratory medications and ability to use respiratory devices properly. Weight Management/Obesity;Improve shortness of breath with ADL's Weight Management/Obesity;Improve shortness of breath with ADL's;Increase knowledge of respiratory medications and ability to use respiratory devices properly.     Review Aijah is doing well in PR class. She enjoy's coming to class and exercising. 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 Vertis has not met her weight loss goals, but is working with staff dietician to obtain her goal. She uses purse lip breathing when she becomes SOB. Mikalyn is able to report her rate of perceived exertion and dyspnea scale when asked by staff. Leyana is knowledgeable about her respiratory medications. We will continue to monitor Early's progress throughout the program. Stella is doing well in PR class. She enjoy's coming to class and exercising. Lacora has not met her weight loss goals, but is working with staff dietician to obtain her goal. She has met her goal for developing more efficient breathing  techniques as she uses purse lip breathing when she becomes SOB. Voula is able to report her rate of perceived exertion and dyspnea scale when asked by staff. Janyla is knowledgeable about her respiratory medications. Eowyn will benefit from participation in PR for nutrition, exercise and lifestyle modification. Goal in progress for weight loss. Attendance has not been great for [place]. She is currently up weight since starting the program. Goal met on developing more efficient breathing techniques such as purse lipped breathing and diaphragmatic breathing; and practicing self-pacing with activity. Goal in progress on improving her shortness of breath with ADLs. Goal met for increasing her knowledge of respiratory medications and ability to use respiratory devices properly. She has correctly demonstrated and voiced when to use her medications with our respiratory therapist. Lorri will continue to benefit from PR for nutrition, education, exercise and lifestyle modification.     Expected Outcomes See admission goals See admission goals See admission goals              Core Components/Risk Factors/Patient Goals at Discharge (Final Review):   Goals and Risk Factor Review - 11/10/22 1616       Core Components/Risk Factors/Patient Goals Review   Personal Goals Review Weight Management/Obesity;Improve shortness of breath with ADL's;Increase knowledge of respiratory medications and ability to use respiratory devices properly.    Review Goal in progress for weight loss. Attendance has not been great for [place]. She is currently up weight since starting the program. Goal met  on developing more efficient breathing techniques such as purse lipped breathing and diaphragmatic breathing; and practicing self-pacing with activity. Goal in progress on improving her shortness of breath with ADLs. Goal met for increasing her knowledge of respiratory medications and ability to use respiratory devices properly. She has correctly  demonstrated and voiced when to use her medications with our respiratory therapist. Lotus will continue to benefit from PR for nutrition, education, exercise and lifestyle modification.    Expected Outcomes See admission goals             ITP Comments: Pt is making expected progress toward Pulmonary Rehab goals after completing 11 sessions. Recommend continued exercise, life style modification, education, and utilization of breathing techniques to increase stamina and strength, while also decreasing shortness of breath with exertion.  Dr. Mechele Collin is Medical Director for Pulmonary Rehab at [facility].

## 2022-11-16 ENCOUNTER — Encounter (HOSPITAL_COMMUNITY): Payer: Commercial Managed Care - HMO

## 2022-11-16 NOTE — Progress Notes (Signed)
Discharge Progress Report  Patient Details  Name: Jocelyn Myers MRN: [id number] Date of Birth: [birth year] Referring Provider:   Doristine Devoid Pulmonary Rehab Walk Test from 08/18/2022 in [facility]  Referring Provider Young        Number of Visits: 11  Reason for Discharge:  Patient reached a stable level of exercise. Patient independent in their exercise. Patient has met program and personal goals.  Smoking History:  Social History   Tobacco Use  Smoking Status Never  Smokeless Tobacco Never    Diagnosis:  Stage 3 severe COPD by GOLD classification (HCC)  ADL [facility]:  Pulmonary Assessment Scores     Row Name 08/18/22 1107 11/14/22 1538       ADL [facility]   ADL Phase Entry Exit    SOB Score total 23 --      CAT Score   CAT Score 25 --      mMRC Score   mMRC Score 1 1             Initial Exercise Prescription:  Initial Exercise Prescription - 08/18/22 1200       Date of Initial Exercise RX and Referring Provider   Date 08/18/22    Referring Provider Young    Expected Discharge Date 11/16/22      Treadmill   MPH 2    Grade 0    Minutes 15      Recumbant Elliptical   Level 1    RPM 20    Watts 40    Minutes 15      Prescription Details   Frequency (times per week) 2    Duration Progress to 30 minutes of continuous aerobic without signs/symptoms of physical distress      Intensity   Ratings of Perceived Exertion 11-13    Perceived Dyspnea 0-4      Progression   Progression Continue to progress workloads to maintain intensity without signs/symptoms of physical distress.      Resistance Training   Training Prescription Yes    Weight blue bands    Reps 10-15             Discharge Exercise Prescription (Final Exercise Prescription Changes):  Exercise Prescription Changes - 11/14/22 1500       Response to Exercise   Blood Pressure (Admit) 92/70    Blood Pressure (Exercise)  124/68    Blood Pressure (Exit) 102/64    Heart Rate (Admit) 98 bpm    Heart Rate (Exercise) 122 bpm    Heart Rate (Exit) 93 bpm    Oxygen Saturation (Admit) 98 %    Oxygen Saturation (Exercise) 97 %    Oxygen Saturation (Exit) 100 %    Rating of Perceived Exertion (Exercise) 7    Perceived Dyspnea (Exercise) 1    Duration Continue with 30 min of aerobic exercise without signs/symptoms of physical distress.    Intensity THRR unchanged      Progression   Progression Continue to progress workloads to maintain intensity without signs/symptoms of physical distress.      Resistance Training   Training Prescription Yes    Weight blue bands    Reps 10-15    Time 10 Minutes      Treadmill   MPH 2.4    Grade 3.5    Minutes 15    METs 3.7             Functional Capacity:  6 Minute Walk     Row Name 08/18/22 1202 11/14/22 1534       6 Minute Walk   Phase Initial Discharge    Distance 1220 feet 1440 feet    Distance % Change -- 18.03 %    Distance Feet Change -- 220 ft    Walk Time 6 minutes 6 minutes    # of Rest Breaks 0 0    MPH 2.31 2.73    METS 3.06 3.34    RPE 11 11    Perceived Dyspnea  0 1    VO2 Peak 10.7 11.68    Symptoms No No    Resting HR 96 bpm 98 bpm    Resting BP 98/60 92/70    Resting Oxygen Saturation  98 % 98 %    Exercise Oxygen Saturation  during 6 min walk 93 % 97 %    Max Ex. HR 126 bpm 122 bpm    Max Ex. BP 122/74 124/68    2 Minute Post BP 98/74 110/72      Interval HR   1 Minute HR 111 111    2 Minute HR 108 114    3 Minute HR 110 117    4 Minute HR 110 119    5 Minute HR 112 120    6 Minute HR 126 122    2 Minute Post HR 100 102    Interval Heart Rate? Yes --      Interval Oxygen   Interval Oxygen? Yes --    Baseline Oxygen Saturation % 98 % 98 %    1 Minute Oxygen Saturation % 98 % 97 %    1 Minute Liters of Oxygen 0 L 0 L    2 Minute Oxygen Saturation % 93 % 97 %    2 Minute Liters of Oxygen 0 L 0 L    3 Minute Oxygen  Saturation % 96 % 97 %    3 Minute Liters of Oxygen 0 L 0 L    4 Minute Oxygen Saturation % 94 % 97 %    4 Minute Liters of Oxygen 0 L 0 L    5 Minute Oxygen Saturation % 96 % 97 %    5 Minute Liters of Oxygen 0 L 0 L    6 Minute Oxygen Saturation % 94 % 98 %    6 Minute Liters of Oxygen 0 L 0 L    2 Minute Post Oxygen Saturation % 100 % 99 %    2 Minute Post Liters of Oxygen 0 L 0 L             Psychological, QOL, Others - Outcomes: PHQ 2/9:    08/18/2022   11:11 AM 06/02/2019   10:57 AM 07/05/2018   10:45 AM  Depression screen PHQ 2/9  Decreased Interest 0 0 0  Down, Depressed, Hopeless 0 0 0  PHQ - 2 Score 0 0 0  Altered sleeping 1    Tired, decreased energy 0    Change in appetite 2    Feeling bad or failure about yourself  0    Trouble concentrating 0    Moving slowly or fidgety/restless 0    Suicidal thoughts 0    PHQ-9 Score 3    Difficult doing work/chores Somewhat difficult      Quality of Life:   Personal Goals: Goals established at orientation with interventions provided to work toward goal.  Personal Goals and Risk Factors  at Admission - 08/18/22 1055       Core Components/Risk Factors/Patient Goals on Admission    Weight Management Weight Loss    Improve shortness of breath with ADL's Yes    Intervention Provide education, individualized exercise plan and daily activity instruction to help decrease symptoms of SOB with activities of daily living.    Expected Outcomes Short Term: Improve cardiorespiratory fitness to achieve a reduction of symptoms when performing ADLs;Long Term: Be able to perform more ADLs without symptoms or delay the onset of symptoms    Increase knowledge of respiratory medications and ability to use respiratory devices properly  Yes    Intervention Provide education and demonstration as needed of appropriate use of medications, inhalers, and oxygen therapy.    Expected Outcomes Short Term: Achieves understanding of medications use.  Understands that oxygen is a medication prescribed by physician. Demonstrates appropriate use of inhaler and oxygen therapy.;Long Term: Maintain appropriate use of medications, inhalers, and oxygen therapy.              Personal Goals Discharge:  Goals and Risk Factor Review     Row Name 09/13/22 1407 10/09/22 0924 11/10/22 1616         Core Components/Risk Factors/Patient Goals Review   Personal Goals Review Weight Management/Obesity;Improve shortness of breath with ADL's;Develop more efficient breathing techniques such as purse lipped breathing and diaphragmatic breathing and practicing self-pacing with activity.;Increase knowledge of respiratory medications and ability to use respiratory devices properly. Weight Management/Obesity;Improve shortness of breath with ADL's Weight Management/Obesity;Improve shortness of breath with ADL's;Increase knowledge of respiratory medications and ability to use respiratory devices properly.     Review Georgi is doing well in PR class. She enjoy's coming to class and exercising. 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 Birdell has not met her weight loss goals, but is working with staff dietician to obtain her goal. She uses purse lip breathing when she becomes SOB. Sible is able to report her rate of perceived exertion and dyspnea scale when asked by staff. Orva is knowledgeable about her respiratory medications. We will continue to monitor Franceska's progress throughout the program. Ettamae is doing well in PR class. She enjoy's coming to class and exercising. Thomasa has not met her weight loss goals, but is working with staff dietician to obtain her goal. She has met her goal for developing more efficient breathing techniques as she uses purse lip breathing when she becomes SOB. Delcenia is able to report her rate of perceived exertion and dyspnea scale when asked by staff. Lilou is knowledgeable about her  respiratory medications. Emalina will benefit from participation in PR for nutrition, exercise and lifestyle modification. Goal in progress for weight loss. Attendance has not been great for Cantril. She is currently up weight since starting the program. Goal met on developing more efficient breathing techniques such as purse lipped breathing and diaphragmatic breathing; and practicing self-pacing with activity. Goal in progress on improving her shortness of breath with ADLs. Goal met for increasing her knowledge of respiratory medications and ability to use respiratory devices properly. She has correctly demonstrated and voiced when to use her medications with our respiratory therapist. Shenicka will continue to benefit from PR for nutrition, education, exercise and lifestyle modification.     Expected Outcomes See admission goals See admission goals See admission goals              Exercise Goals and  Review:  Exercise Goals     Row Name 08/18/22 1056 09/15/22 1006 10/11/22 1521 11/07/22 0924       Exercise Goals   Increase Physical Activity Yes Yes Yes Yes    Intervention Provide advice, education, support and counseling about physical activity/exercise needs.;Develop an individualized exercise prescription for aerobic and resistive training based on initial evaluation findings, risk stratification, comorbidities and participant's personal goals. Provide advice, education, support and counseling about physical activity/exercise needs.;Develop an individualized exercise prescription for aerobic and resistive training based on initial evaluation findings, risk stratification, comorbidities and participant's personal goals. Provide advice, education, support and counseling about physical activity/exercise needs.;Develop an individualized exercise prescription for aerobic and resistive training based on initial evaluation findings, risk stratification, comorbidities and participant's personal goals. Provide  advice, education, support and counseling about physical activity/exercise needs.;Develop an individualized exercise prescription for aerobic and resistive training based on initial evaluation findings, risk stratification, comorbidities and participant's personal goals.    Expected Outcomes Short Term: Attend rehab on a regular basis to increase amount of physical activity.;Long Term: Exercising regularly at least 3-5 days a week.;Long Term: Add in home exercise to make exercise part of routine and to increase amount of physical activity. Short Term: Attend rehab on a regular basis to increase amount of physical activity.;Long Term: Exercising regularly at least 3-5 days a week.;Long Term: Add in home exercise to make exercise part of routine and to increase amount of physical activity. Short Term: Attend rehab on a regular basis to increase amount of physical activity.;Long Term: Exercising regularly at least 3-5 days a week.;Long Term: Add in home exercise to make exercise part of routine and to increase amount of physical activity. Short Term: Attend rehab on a regular basis to increase amount of physical activity.;Long Term: Exercising regularly at least 3-5 days a week.;Long Term: Add in home exercise to make exercise part of routine and to increase amount of physical activity.    Increase Strength and Stamina Yes Yes Yes Yes    Intervention Provide advice, education, support and counseling about physical activity/exercise needs.;Develop an individualized exercise prescription for aerobic and resistive training based on initial evaluation findings, risk stratification, comorbidities and participant's personal goals. Provide advice, education, support and counseling about physical activity/exercise needs.;Develop an individualized exercise prescription for aerobic and resistive training based on initial evaluation findings, risk stratification, comorbidities and participant's personal goals. Provide advice,  education, support and counseling about physical activity/exercise needs.;Develop an individualized exercise prescription for aerobic and resistive training based on initial evaluation findings, risk stratification, comorbidities and participant's personal goals. Provide advice, education, support and counseling about physical activity/exercise needs.;Develop an individualized exercise prescription for aerobic and resistive training based on initial evaluation findings, risk stratification, comorbidities and participant's personal goals.    Expected Outcomes Short Term: Increase workloads from initial exercise prescription for resistance, speed, and METs.;Short Term: Perform resistance training exercises routinely during rehab and add in resistance training at home;Long Term: Improve cardiorespiratory fitness, muscular endurance and strength as measured by increased METs and functional capacity ( ) Short Term: Increase workloads from initial exercise prescription for resistance, speed, and METs.;Short Term: Perform resistance training exercises routinely during rehab and add in resistance training at home;Long Term: Improve cardiorespiratory fitness, muscular endurance and strength as measured by increased METs and functional capacity ( ) Short Term: Increase workloads from initial exercise prescription for resistance, speed, and METs.;Short Term: Perform resistance training exercises routinely during rehab and add in resistance training at home;Long Term: Improve cardiorespiratory fitness,  muscular endurance and strength as measured by increased METs and functional capacity ( ) Short Term: Increase workloads from initial exercise prescription for resistance, speed, and METs.;Short Term: Perform resistance training exercises routinely during rehab and add in resistance training at home;Long Term: Improve cardiorespiratory fitness, muscular endurance and strength as measured by increased METs and functional  capacity ( )    Able to understand and use rate of perceived exertion (RPE) scale Yes Yes Yes Yes    Intervention Provide education and explanation on how to use RPE scale Provide education and explanation on how to use RPE scale Provide education and explanation on how to use RPE scale Provide education and explanation on how to use RPE scale    Expected Outcomes Short Term: Able to use RPE daily in rehab to express subjective intensity level;Long Term:  Able to use RPE to guide intensity level when exercising independently Short Term: Able to use RPE daily in rehab to express subjective intensity level;Long Term:  Able to use RPE to guide intensity level when exercising independently Short Term: Able to use RPE daily in rehab to express subjective intensity level;Long Term:  Able to use RPE to guide intensity level when exercising independently Short Term: Able to use RPE daily in rehab to express subjective intensity level;Long Term:  Able to use RPE to guide intensity level when exercising independently    Able to understand and use Dyspnea scale Yes Yes Yes Yes    Intervention Provide education and explanation on how to use Dyspnea scale Provide education and explanation on how to use Dyspnea scale Provide education and explanation on how to use Dyspnea scale Provide education and explanation on how to use Dyspnea scale    Expected Outcomes Short Term: Able to use Dyspnea scale daily in rehab to express subjective sense of shortness of breath during exertion;Long Term: Able to use Dyspnea scale to guide intensity level when exercising independently Short Term: Able to use Dyspnea scale daily in rehab to express subjective sense of shortness of breath during exertion;Long Term: Able to use Dyspnea scale to guide intensity level when exercising independently Short Term: Able to use Dyspnea scale daily in rehab to express subjective sense of shortness of breath during exertion;Long Term: Able to use  Dyspnea scale to guide intensity level when exercising independently Short Term: Able to use Dyspnea scale daily in rehab to express subjective sense of shortness of breath during exertion;Long Term: Able to use Dyspnea scale to guide intensity level when exercising independently    Knowledge and understanding of Target Heart Rate Range (THRR) Yes Yes Yes Yes    Intervention Provide education and explanation of THRR including how the numbers were predicted and where they are located for reference Provide education and explanation of THRR including how the numbers were predicted and where they are located for reference Provide education and explanation of THRR including how the numbers were predicted and where they are located for reference Provide education and explanation of THRR including how the numbers were predicted and where they are located for reference    Expected Outcomes Short Term: Able to state/look up THRR;Long Term: Able to use THRR to govern intensity when exercising independently;Short Term: Able to use daily as guideline for intensity in rehab Short Term: Able to state/look up THRR;Long Term: Able to use THRR to govern intensity when exercising independently;Short Term: Able to use daily as guideline for intensity in rehab Short Term: Able to state/look up THRR;Long Term: Able to use  THRR to govern intensity when exercising independently;Short Term: Able to use daily as guideline for intensity in rehab Short Term: Able to state/look up THRR;Long Term: Able to use THRR to govern intensity when exercising independently;Short Term: Able to use daily as guideline for intensity in rehab    Understanding of Exercise Prescription Yes Yes Yes Yes    Intervention Provide education, explanation, and written materials on patient's individual exercise prescription Provide education, explanation, and written materials on patient's individual exercise prescription Provide education, explanation, and written  materials on patient's individual exercise prescription Provide education, explanation, and written materials on patient's individual exercise prescription    Expected Outcomes Short Term: Able to explain program exercise prescription;Long Term: Able to explain home exercise prescription to exercise independently Short Term: Able to explain program exercise prescription;Long Term: Able to explain home exercise prescription to exercise independently Short Term: Able to explain program exercise prescription;Long Term: Able to explain home exercise prescription to exercise independently Short Term: Able to explain program exercise prescription;Long Term: Able to explain home exercise prescription to exercise independently             Exercise Goals Re-Evaluation:  Exercise Goals Re-Evaluation     Row Name 09/15/22 1006 10/11/22 1522 11/07/22 0924         Exercise Goal Re-Evaluation   Exercise Goals Review Increase Physical Activity;Able to understand and use Dyspnea scale;Understanding of Exercise Prescription;Increase Strength and Stamina;Knowledge and understanding of Target Heart Rate Range (THRR);Able to understand and use rate of perceived exertion (RPE) scale Increase Physical Activity;Able to understand and use Dyspnea scale;Understanding of Exercise Prescription;Increase Strength and Stamina;Knowledge and understanding of Target Heart Rate Range (THRR);Able to understand and use rate of perceived exertion (RPE) scale Increase Physical Activity;Able to understand and use Dyspnea scale;Understanding of Exercise Prescription;Increase Strength and Stamina;Knowledge and understanding of Target Heart Rate Range (THRR);Able to understand and use rate of perceived exertion (RPE) scale     Comments Pt has completed 4 days of exercise. She has missed the last 2 sessions due to prior engagements. Makinley exercised for 15 min on the recumbent elliptical and treadmill. She averaged 3.8 METs at level 3 on the  recumbent elliptical and 3.43 METs on the treadmill, 2.5 speed, 1.5 incline. She performed the warmup and cooldown standing without limitations. She is progressing and will monitor. Pt has completed 8 days of exercise. She has missed multiple sessions for various reasons, the last due to illness.  Jagger exercised for 15 min on the recumbent elliptical and treadmill. She averaged 3.7 METs at level 4 on the recumbent elliptical and 3.6 METs on the treadmill, 2.5 speed, 2 incline. She performed the warmup and cooldown standing without limitations. She is progressing and will monitor. Pt has completed 10 days of exercise. She has missed many sessions for various reasons. She is scheduled to graduate next week. Kennah exercises for 15 min on the recumbent elliptical and treadmill. She averaged 3.6 METs at level 4 on the recumbent elliptical and 3.6 METs on the treadmill, 2.5 speed, 2 incline. She performed the warmup and cooldown standing without limitations. Her progress has plateaued, will increase resistance today.     Expected Outcomes Through exercise at rehab and at home, the patient will decrease shortness of breath with daily activities and feel confident in carrying out an exercise regime at home. Through exercise at rehab and at home, the patient will decrease shortness of breath with daily activities and feel confident in carrying out an exercise regime  at home. Through exercise at rehab and at home, the patient will decrease shortness of breath with daily activities and feel confident in carrying out an exercise regime at home.              Nutrition & Weight - Outcomes:  Pre Biometrics - 08/18/22 1206       Pre Biometrics   Grip Strength 20 kg             Post Biometrics - 11/14/22 1534        Post  Biometrics   Grip Strength 23 kg             Nutrition:  Nutrition Therapy & Goals - 10/19/22 1411       Nutrition Therapy   Diet General Healthy Diet      Personal Nutrition  Goals   Nutrition Goal Patient to improve diet quality by using the plate method as a guide for meal planning to include lean protein/plant protein, fruits, vegetables, whole grains, nonfat dairy as part of a well-balanced diet.    Comments Attendance remains variable; Gianella has attended 9 sessions. Juanice reports following a vegetarian diet for the last 5 years; her primary protein sources are beans/legumes and Ensure/Boost. Patient is up ~2# since starting with our program. Teonna will benefit from participation in pulmonary rehab for nutrition,exercise,and lifestyle modification.      Intervention Plan   Intervention Prescribe, educate and counsel regarding individualized specific dietary modifications aiming towards targeted core components such as weight, hypertension, lipid management, diabetes, heart failure and other comorbidities.;Nutrition handout(s) given to patient.    Expected Outcomes Short Term Goal: Understand basic principles of dietary content, such as calories, fat, sodium, cholesterol and nutrients.;Long Term Goal: Adherence to prescribed nutrition plan.             Nutrition Discharge:  Nutrition Assessments - 08/24/22 1536       Rate Your Plate Scores   Pre Score 59             Education Questionnaire Score:  Knowledge Questionnaire Score - 08/18/22 1110       Knowledge Questionnaire Score   Pre Score 16/18             Goals reviewed with patient; copy given to patient.

## 2022-11-17 ENCOUNTER — Other Ambulatory Visit: Payer: Self-pay

## 2022-11-17 ENCOUNTER — Other Ambulatory Visit: Payer: Self-pay | Admitting: Internal Medicine

## 2022-11-17 DIAGNOSIS — J4489 Other specified chronic obstructive pulmonary disease: Secondary | ICD-10-CM

## 2022-11-18 NOTE — Progress Notes (Signed)
HPI female never smoker/+ secondhand, followed for COPD with asthma, probably chronic fixed asthma PFT 03/10/2014-severe obstructive airways disease, no response to BD, normal TLC, normal DLCO. FVC 2.22/84%, FEV1 0.95/44%, ratio 0.43 Office Spirometry-08/02/2015-severe obstructive airways disease.(Done after Xopenex nebulizer treatment) FVC 2.58/102%, FEV1 1.00/47%, FEV1/FVC 0.39, 25-75 0.41/14% ECHO- 06/23/16- EF 65-70 percent a1AT- WNL 124 MM 07/10/12 Allergy Profile 12/05/13-total IgE normal 101 with minimal elevations for dust mite and [place] pollen. CBC  normal eosinophils Office Spirometry 07/04/16- moderate obstructive airways disease-FVC 2.59/100%, FEV1 1.31/63%, ratio 0.51, FEF 25-75% 0.69/30% PFT 04/21/22-severe obstructive airways disease insignificant response to bronchodilator normal diffusion -------------------------------------------------------------------------------------------------------. .  07/21/22- 57 year old female never smoker/+ secondhand, followed for Asthma/COPD Overlap, allergic rhinitis, Eczema, obesity  -Trelegy 200, singulair, ProAir hfa, Neb Duoneb, flonase, Dupixent         Covid vax-3 Phizer Flu vax- ED 05/24/22- Short of breath ED 06/28/22- COPD exacerb> prednisone -------Pt states she is wheezing and has productive cough with bright yellow-greenish yellow, and sob. Symptoms have been present over the last few months We discussed recent exacerbations and current status.  Using nebulizer about once daily.  Easy dyspnea on exertion.  Cough variably productive with white to trace yellow-green sputum, no blood and no fever. I suggested we refer her to pulmonary rehabilitation.  She would benefit from this if she can schedule.  Currently she is taking accounting classes.  Needs refill of Trelegy.  Has now had 4 rounds of Dupixent.  We discussed continuing this perhaps for 6 months before final decision about effectiveness. CXR 06/28/22- 1V  The heart size and  mediastinal contours are within normal limits. Both lungs are clear. The visualized skeletal structures are unremarkable. IMPRESSION: No active disease.   11/20/22- 57 year old female never smoker/+ secondhand, followed for Asthma/COPD Overlap, allergic rhinitis, Eczema, obesity  - singulair, ProAir hfa, Neb Duoneb, flonase, Dupixent  Pulmonary Rehab-for severe COPD stage 3 -----Breathing has not been the best ACT 13 Insurance won't cover Trelegy or [facility] She has not been using a maintenance controller recently-discussed.  Dupixent seems to be helpful and will be continued.  She is having more difficulty with her breathing and describes much cough with yellow sputum.  Finds it hard to lie down at first at bedtime each night and has to prop up.  Nebulizer helps some. We are going to try samples of Breztri and if helpful she will check on insurance coverage for that.  ROS-see HPI   + = positive Constitutional:   No-   weight loss, night sweats, fevers, chills, fatigue, lassitude. HEENT:   No-  headaches, difficulty swallowing, tooth/dental problems, +sore throat,       sneezing, itching, ear ache, +nasal congestion, post nasal drip,  CV:  No-   chest pain, orthopnea, PND, swelling in lower extremities, anasarca, dizziness, palpitations Resp: +  shortness of breath with exertion or at rest.               +productive cough, + non-productive cough,  No- coughing up of blood.              No-   change in color of mucus.  + wheezing.   Skin: No-   rash or lesions. GI:  No-   heartburn, indigestion, abdominal pain, nausea, vomiting,  GU: n. MS:  No-   joint pain or swelling.   Neuro-     nothing unusual Psych:  No- change in mood or affect. No depression or anxiety.  No memory loss.  OBJ-  Physical Exam    General- Alert, Oriented, Affect-appropriate, Distress- none acute, + overweight Skin- rash-none, lesions- none, excoriation- none Lymphadenopathy- none Head- atraumatic            Eyes-  Gross vision intact, PERRLA, conjunctivae and secretions clear            Ears- Hearing, canals-normal            Nose- Clear, no-Septal dev, mucus, polyps, erosion, perforation. + nasal crease             Throat- Mallampati III , mucosa-, drainage- none, tonsils- atrophic, own teeth Neck- flexible , trachea midline, no stridor , thyroid nl, carotid no bruit Chest - symmetrical excursion , unlabored           Heart/CV- RRR , no murmur , no gallop  , no rub, nl s1 s2                           - JVD- none , edema- none, stasis changes- none, varices- none           Lung-  +minimal coarse sounds in bases, wheeze+, cough-none, dullness-none, rub- none           Chest wall-  Abd-  Br/ Gen/ Rectal- Not done, not indicated Extrem- cyanosis- none, clubbing, none, atrophy- none, strength- nl Neuro- grossly intact to observation

## 2022-11-20 ENCOUNTER — Ambulatory Visit: Payer: Commercial Managed Care - HMO | Admitting: Internal Medicine

## 2022-11-20 ENCOUNTER — Other Ambulatory Visit (HOSPITAL_COMMUNITY): Payer: Self-pay

## 2022-11-20 ENCOUNTER — Other Ambulatory Visit: Payer: Self-pay | Admitting: Internal Medicine

## 2022-11-20 ENCOUNTER — Encounter: Payer: Self-pay | Admitting: Internal Medicine

## 2022-11-20 VITALS — BP 112/74 | HR 87 | Ht 61.5 in | Wt 213.0 lb

## 2022-11-20 DIAGNOSIS — E669 Obesity, unspecified: Secondary | ICD-10-CM | POA: Diagnosis not present

## 2022-11-20 DIAGNOSIS — J4489 Other specified chronic obstructive pulmonary disease: Secondary | ICD-10-CM | POA: Diagnosis not present

## 2022-11-20 MED ORDER — DOXYCYCLINE HYCLATE 100 MG PO TABS: 100.0000 mg | ORAL_TABLET | Freq: Two times a day (BID) | ORAL | 0 refills | Status: DC

## 2022-11-20 NOTE — Assessment & Plan Note (Signed)
Exacerbation.  Discolored sputum suggest role for an antibiotic now.  We do need to get her established again with a maintenance controller. Plan-doxycycline, samples of Breztri

## 2022-11-20 NOTE — Patient Instructions (Addendum)
Script sent for doxycycline antibiotic  Order- sample x 2 Breztri maintenance inhaler   Inhale 2 puffs then rinse mouth, twice every day  If you like the Breztri, ask your pharmacist if your insurance would cover it..  Try otc nasal saline gel- any brand   for dry nose

## 2022-11-20 NOTE — Assessment & Plan Note (Signed)
Diet and exercise life-style emphasis to get her weight down

## 2022-11-21 ENCOUNTER — Other Ambulatory Visit (HOSPITAL_COMMUNITY): Payer: Self-pay

## 2022-11-21 ENCOUNTER — Other Ambulatory Visit: Payer: Self-pay

## 2022-11-21 MED ORDER — DUPIXENT 300 MG/2ML ~~LOC~~ SOAJ
300.0000 mg | SUBCUTANEOUS | 1 refills | Status: DC
  Filled 2022-11-21: qty 4, 28d supply, fill #0
  Filled 2022-12-14: qty 4, 28d supply, fill #1
  Filled 2023-01-18: qty 4, 28d supply, fill #2
  Filled 2023-02-16: qty 4, 28d supply, fill #3
  Filled 2023-03-14: qty 4, 28d supply, fill #4
  Filled 2023-04-12: qty 4, 28d supply, fill #5

## 2022-11-21 NOTE — Telephone Encounter (Signed)
Refill sent for DUPIXENT to [facility]: [phone]   Dose: 300 mg SQ every 2 weeks  Last OV: 11/20/22 Provider: Dr. Maple Hudson  Next OV: 03/23/23  Chesley Mires, PharmD, MPH, BCPS Clinical Pharmacist (Rheumatology and Pulmonology)

## 2022-12-14 ENCOUNTER — Other Ambulatory Visit (HOSPITAL_COMMUNITY): Payer: Self-pay

## 2022-12-14 ENCOUNTER — Telehealth: Payer: Self-pay | Admitting: Internal Medicine

## 2022-12-14 NOTE — Telephone Encounter (Signed)
Pt needs a refill for Breztri inhaler  Pharmacy: [facility] on [address]

## 2022-12-18 NOTE — Telephone Encounter (Signed)
Per rx order: Budeson-Glycopyrrol-Formoterol (BREZTRI AEROSPHERE) 160-9-4.8 MCG/ACT AERO is not on the preferred formulary for the patient's insurance plan. Below are alternatives which are likely to be more affordable.  Trelegy is preferred Tier 1  Please advise, thank you!

## 2022-12-18 NOTE — Telephone Encounter (Signed)
Cancel Breztri. Offer Trelegy 100, # 1, inhale 1 puff then rinse mouth, once daily, ref x 5

## 2022-12-19 ENCOUNTER — Other Ambulatory Visit: Payer: Self-pay

## 2022-12-19 NOTE — Telephone Encounter (Signed)
Patient checking on message for inhaler. Patient phone number is [phone].

## 2022-12-20 MED ORDER — TRELEGY ELLIPTA 100-62.5-25 MCG/ACT IN AEPB: 1.0000 | INHALATION_SPRAY | Freq: Every day | RESPIRATORY_TRACT | 5 refills | Status: DC

## 2022-12-20 NOTE — Telephone Encounter (Signed)
Patient aware of med change. Rx sent.

## 2023-01-18 ENCOUNTER — Other Ambulatory Visit (HOSPITAL_COMMUNITY): Payer: Self-pay

## 2023-01-23 ENCOUNTER — Other Ambulatory Visit: Payer: Self-pay

## 2023-02-16 ENCOUNTER — Other Ambulatory Visit: Payer: Self-pay

## 2023-02-16 NOTE — Progress Notes (Signed)
Specialty Pharmacy Refill Coordination Note  Jocelyn Myers is a 57 y.o. female contacted today regarding refills of specialty medication(s) Dupilumab   Patient requested Delivery   Delivery date: 02/22/23   Verified address: [address], [place], [address]   Medication will be filled on 02/21/23.

## 2023-03-14 ENCOUNTER — Other Ambulatory Visit (HOSPITAL_COMMUNITY): Payer: Self-pay

## 2023-03-14 ENCOUNTER — Other Ambulatory Visit: Payer: Self-pay

## 2023-03-14 ENCOUNTER — Other Ambulatory Visit (HOSPITAL_COMMUNITY): Payer: Self-pay | Admitting: Pharmacy Technician

## 2023-03-14 NOTE — Progress Notes (Signed)
Specialty Pharmacy Refill Coordination Note  Jocelyn Myers is a 57 y.o. female contacted today regarding refills of specialty medication(s) Dupilumab   Patient requested Delivery   Delivery date: 03/22/23   Verified address: [address] [place]   Medication will be filled on 03/21/23.

## 2023-03-21 ENCOUNTER — Other Ambulatory Visit: Payer: Self-pay

## 2023-03-23 ENCOUNTER — Ambulatory Visit: Payer: Commercial Managed Care - HMO | Admitting: Internal Medicine

## 2023-04-10 ENCOUNTER — Telehealth: Payer: Self-pay | Admitting: Internal Medicine

## 2023-04-10 NOTE — Telephone Encounter (Signed)
Patient needs a refill of prednisone.

## 2023-04-11 ENCOUNTER — Emergency Department (HOSPITAL_BASED_OUTPATIENT_CLINIC_OR_DEPARTMENT_OTHER)
Admission: EM | Admit: 2023-04-11 | Discharge: 2023-04-11 | Disposition: A | Discharge status: Home / Self Care | Payer: Commercial Managed Care - HMO | Attending: Emergency Medicine | Admitting: Emergency Medicine

## 2023-04-11 ENCOUNTER — Encounter (HOSPITAL_BASED_OUTPATIENT_CLINIC_OR_DEPARTMENT_OTHER): Payer: Self-pay | Admitting: Emergency Medicine

## 2023-04-11 ENCOUNTER — Emergency Department (HOSPITAL_BASED_OUTPATIENT_CLINIC_OR_DEPARTMENT_OTHER): Payer: Commercial Managed Care - HMO

## 2023-04-11 ENCOUNTER — Other Ambulatory Visit: Payer: Self-pay

## 2023-04-11 DIAGNOSIS — J45909 Unspecified asthma, uncomplicated: Secondary | ICD-10-CM | POA: Insufficient documentation

## 2023-04-11 DIAGNOSIS — J40 Bronchitis, not specified as acute or chronic: Secondary | ICD-10-CM | POA: Diagnosis not present

## 2023-04-11 DIAGNOSIS — R059 Cough, unspecified: Secondary | ICD-10-CM | POA: Diagnosis present

## 2023-04-11 DIAGNOSIS — J441 Chronic obstructive pulmonary disease with (acute) exacerbation: Secondary | ICD-10-CM | POA: Insufficient documentation

## 2023-04-11 DIAGNOSIS — Z20822 Contact with and (suspected) exposure to covid-19: Secondary | ICD-10-CM | POA: Diagnosis not present

## 2023-04-11 LAB — RESP PANEL BY RT-PCR (RSV, FLU A&B, COVID)  RVPGX2
Influenza A by PCR: NEGATIVE
Influenza B by PCR: NEGATIVE
Resp Syncytial Virus by PCR: NEGATIVE
SARS Coronavirus 2 by RT PCR: NEGATIVE

## 2023-04-11 MED ORDER — PREDNISONE 10 MG PO TABS: 40.0000 mg | ORAL_TABLET | Freq: Every day | ORAL | 0 refills | Status: DC

## 2023-04-11 MED ORDER — IPRATROPIUM-ALBUTEROL 0.5-2.5 (3) MG/3ML IN SOLN
3.0000 mL | Freq: Once | RESPIRATORY_TRACT | Status: AC
  Administered 2023-04-11: 3 mL via RESPIRATORY_TRACT
  Filled 2023-04-11: qty 3

## 2023-04-11 MED ORDER — DOXYCYCLINE HYCLATE 100 MG PO TABS
100.0000 mg | ORAL_TABLET | Freq: Once | ORAL | Status: AC
  Administered 2023-04-11: 100 mg via ORAL
  Filled 2023-04-11: qty 1

## 2023-04-11 MED ORDER — DOXYCYCLINE HYCLATE 100 MG PO CAPS: 100.0000 mg | ORAL_CAPSULE | Freq: Two times a day (BID) | ORAL | 0 refills | Status: AC

## 2023-04-11 MED ORDER — PREDNISONE 50 MG PO TABS
60.0000 mg | ORAL_TABLET | Freq: Once | ORAL | Status: AC
  Administered 2023-04-11: 60 mg via ORAL
  Filled 2023-04-11: qty 1

## 2023-04-11 NOTE — Telephone Encounter (Signed)
Patient checking on message for Prednisone. Patient phone number is [phone].

## 2023-04-11 NOTE — ED Provider Notes (Signed)
EMERGENCY DEPARTMENT AT [facility] Provider Note   CSN: [id number] Arrival date & time: 04/11/23  1741     History  Chief Complaint  Patient presents with   Cough    Jocelyn Myers is a 57 y.o. female.  Patient is a 57 year old female with a past medical history of COPD and asthma presenting to the emergency department for cough.  The patient reports that she has had a cough for several weeks and for about the last week it has been productive of a thick yellow sputum.  She states that she has needed her inhaler more often than usual.  She reports associated shortness of breath.  She states she only has chest pain when she coughs.  She states she has associated congestion and sore throat.  She states that she has felt feverish and chilled at home but has not measured her temperature.  She states that she talked to her primary doctor who recommended that she come to the ER for an x-ray.  The history is provided by the patient.  Cough      Home Medications Prior to Admission medications   Medication Sig Start Date End Date Taking? Authorizing Provider  doxycycline (VIBRAMYCIN) 100 MG capsule Take 1 capsule (100 mg total) by mouth 2 (two) times daily for 5 days. 04/11/23 04/16/23 Yes Theresia Lo, Benetta Spar K, DO  predniSONE (DELTASONE) 10 MG tablet Take 4 tablets (40 mg total) by mouth daily. 04/11/23  Yes Theresia Lo, Benetta Spar K, DO  albuterol (PROAIR HFA) 108 (90 BASE) MCG/ACT inhaler Inhale 2 puffs into the lungs every 6 (six) hours as needed for wheezing or shortness of breath. 04/29/13   Michele Mcalpine, MD  Dupilumab (DUPIXENT) 300 MG/2ML SOAJ Inject 300 mg into the skin every 14 (fourteen) days. 11/21/22   Waymon Budge, MD  fluticasone (FLONASE) 50 MCG/ACT nasal spray Place 2 sprays into both nostrils daily. 06/26/16   Simonne Martinet, NP  Fluticasone-Umeclidin-Vilant (TRELEGY ELLIPTA) 100-62.5-25 MCG/ACT AEPB Inhale 1 puff into the lungs daily. 12/20/22   Waymon Budge, MD  guaiFENesin (MUCINEX) 600 MG 12 hr tablet Take 1 tablet (600 mg total) by mouth 2 (two) times daily. 06/26/16   Simonne Martinet, NP  ipratropium-albuterol (DUONEB) 0.5-2.5 (3) MG/3ML SOLN Inhale 1 neb every 6 hours as needed 06/30/22   Jetty Duhamel D, MD  montelukast (SINGULAIR) 10 MG tablet TAKE 1 TABLET BY MOUTH EVERYDAY AT BEDTIME 05/22/22   Jetty Duhamel D, MD  Multiple Vitamin (MULTIVITAMIN) tablet Take 1 tablet by mouth daily.    [provider]  promethazine-dextromethorphan (PROMETHAZINE-DM) 6.25-15 MG/5ML syrup 5 ml ever 8 hours if needed for cough 10/04/22   Waymon Budge, MD      Allergies    Benadryl [diphenhydramine hcl] and Z-pak [azithromycin]    Review of Systems   Review of Systems  Respiratory:  Positive for cough.     Physical Exam Updated Vital Signs BP 121/81 (BP Location: Right Arm)   Pulse 78   Temp 98.1 F (36.7 C)   Resp 20   Ht 5' 1.5" (1.562 m)   Wt 86.2 kg   LMP 02/22/2011   SpO2 97%   BMI 35.32 kg/m  Physical Exam Vitals and nursing note reviewed.  Constitutional:      General: She is not in acute distress.    Appearance: Normal appearance.  HENT:     Head: Normocephalic and atraumatic.     Nose: Nose normal.  Mouth/Throat:     Mouth: Mucous membranes are moist.     Pharynx: Oropharynx is clear.  Eyes:     Extraocular Movements: Extraocular movements intact.     Conjunctiva/sclera: Conjunctivae normal.  Cardiovascular:     Rate and Rhythm: Normal rate and regular rhythm.     Heart sounds: Normal heart sounds.  Pulmonary:     Effort: Pulmonary effort is normal.     Breath sounds: Wheezing (End expiratory, diffuse) present.  Abdominal:     General: Abdomen is flat.     Palpations: Abdomen is soft.     Tenderness: There is no abdominal tenderness.  Musculoskeletal:        General: Normal range of motion.     Cervical back: Normal range of motion.     Right lower leg: No edema.     Left lower leg: No  edema.  Skin:    General: Skin is warm and dry.  Neurological:     General: No focal deficit present.     Mental Status: She is alert and oriented to person, place, and time.  Psychiatric:        Mood and Affect: Mood normal.        Behavior: Behavior normal.     ED Results / Procedures / Treatments   Labs (all labs ordered are listed, but only abnormal results are displayed) Labs Reviewed  RESP PANEL BY RT-PCR (RSV, FLU A&B, COVID)  [id number]    EKG None  Radiology DG Chest 2 View  Result Date: 04/11/2023 CLINICAL DATA:  Cough for 1 week. EXAM: CHEST - 2 VIEW COMPARISON:  06/28/2022 FINDINGS: The heart size and mediastinal contours are within normal limits. Both lungs are clear. The visualized skeletal structures are unremarkable. IMPRESSION: No active cardiopulmonary disease. Electronically Signed   By: Burman Nieves M.D.   On: 04/11/2023 19:37    Procedures Procedures    Medications Ordered in ED Medications  ipratropium-albuterol (DUONEB) 0.5-2.5 (3) MG/3ML nebulizer solution 3 mL (3 mLs Nebulization Given 04/11/23 2025)  predniSONE (DELTASONE) tablet 60 mg (60 mg Oral Given 04/11/23 2009)  doxycycline (VIBRA-TABS) tablet 100 mg (100 mg Oral Given 04/11/23 2009)    ED Course/ Medical Decision Making/ A&P                                 Medical Decision Making This patient presents to the ED with chief complaint(s) of cough with pertinent past medical history of asthma, COPD which further complicates the presenting complaint. The complaint involves an extensive differential diagnosis and also carries with it a high risk of complications and morbidity.    The differential diagnosis includes asthma exacerbation, COPD exacerbation, bronchitis, pneumonia, pneumothorax, pulmonary edema, pleural effusion, viral syndrome  Additional history obtained: Additional history obtained from spouse Records reviewed Primary Care Documents  ED Course and Reassessment: On patient's  arrival she is hemodynamically stable in no acute distress.  She is initially evaluated in triage and had chest x-ray and viral swab performed.  X-ray is without acute disease and viral swab is negative.  She does have active wheezing on exam and with her change in cough, concern for acute bronchitis and will be treated with steroids, prednisone and will be given DuoNeb will be closely reassessed.  Independent labs interpretation:  The following labs were independently interpreted: viral swab negative  Independent visualization of imaging: - I independently visualized the following imaging with  scope of interpretation limited to determining acute life threatening conditions related to emergency care: CXR, which revealed no acute disease  Consultation: - Consulted or discussed management/test interpretation w/ external professional: N/A  Consideration for admission or further workup: Patient has no emergent conditions requiring admission or further work-up at this time and is stable for discharge home with primary care follow-up  Social Determinants of health: N/A    Amount and/or Complexity of Data Reviewed Radiology: ordered.  Risk Prescription drug management.          Final Clinical Impression(s) / ED Diagnoses Final diagnoses:  Bronchitis  COPD exacerbation (HCC)    Rx / DC Orders ED Discharge Orders          Ordered    doxycycline (VIBRAMYCIN) 100 MG capsule  2 times daily        04/11/23 2139    predniSONE (DELTASONE) 10 MG tablet  Daily        04/11/23 2139              Rexford Maus, DO 04/11/23 2141

## 2023-04-11 NOTE — ED Notes (Signed)
   04/11/23 1756  Respiratory Assessment  $ RT Protocol Assessment  Yes  Assessment Type Assess only  Respiratory Pattern Regular;Unlabored;Symmetrical  Chest Assessment Chest expansion symmetrical  Cough Productive  Sputum Color Yellow;Green (per patient)  Sputum Specimen Source Spontaneous cough  Bilateral Breath Sounds Clear;Diminished  Oxygen Therapy/Pulse Ox  O2 Therapy Room air  SpO2 98 %   Seen before triage, increased cough with yellow/green secretions, speaking in complete sentences. No distress

## 2023-04-11 NOTE — Telephone Encounter (Signed)
Called and spoke to patient.  She is requesting prednisone.  She reports of prod cough with thick yellow sputum, decreased energy, wheezing, chills and sweats x1wk SOB is baseline.  She does not feel that she has a fever, however she has not measured her temp/ No recent covid or flu test.  She is using trelegy once daily. She has not used albuterol.   Katie, please advise. Dr. Maple Hudson is unavailable.

## 2023-04-11 NOTE — ED Triage Notes (Signed)
Pt c/o cough for a while, now productive

## 2023-04-11 NOTE — Telephone Encounter (Signed)
Patient is aware of below message/recommendations and voiced her understanding.  She will keep scheduled appt and will present to UC today.  Nothing further needed.

## 2023-04-11 NOTE — Discharge Instructions (Signed)
You are seen in the emergency department for your cough and your shortness of breath.  Your x-ray showed no signs of pneumonia and you tested negative for COVID and flu.  He were wheezing on your exam and likely have bronchitis.  I have given you a short course of steroids and antibiotics and you should complete this as prescribed.  You should follow-up with your primary doctor in the next few days to have your symptoms rechecked.  You should return to the emergency department if having fevers despite the antibiotics, worsening shortness of breath or any other new or concerning symptoms.

## 2023-04-11 NOTE — Telephone Encounter (Signed)
She was sick at her last OV and never seen back. With her current symptoms, she needs imaging of her chest to ensure she has not developed a pneumonia. It could be viral but with the chills/sweats, need to rule this out. Recommend she go to UC for further evaluation. Keep upcoming appt with Dr. Maple Hudson. Thanks.

## 2023-04-12 ENCOUNTER — Other Ambulatory Visit: Payer: Self-pay

## 2023-04-12 ENCOUNTER — Telehealth: Payer: Self-pay

## 2023-04-12 NOTE — Progress Notes (Signed)
Specialty Pharmacy Refill Coordination Note  Jocelyn Myers is a 57 y.o. female contacted today regarding refills of specialty medication(s) Dupilumab   Patient requested No data recorded  Delivery date: 04/18/23  Verified address: [address]     Medication will be filled on 04/17/23.

## 2023-04-12 NOTE — Transitions of Care (Post Inpatient/ED Visit) (Signed)
   04/12/2023  Name: Jocelyn Myers MRN: [id number] DOB: 11-30-1965  Today's TOC FU Call Status: Today's TOC FU Call Status:: Unsuccessful Call (1st Attempt) Unsuccessful Call (1st Attempt) Date: 04/12/23  Attempted to reach the patient regarding the most recent Inpatient/ED visit.  Follow Up Plan: Additional outreach attempts will be made to reach the patient to complete the Transitions of Care (Post Inpatient/ED visit) call.   Signature Karena Addison, LPN [facility] Nurse Health Advisor Direct Dial [phone]

## 2023-04-13 ENCOUNTER — Telehealth: Payer: Self-pay | Admitting: Pharmacist

## 2023-04-13 NOTE — Telephone Encounter (Signed)
Submitted a Prior Authorization RENEWAL request to [employer] for DUPIXENT via CoverMyMeds. Will update once we receive a response.  Key: [id number]

## 2023-04-13 NOTE — Telephone Encounter (Signed)
Received notification from [employer] regarding a prior authorization for DUPIXENT. Authorization has been APPROVED from 04/13/2023 to 04/12/2024.   Authorization # [id number]  Jocelyn Myers, PharmD, MPH, BCPS, CPP Clinical Pharmacist (Rheumatology and Pulmonology)

## 2023-04-17 ENCOUNTER — Other Ambulatory Visit: Payer: Self-pay

## 2023-04-17 NOTE — Transitions of Care (Post Inpatient/ED Visit) (Signed)
   04/17/2023  Name: Jocelyn Myers MRN: [id number] DOB: 1965/07/24  Today's TOC FU Call Status: Today's TOC FU Call Status:: Unsuccessful Call (2nd Attempt) Unsuccessful Call (1st Attempt) Date: 04/12/23 Unsuccessful Call (2nd Attempt) Date: 04/17/23  Attempted to reach the patient regarding the most recent Inpatient/ED visit.  Follow Up Plan: Additional outreach attempts will be made to reach the patient to complete the Transitions of Care (Post Inpatient/ED visit) call.   Signature Karena Addison, LPN [facility] Nurse Health Advisor Direct Dial [phone]

## 2023-04-24 NOTE — Transitions of Care (Post Inpatient/ED Visit) (Signed)
   04/24/2023  Name: Jocelyn Myers MRN: [id number] DOB: 10/18/1965  Today's TOC FU Call Status: Today's TOC FU Call Status:: Unsuccessful Call (3rd Attempt) Unsuccessful Call (1st Attempt) Date: 04/12/23 Unsuccessful Call (2nd Attempt) Date: 04/17/23 Unsuccessful Call (3rd Attempt) Date: 04/24/23  Attempted to reach the patient regarding the most recent Inpatient/ED visit.  Follow Up Plan: No further outreach attempts will be made at this time. We have been unable to contact the patient.  Signature Karena Addison, LPN [facility] Nurse Health Advisor Direct Dial [phone]

## 2023-05-08 ENCOUNTER — Other Ambulatory Visit: Payer: Self-pay | Admitting: Internal Medicine

## 2023-05-08 ENCOUNTER — Other Ambulatory Visit (HOSPITAL_COMMUNITY): Payer: Self-pay

## 2023-05-08 DIAGNOSIS — J4489 Other specified chronic obstructive pulmonary disease: Secondary | ICD-10-CM

## 2023-05-08 NOTE — Progress Notes (Signed)
 Specialty Pharmacy Ongoing Clinical Assessment Note  Jocelyn Myers is a 57 y.o. female who is being followed by the specialty pharmacy service for RxSp Asthma/COPD   Patient's specialty medication(s) reviewed today: Dupilumab  (Dupixent )   Missed doses in the last 4 weeks: 0   Patient/Caregiver did not have any additional questions or concerns.   Therapeutic benefit summary: Patient is achieving benefit   Adverse events/side effects summary: No adverse events/side effects   Patient's therapy is appropriate to: Continue    Goals Addressed             This Visit's Progress    Minimize recurrence of flares       Patient is not on track and no change. Patient will maintain adherence.  Jocelyn Myers is stable but does not feel that she is symptom free on the Dupixent , she plans to discuss other options with Dr. Neysa at her appt on 06/01/23.          Follow up:  6 months  Jocelyn Myers Specialty Pharmacist

## 2023-05-08 NOTE — Progress Notes (Signed)
 Specialty Pharmacy Refill Coordination Note  Jocelyn Myers is a 57 y.o. female contacted today regarding refills of specialty medication(s) Dupilumab  (Dupixent )   Patient requested Delivery   Delivery date: 05/17/23   Verified address: [address] [name] [address]   Medication will be filled on 05/16/23 *Pending refill request.

## 2023-05-10 ENCOUNTER — Other Ambulatory Visit: Payer: Self-pay | Admitting: Internal Medicine

## 2023-05-10 ENCOUNTER — Other Ambulatory Visit: Payer: Self-pay

## 2023-05-10 DIAGNOSIS — J4489 Other specified chronic obstructive pulmonary disease: Secondary | ICD-10-CM

## 2023-05-11 ENCOUNTER — Other Ambulatory Visit: Payer: Self-pay

## 2023-05-11 MED ORDER — DUPIXENT 300 MG/2ML ~~LOC~~ SOAJ
300.0000 mg | SUBCUTANEOUS | 0 refills | Status: DC
  Filled 2023-05-11: qty 4, 28d supply, fill #0

## 2023-05-11 NOTE — Telephone Encounter (Signed)
 Refill sent for DUPIXENT  to [facility]: [phone]   Dose: 300mg  SQ every 14 days  Last OV: 11/20/22 Provider: Dr. Neysa  Next OV: 06/01/23  Routing to scheduling team for follow-up on appt scheduling  Sherry Pennant, PharmD, MPH, BCPS Clinical Pharmacist (Rheumatology and Pulmonology)

## 2023-05-16 ENCOUNTER — Other Ambulatory Visit: Payer: Self-pay

## 2023-05-21 ENCOUNTER — Other Ambulatory Visit (HOSPITAL_COMMUNITY): Payer: Self-pay

## 2023-05-21 ENCOUNTER — Other Ambulatory Visit: Payer: Self-pay

## 2023-05-29 ENCOUNTER — Other Ambulatory Visit: Payer: Self-pay

## 2023-05-30 NOTE — Progress Notes (Signed)
HPI female never smoker/+ secondhand, followed for COPD with asthma, probably chronic fixed asthma PFT 03/10/2014-severe obstructive airways disease, no response to BD, normal TLC, normal DLCO. FVC 2.22/84%, FEV1 0.95/44%, ratio 0.43 Office Spirometry-08/02/2015-severe obstructive airways disease.(Done after Xopenex nebulizer treatment) FVC 2.58/102%, FEV1 1.00/47%, FEV1/FVC 0.39, 25-75 0.41/14% ECHO- 06/23/16- EF 65-70 percent a1AT- WNL 124 MM 07/10/12 Allergy Profile 12/05/13-total IgE normal 101 with minimal elevations for dust mite and [place] pollen. CBC  normal eosinophils Office Spirometry 07/04/16- moderate obstructive airways disease-FVC 2.59/100%, FEV1 1.31/63%, ratio 0.51, FEF 25-75% 0.69/30% PFT 04/21/22-severe obstructive airways disease insignificant response to bronchodilator normal diffusion -------------------------------------------------------------------------------------------------------.  11/20/22- 58 year old female never smoker/+ secondhand, followed for Asthma/COPD Overlap, allergic rhinitis, Eczema, obesity  - singulair, ProAir hfa, Neb Duoneb, flonase, Dupixent  Pulmonary Rehab-for severe COPD stage 3 -----Breathing has not been the best ACT [employer] won't cover Trelegy or [facility] She has not been using a maintenance controller recently-discussed.  Dupixent seems to be helpful and will be continued.  She is having more difficulty with her breathing and describes much cough with yellow sputum.  Finds it hard to lie down at first at bedtime each night and has to prop up.  Nebulizer helps some. We are going to try samples of Breztri and if helpful she will check on insurance coverage for that.  06/01/23- 58 year old female never smoker/+ secondhand, followed for Asthma/COPD Overlap, allergic rhinitis, Eczema, obesity  - singulair, ProAir hfa, Neb Duoneb, flonase, Dupixent, Trelegy 100,   Pulmonary Rehab-for severe COPD stage 3 -----Asthma-COPD overlap syndrome F/U visit.  Pt request to discontinue Dupixent. Dislikes pain of injection and doesn't feel it is helping her. CXR 04/11/23 IMPRESSION: No active cardiopulmonary disease. Discussed the use of AI scribe software for clinical note transcription with the patient, who gave verbal consent to proceed.  History of Present Illness   The patient, with a history of respiratory disease, presents with a persistent cough that has been ongoing for several months. The cough is both productive and dry, with the sputum varying from clear to dark yellow. She also reports experiencing night sweats, which may be related to the onset of menopause.  The patient has been on Dupixent, but wishes to discontinue due to lack of perceived benefit and discomfort from injections. She continues to use the Trelegy inhaler once daily and has found pulmonary rehabilitation helpful. She also uses a nebulizer machine once or twice a week.  In addition to her respiratory symptoms, the patient has been experiencing a nagging cough, which has not improved despite a course of doxycycline in July. She has been managing the cough with promethazine DM cough syrup.      ROS-see HPI   + = positive Constitutional:   No-   weight loss, night sweats, fevers, chills, fatigue, lassitude. HEENT:   No-  headaches, difficulty swallowing, tooth/dental problems, +sore throat,       sneezing, itching, ear ache, +nasal congestion, post nasal drip,  CV:  No-   chest pain, orthopnea, PND, swelling in lower extremities, anasarca, dizziness, palpitations Resp: +  shortness of breath with exertion or at rest.               +productive cough, + non-productive cough,  No- coughing up of blood.              No-   change in color of mucus.  + wheezing.   Skin: No-   rash or lesions. GI:  No-   heartburn, indigestion, abdominal pain,  nausea, vomiting,  GU: n. MS:  No-   joint pain or swelling.   Neuro-     nothing unusual Psych:  No- change in mood or affect. No  depression or anxiety.  No memory loss.  OBJ- Physical Exam    General- Alert, Oriented, Affect-appropriate, Distress- none acute, + overweight Skin- rash-none, lesions- none, excoriation- none Lymphadenopathy- none Head- atraumatic            Eyes- Gross vision intact, PERRLA, conjunctivae and secretions clear            Ears- Hearing, canals-normal            Nose- Clear, no-Septal dev, mucus, polyps, erosion, perforation. + nasal crease             Throat- Mallampati III , mucosa-, drainage- none, tonsils- atrophic, own teeth Neck- flexible , trachea midline, no stridor , thyroid nl, carotid no bruit Chest - symmetrical excursion , unlabored           Heart/CV- RRR , no murmur , no gallop  , no rub, nl s1 s2                           - JVD- none , edema- none, stasis changes- none, varices- none           Lung-  +minimal coarse sounds in bases, wheeze, cough-none, dullness-none, rub- none           Chest wall-  Abd-  Br/ Gen/ Rectal- Not done, not indicated Extrem- cyanosis- none, clubbing, none, atrophy- none, strength- nl Neuro- grossly intact to observation  Assessment and Plan    Chronic Cough Persistent for months with both dry and productive episodes. Sputum sometimes clear, sometimes dark yellow. No current wheezing or rales on exam. Prior trial of doxycycline without resolution. -Start Augmentin to target potential infection. -Refill Promethazine DM cough syrup.  Asthma Stable on current regimen. No acute flare-ups reported. Discontinuation of Dupixent due to patient preference. -Continue Trelegy inhaler once daily. -Continue use of nebulizer 1-2 times weekly as needed.  General Health Maintenance / Followup Plans -Continue pulmonary rehabilitation as it has shown to be beneficial. -Follow-up after completion of Augmentin course to assess response.

## 2023-05-31 ENCOUNTER — Other Ambulatory Visit: Payer: Self-pay

## 2023-06-01 ENCOUNTER — Ambulatory Visit (INDEPENDENT_AMBULATORY_CARE_PROVIDER_SITE_OTHER): Payer: Commercial Managed Care - HMO | Admitting: Internal Medicine

## 2023-06-01 ENCOUNTER — Encounter: Payer: Self-pay | Admitting: Internal Medicine

## 2023-06-01 VITALS — BP 90/68 | HR 83 | Ht 61.0 in | Wt 215.8 lb

## 2023-06-01 DIAGNOSIS — J4489 Other specified chronic obstructive pulmonary disease: Secondary | ICD-10-CM

## 2023-06-01 DIAGNOSIS — J45909 Unspecified asthma, uncomplicated: Secondary | ICD-10-CM

## 2023-06-01 MED ORDER — AMOXICILLIN-POT CLAVULANATE 875-125 MG PO TABS: 1.0000 | ORAL_TABLET | Freq: Two times a day (BID) | ORAL | 0 refills | Status: DC

## 2023-06-01 MED ORDER — PROMETHAZINE-DM 6.25-15 MG/5ML PO SYRP: ORAL_SOLUTION | ORAL | 12 refills | Status: AC

## 2023-06-01 NOTE — Patient Instructions (Signed)
We are stopping Dupixent   Script  sent refilling cough syrup  Script sent for augmentin antibiotic

## 2023-06-04 ENCOUNTER — Other Ambulatory Visit (HOSPITAL_COMMUNITY): Payer: Self-pay

## 2023-06-07 ENCOUNTER — Other Ambulatory Visit: Payer: Self-pay | Admitting: Pharmacist

## 2023-06-07 ENCOUNTER — Other Ambulatory Visit: Payer: Self-pay

## 2023-06-07 NOTE — Progress Notes (Signed)
Patient dis-enrolled from [facility] for Dupixent. Patient has discontinued Dupixent per office note from Dr. Maple Hudson. This is patient preference.  Chesley Mires, PharmD, MPH, BCPS, CPP Clinical Pharmacist (Rheumatology and Pulmonology)

## 2023-06-14 ENCOUNTER — Telehealth: Payer: Self-pay | Admitting: Internal Medicine

## 2023-06-14 MED ORDER — DOXYCYCLINE HYCLATE 100 MG PO TABS: 100.0000 mg | ORAL_TABLET | Freq: Two times a day (BID) | ORAL | 0 refills | Status: DC

## 2023-06-14 NOTE — Telephone Encounter (Signed)
 Patient has a cough and wheezing. She thinks that she may have the flu. She would like some prednisone  called in.  [facility] [address]

## 2023-06-14 NOTE — Telephone Encounter (Signed)
 I called and informed pt. RX sent in. Pt verbalized understanding. NFN

## 2023-06-14 NOTE — Telephone Encounter (Signed)
 Please send doxycycline  100 mg, # 14, 1 twice daily Stay well hydrated. There is a lot of flu going around. If possible, get tested for flu. If breathing gets bad, go to ER.

## 2023-06-14 NOTE — Telephone Encounter (Signed)
 I called and spoke to pt. Pt states she has a headache off and on in the frontal area. Has taken Tylenol . The cough is persistent. Cough is both wet and dry, when she wakes up in the morning it is dry, this does wake her up at night. Dr Neysa rx Promethazine  and pt stated this will help ease the cough but it does not help her long. The wheezing is when she lays down. Pt has sob- walking around the house or with exertion and she feels like she is losing her breath and has to sit down. Denies fever. Pt states she has sinus and chest congestion. Runny nose. The mucous is bright yellow. Unknown exposure to covid, flu, or RSV. Has not been seen by PCP or been to urgent care recently, Has not been tested for Covid, Flu, or RSV. Pt would like an antibiotic called in to help. Please advise.

## 2023-07-16 ENCOUNTER — Other Ambulatory Visit: Payer: Self-pay | Admitting: Internal Medicine

## 2023-07-17 ENCOUNTER — Telehealth: Payer: Self-pay | Admitting: Internal Medicine

## 2023-07-17 DIAGNOSIS — J209 Acute bronchitis, unspecified: Secondary | ICD-10-CM

## 2023-07-17 MED ORDER — TRELEGY ELLIPTA 100-62.5-25 MCG/ACT IN AEPB: 1.0000 | INHALATION_SPRAY | Freq: Every day | RESPIRATORY_TRACT | Status: DC

## 2023-07-17 NOTE — Telephone Encounter (Signed)
 Ok 2 samples of her Trelegy 100  Before we send more antibiotic:           Please order sputum cultures with smear and sensitivities for Routine, fungal, AFB   dx chronic bronchitis exacerbation

## 2023-07-17 NOTE — Telephone Encounter (Signed)
 PT calling asking for Trelegy Samples and wonders if Dr. Maple Hudson could call in more meds because her cough is still present and now her phlegm is a bright greenish/yellow. Her # is [phone]  [facility] [address]

## 2023-07-17 NOTE — Telephone Encounter (Signed)
 Called patient.  Patient has had cough with yellow phlegm for over 6 months.  Wheeze and SOB started 3 days ago.  No fever, chills aches or pains.  Patient also wants 2 samples of Trelegy 100 mcg.  Outpatient Encounter Medications as of 07/17/2023  Medication Sig   albuterol (PROAIR HFA) 108 (90 BASE) MCG/ACT inhaler Inhale 2 puffs into the lungs every 6 (six) hours as needed for wheezing or shortness of breath.   fluticasone (FLONASE) 50 MCG/ACT nasal spray Place 2 sprays into both nostrils daily.   Fluticasone-Umeclidin-Vilant (TRELEGY ELLIPTA) 100-62.5-25 MCG/ACT AEPB Inhale 1 puff into the lungs daily.   Multiple Vitamin (MULTIVITAMIN) tablet Take 1 tablet by mouth daily.   promethazine-dextromethorphan (PROMETHAZINE-DM) 6.25-15 MG/5ML syrup 5 ml ever 8 hours if needed for cough   guaiFENesin (MUCINEX) 600 MG 12 hr tablet Take 1 tablet (600 mg total) by mouth 2 (two) times daily. (Patient not taking: Reported on 07/17/2023)   ipratropium-albuterol (DUONEB) 0.5-2.5 (3) MG/3ML SOLN Inhale 1 neb every 6 hours as needed (Patient not taking: Reported on 07/17/2023)   montelukast (SINGULAIR) 10 MG tablet TAKE 1 TABLET BY MOUTH EVERYDAY AT BEDTIME (Patient not taking: Reported on 07/17/2023)   [DISCONTINUED] amoxicillin-clavulanate (AUGMENTIN) 875-125 MG tablet Take 1 tablet by mouth 2 (two) times daily. (Patient not taking: Reported on 07/17/2023)   [DISCONTINUED] doxycycline (VIBRA-TABS) 100 MG tablet Take 1 tablet (100 mg total) by mouth 2 (two) times daily. (Patient not taking: Reported on 07/17/2023)   No facility-administered encounter medications on file as of 07/17/2023.    Allergies  Allergen Reactions   Benadryl [Diphenhydramine Hcl] Hives and Shortness Of Breath   Z-Pak [Azithromycin] Hives

## 2023-07-17 NOTE — Telephone Encounter (Signed)
 Called patient.  Gave all information.  Ordered sputum cultures per Dr. Maple Hudson.  Put sputum cups and Trelegy 100 mcg samples at front.  Patient will pick up tomorrow morning.  Instructions provided on how to collect sputum cultures.  Patient verbalized understanding.

## 2023-10-30 ENCOUNTER — Ambulatory Visit: Payer: Self-pay | Admitting: Internal Medicine

## 2023-10-30 MED ORDER — PREDNISONE 10 MG PO TABS: ORAL_TABLET | ORAL | 0 refills | Status: DC

## 2023-10-30 NOTE — Telephone Encounter (Signed)
 FYI Only or Action Required?: Action required by provider: patient requesting medication to help with current symptoms.  Patient is followed in Pulmonology for COPD, last seen on 06/01/2023 by Neysa Reggy BIRCH, MD. Called Nurse Triage reporting Shortness of Breath. Symptoms began a week ago. Interventions attempted: Rescue inhaler, Maintenance inhaler, and Increased fluids/rest. Symptoms are: unchanged.  Triage Disposition: See HCP Within 4 Hours (Or PCP Triage)-patient asking for medications to help with symptoms-patient asking for prednisone .  Patient/caregiver understands and will follow disposition?: No, wishes to speak with PCP  Copied from CRM [id number]. Topic: Clinical - Red Word Triage >> Oct 30, 2023  1:00 PM Corean SAUNDERS wrote: Red Word that prompted transfer to Nurse Triage: Shortness of breath, wheezing, and spitting up bright yellow mucus. Reason for Disposition  [1] MILD difficulty breathing (e.g., minimal/no SOB at rest, SOB with walking, pulse <100) AND [2] NEW-onset or WORSE than normal  Answer Assessment - Initial Assessment Questions 1. RESPIRATORY STATUS: Describe your breathing? (e.g., wheezing, shortness of breath, unable to speak, severe coughing)      Shortness of breath, cough 2. ONSET: When did this breathing problem begin?      Started a week ago 3. PATTERN Does the difficult breathing come and go, or has it been constant since it started?      constant 4. SEVERITY: How bad is your breathing? (e.g., mild, moderate, severe)    - MILD: No SOB at rest, mild SOB with walking, speaks normally in sentences, can lie down, no retractions, pulse < 100.    - MODERATE: SOB at rest, SOB with minimal exertion and prefers to sit, cannot lie down flat, speaks in phrases, mild retractions, audible wheezing, pulse 100-120.    - SEVERE: Very SOB at rest, speaks in single words, struggling to breathe, sitting hunched forward, retractions, pulse > 120      Mild 5. RECURRENT  SYMPTOM: Have you had difficulty breathing before? If Yes, ask: When was the last time? and What happened that time?      Yes-last time was early part this year.  6. CARDIAC HISTORY: Do you have any history of heart disease? (e.g., heart attack, angina, bypass surgery, angioplasty)      no 7. LUNG HISTORY: Do you have any history of lung disease?  (e.g., pulmonary embolus, asthma, emphysema)     COPD 8. CAUSE: What do you think is causing the breathing problem?      Started as a cold-patient reports coughing up bright yellow mucus 9. OTHER SYMPTOMS: Do you have any other symptoms? (e.g., dizziness, runny nose, cough, chest pain, fever)     Runny nose, chest pain from coughing 10. O2 SATURATION MONITOR:  Do you use an oxygen saturation monitor (pulse oximeter) at home? If Yes, ask: What is your reading (oxygen level) today? What is your usual oxygen saturation reading? (e.g., 95%)       N/A-patient doesn't have a pulse oximeter at home.  12. TRAVEL: Have you traveled out of the country in the last month? (e.g., travel history, exposures)       No  Patient reports one week history of cold symptoms-runny nose, cough with now some mild shortness of breath. Patient states she has been using her inhalers but doesn't think they are helping. Patient would like for provider to send prednisone  to her pharmacy.  Protocols used: Breathing Difficulty-A-AH

## 2023-10-30 NOTE — Telephone Encounter (Signed)
 Prednisone  taper sent to [facility] [address]

## 2023-10-30 NOTE — Addendum Note (Signed)
 Addended by: NEYSA RAMA D on: 10/30/2023 01:57 PM   Modules accepted: Orders

## 2023-10-30 NOTE — Telephone Encounter (Signed)
 Pt.notified

## 2023-11-14 ENCOUNTER — Ambulatory Visit: Payer: Self-pay

## 2023-11-14 ENCOUNTER — Ambulatory Visit (INDEPENDENT_AMBULATORY_CARE_PROVIDER_SITE_OTHER): Admitting: Internal Medicine

## 2023-11-14 ENCOUNTER — Ambulatory Visit (INDEPENDENT_AMBULATORY_CARE_PROVIDER_SITE_OTHER)

## 2023-11-14 ENCOUNTER — Encounter: Payer: Self-pay | Admitting: Internal Medicine

## 2023-11-14 VITALS — BP 126/70 | HR 56 | Temp 97.9°F | Ht 61.0 in | Wt 209.0 lb

## 2023-11-14 DIAGNOSIS — J4489 Other specified chronic obstructive pulmonary disease: Secondary | ICD-10-CM

## 2023-11-14 MED ORDER — BREZTRI AEROSPHERE 160-9-4.8 MCG/ACT IN AERO: INHALATION_SPRAY | RESPIRATORY_TRACT | 11 refills | Status: DC

## 2023-11-14 MED ORDER — TRELEGY ELLIPTA 100-62.5-25 MCG/ACT IN AEPB: 1.0000 | INHALATION_SPRAY | Freq: Every day | RESPIRATORY_TRACT | Status: DC

## 2023-11-14 MED ORDER — BREZTRI AEROSPHERE 160-9-4.8 MCG/ACT IN AERO: 2.0000 | INHALATION_SPRAY | Freq: Two times a day (BID) | RESPIRATORY_TRACT | Status: DC

## 2023-11-14 MED ORDER — TRELEGY ELLIPTA 100-62.5-25 MCG/ACT IN AEPB: 1.0000 | INHALATION_SPRAY | Freq: Every day | RESPIRATORY_TRACT | 5 refills | Status: DC

## 2023-11-14 MED ORDER — PREDNISONE 10 MG PO TABS: ORAL_TABLET | ORAL | 0 refills | Status: DC

## 2023-11-14 MED ORDER — PANTOPRAZOLE SODIUM 40 MG PO TBEC: 40.0000 mg | DELAYED_RELEASE_TABLET | Freq: Every day | ORAL | 2 refills | Status: DC

## 2023-11-14 MED ORDER — FAMOTIDINE 20 MG PO TABS: ORAL_TABLET | ORAL | 11 refills | Status: AC

## 2023-11-14 MED ORDER — CEFDINIR 300 MG PO CAPS: 300.0000 mg | ORAL_CAPSULE | Freq: Two times a day (BID) | ORAL | 0 refills | Status: DC

## 2023-11-14 NOTE — Progress Notes (Unsigned)
 Jocelyn Myers, female    DOB: 08-20-1965   MRN: [id number]   Brief patient profile:  78 yobf  never smoker Dr Neysa pt  self referred to pulmonary clinic 11/14/2023 for asthma flare off maint rx         History of Present Illness  11/14/2023  Pulmonary/  ACUTE office eval/Darick Fetters  off trelegy x  2 months and onset of uri symptoms last weekend in June 2025 and not better p rx with pred  Chief Complaint  Patient presents with   Shortness of Breath    Wheezing, s.o.b- x1 week. Pt states she can not walk or exert herself without being out of breath. The wheezing comes from both the s.o.b and cough.    Cough    Both wet and dry cough, x6 months or longer. Pt states Dr Neysa gave her cough syrup before but the cough will not go away. Mucous is a dull yellow color.    Dyspnea:  room to room at home, ok at rest  Cough: dry sporadic with assoc wheeze x months, turned brown x one week  Sleep: difficult due to cough / wheeze  SABA use: up to 10 x daily  02 ldz:wnwz     No obvious day to day or daytime pattern/variability or assoc   mucus plugs or hemoptysis or cp or chest tightness,   or overt  hb symptoms.    Also denies any obvious fluctuation of symptoms with weather or environmental changes or other aggravating or alleviating factors except as outlined above   No unusual exposure hx or h/o childhood pna/ asthma or knowledge of premature birth.  Current Allergies, Complete Past Medical History, Past Surgical History, Family History, and Social History were reviewed in [employer] record.  ROS  The following are not active complaints unless bolded Hoarseness, sore throat, dysphagia, dental problems, itching, sneezing,  nasal congestion or discharge of excess mucus or purulent secretions, ear ache,   fever, chills, sweats, unintended wt loss or wt gain, classically pleuritic or exertional cp,  orthopnea pnd or arm/hand swelling  or leg swelling, presyncope, palpitations,  abdominal pain, anorexia, nausea, vomiting, diarrhea  or change in bowel habits or change in bladder habits, change in stools or change in urine, dysuria, hematuria,  rash, arthralgias, visual complaints, headache, numbness, weakness or ataxia or problems with walking or coordination,  change in mood or  memory.             Outpatient Medications Prior to Visit  Medication Sig Dispense Refill   albuterol  (PROAIR  HFA) 108 (90 BASE) MCG/ACT inhaler Inhale 2 puffs into the lungs every 6 (six) hours as needed for wheezing or shortness of breath. 3 Inhaler 1   fluticasone  (FLONASE ) 50 MCG/ACT nasal spray Place 2 sprays into both nostrils daily. 16 g 2   Fluticasone -Umeclidin-Vilant (TRELEGY ELLIPTA ) 100-62.5-25 MCG/ACT AEPB Inhale 1 puff into the lungs daily. 28 each 5   guaiFENesin  (MUCINEX ) 600 MG 12 hr tablet Take 1 tablet (600 mg total) by mouth 2 (two) times daily.     Multiple Vitamin (MULTIVITAMIN) tablet Take 1 tablet by mouth daily.     predniSONE  (DELTASONE ) 10 MG tablet 4 X 2 DAYS, 3 X 2 DAYS, 2 X 2 DAYS, 1 X 2 DAYS 20 tablet 0   promethazine -dextromethorphan (PROMETHAZINE -DM) 6.25-15 MG/5ML syrup 5 ml ever 8 hours if needed for cough 240 mL 12   Fluticasone -Umeclidin-Vilant (TRELEGY ELLIPTA ) 100-62.5-25 MCG/ACT AEPB Inhale 1 puff into  the lungs daily. (Patient not taking: Reported on 11/14/2023)     ipratropium-albuterol  (DUONEB) 0.5-2.5 (3) MG/3ML SOLN Inhale 1 neb every 6 hours as needed (Patient not taking: Reported on 11/14/2023) 75 mL 12   montelukast  (SINGULAIR ) 10 MG tablet TAKE 1 TABLET BY MOUTH EVERYDAY AT BEDTIME (Patient not taking: Reported on 11/14/2023) 90 tablet 1   No facility-administered medications prior to visit.    Past Medical History:  Diagnosis Date   Asthma    COPD (chronic obstructive pulmonary disease) (HCC)       Objective:     BP 126/70 (BP Location: Left Arm, Patient Position: Sitting, Cuff Size: Normal)   Pulse (!) 56   Temp 97.9 F (36.6 C) (Oral)    Ht 5' 1 (1.549 m)   Wt 209 lb (94.8 kg)   LMP 02/22/2011   SpO2 99% Comment: RA  BMI 39.49 kg/m   SpO2: 99 % (RA)  Amb mod obese (by BMI) bf nad at rest    HEENT : Oropharynx  clear      Nasal turbinates mod edema    NECK :  without  apparent JVD/ palpable Nodes/TM    LUNGS: no acc muscle use,  Nl contour chest with distant wheeze bilaterally with cough on  exp maneuvers   CV:  RRR  no s3 or murmur or increase in P2, and no edema   ABD:  obese soft and nontender   MS:  Gait nl   ext warm without deformities Or obvious joint restrictions  calf tenderness, cyanosis or clubbing    SKIN: warm and dry without lesions    NEURO:  alert, approp, nl sensorium with  no motor or cerebellar deficits apparent.       Assessment   Asthma-COPD overlap syndrome (HCC) Allergy  Profile 12/05/2013-total IgE 101.1 with elevations for dust mite and [place] grass pollen Office Spirometry-08/02/2015-severe obstructive airways disease.(Done after Xopenex  nebulizer treatment) FVC 2.58/102%, FEV1 1.00/47%, FEV1/FVC 0.39, 25-75 0.41/14% Office Spirometry 07/04/16- moderate obstructive airways disease-FVC 2.59/100%, FEV1 1.31/63%, ratio 0.51, FEF 25-75% 0.69/30% - 11/14/2023  After extensive coaching inhaler device,  effectiveness =    75% from a baseline of 50% > change to Breztri  and more approp saba rx   DDX of  difficult airways management almost all start with A and  include Adherence, Ace Inhibitors, Acid Reflux, Active Sinus Disease, Alpha 1 Antitripsin deficiency, Anxiety masquerading as Airways dz,  ABPA,  Allergy (esp in young), Aspiration (esp in elderly), Adverse effects of meds,  Active smoking or vaping, A bunch of PE's (a small clot burden can't cause this syndrome unless there is already severe underlying pulm or vascular dz with poor reserve) plus two Bs  = Bronchiectasis and Beta blocker use..and one C= CHF   Bolded of greatest concern:  Adherence is always the initial prime suspect  and is a multilayered concern that requires a trust but verify approach in every patient - starting with knowing how to use medications, especially inhalers, correctly, keeping up with refills and understanding the fundamental difference between maintenance and prns vs those medications only taken for a very short course and then stopped and not refilled.  - see hfa teaching - Re SABA :  I spent extra time with pt today reviewing appropriate use of albuterol  for prn use on exertion with the following points: 1) saba is for relief of sob that does not improve by walking a slower pace or resting but rather if the pt does not improve after  trying this first. 2) If the pt is convinced, as many are, that saba helps recover from activity faster then it's easy to tell if this is the case by re-challenging : ie stop, take the inhaler, then p 5 minutes try the exact same activity (intensity of workload) that just caused the symptoms and see if they are substantially diminished or not after saba 3) if there is an activity that reproducibly causes the symptoms, try the saba 15 min before the activity on alternate days  If in fact the saba really does help, then fine to continue to use it prn but advised may need to look closer at the maintenance regimen being used to achieve better control of airways disease with exertion.  - return with all meds in hand using a trust but verify approach to confirm accurate Medication  Reconciliation The principal here is that until we are certain that the  patients are doing what we've asked, it makes no sense to ask them to do more.   ? Acid (or non-acid) GERD > always difficult to exclude as up to 75% of pts in some series report no assoc GI/ Heartburn symptoms> rec max (24h)  acid suppression and diet restrictions/ reviewed and instructions given in writing.   ? Active sinus dz with brown mucus production > omnnicef x 10 d  ? Adverse drug effects > trelegy may be adding to  acute on chronic cough cc > try hfa equivalent = Breztri , coupons given.   F/u as per Dr Neysa as planned   Each maintenance medication was reviewed in detail including emphasizing most importantly the difference between maintenance and prns and under what circumstances the prns are to be triggered using an action plan format where appropriate.  Total time for H and P, chart review, counseling, reviewing hfa device(s) and generating customized AVS unique to this office visit / same day charting = 31 min with pt new to me          Ozell America, MD 11/14/2023

## 2023-11-14 NOTE — Telephone Encounter (Signed)
 Ok to work in with me or APP, next available

## 2023-11-14 NOTE — Telephone Encounter (Signed)
 Pt has ov 7/24. Nfn

## 2023-11-14 NOTE — Patient Instructions (Addendum)
 Take  Prednisone  10 m x 4 for three days 3 for three days 2 for three days 1 for three days and stop   Omncef 300 mg   twice daily x 10 days     Pantoprazole  (protonix ) 40 mg   Take  30-60 min before first meal of the day and Pepcid  (famotidine )  20 mg after supper until return to office - this is the best way to tell whether stomach acid is contributing to your problem.     Plan A = Automatic = Always=    Breztri  Take 2 puffs first thing in am and then another 2 puffs about 12 hours later.    Work on inhaler technique:  relax and gently blow all the way out then take a nice smooth full deep breath back in, triggering the inhaler at same time you start breathing in.  Hold breath in for at least  5 seconds if you can. Blow out breztri   thru nose. Rinse and gargle with water when done.  If mouth or throat bother you at all,  try brushing teeth/gums/tongue with arm and hammer toothpaste/ make a slurry and gargle and spit out.       Plan B = Backup (to supplement plan A, not to replace it) Only use your albuterol  inhaler as a rescue medication to be used if you can't catch your breath by resting or doing a relaxed purse lip breathing pattern.  - The less you use it, the better it will work when you need it. - Ok to use the inhaler up to 2 puffs  every 4 hours if you must but call for appointment if use goes up over your usual need - Don't leave home without it !!  (think of it like the spare tire for your car)   Plan C = Crisis (instead of Plan B but only if Plan B stops working) - only use your albuterol  nebulizer if you first try Plan B and it fails to help > ok to use the nebulizer up to every 4 hours but if start needing it regularly call for immediate appointment   Keep your previous appt

## 2023-11-14 NOTE — Telephone Encounter (Signed)
 FYI Only or Action Required?: Action required by provider: medication refill request and clinical question for provider.  Patient is followed in Pulmonology for Asthma-COPD, last seen on 06/01/2023 by Neysa Reggy BIRCH, MD.  Called Nurse Triage reporting Shortness of Breath (/) and Cough.  Symptoms began several months ago.  Interventions attempted: Nebulizer treatments.  Symptoms are: gradually worsening.  Triage Disposition: See HCP Within 4 Hours (Or PCP Triage)  Patient/caregiver understands and will follow disposition?: Yes  Describe your breathing? (e.g., wheezing, shortness of breath, unable to speak, severe coughing)  Dyspnea with exertion  2. ONSET: When did this breathing problem begin?  A week ago  3. PATTERN Does the difficult breathing come and go, or has it been constant since it started?  Intermittent  4. SEVERITY: How bad is your breathing? (e.g., mild, moderate, severe)  - MILD: No SOB at rest, mild SOB with walking, speaks normally in sentences, can lie down, no retractions, pulse < 100.  - MODERATE: SOB at rest, SOB with minimal exertion and prefers to sit, cannot lie down flat, speaks in phrases, mild retractions, audible wheezing, pulse 100 to 120.  - SEVERE: Very SOB at rest, speaks in single words, struggling to breathe, sitting hunched forward, retractions, pulse > 120  Mild  5. RECURRENT SYMPTOM: Have you had difficulty breathing before? If Yes, ask: When was the last time? and What happened that time?  Yes  6. CARDIAC HISTORY: Do you have any history of heart disease? (e.g., heart attack, angina, bypass surgery, angioplasty)  No  7. LUNG HISTORY: Do you have any history of lung disease? (e.g., pulmonary embolus, asthma, emphysema) COPD  8. CAUSE: What do you think is causing the breathing problem?  Unsure, believes it was Grandson's infection  9. OTHER SYMPTOMS: Do you have any other symptoms? (e.g., chest pain, cough,  dizziness, fever, runny nose) Productive Cough (Yellow)  10. O2 SATURATION MONITOR: Do you use an oxygen saturation monitor (pulse oximeter) at home? If Yes, ask: What is your reading (oxygen level) today? What is your usual oxygen saturation reading? (e.g., 95%) Unsure of current level, Averages 97%  11. PREGNANCY: Is there any chance you are pregnant? When was your last menstrual period? No and No  12. TRAVEL: Have you traveled out of the country in the last month? (e.g., travel history, exposures)  [place] was sick  Copied from CRM [id number]. Topic: Clinical - Red Word Triage >> Nov 14, 2023  1:41 PM Isabell A wrote: [facility] that prompted transfer to Nurse Triage: Experiencing SOB & a bad cough, patient is requesting prednisone .  Reason for Disposition  Continuous (nonstop) coughing interferes with work, school, or sleeping  Protocols used: Breathing Difficulty-A-AH

## 2023-11-14 NOTE — Telephone Encounter (Signed)
Please advise Dr. Annamaria Boots.

## 2023-11-15 NOTE — Assessment & Plan Note (Signed)
 Allergy  Profile 12/05/2013-total IgE 101.1 with elevations for dust mite and [place] grass pollen Office Spirometry-08/02/2015-severe obstructive airways disease.(Done after Xopenex  nebulizer treatment) FVC 2.58/102%, FEV1 1.00/47%, FEV1/FVC 0.39, 25-75 0.41/14% Office Spirometry 07/04/16- moderate obstructive airways disease-FVC 2.59/100%, FEV1 1.31/63%, ratio 0.51, FEF 25-75% 0.69/30% - 11/14/2023  After extensive coaching inhaler device,  effectiveness =    75% from a baseline of 50% > change to Breztri  and more approp saba rx   DDX of  difficult airways management almost all start with A and  include Adherence, Ace Inhibitors, Acid Reflux, Active Sinus Disease, Alpha 1 Antitripsin deficiency, Anxiety masquerading as Airways dz,  ABPA,  Allergy (esp in young), Aspiration (esp in elderly), Adverse effects of meds,  Active smoking or vaping, A bunch of PE's (a small clot burden can't cause this syndrome unless there is already severe underlying pulm or vascular dz with poor reserve) plus two Bs  = Bronchiectasis and Beta blocker use..and one C= CHF   Bolded of greatest concern:  Adherence is always the initial prime suspect and is a multilayered concern that requires a trust but verify approach in every patient - starting with knowing how to use medications, especially inhalers, correctly, keeping up with refills and understanding the fundamental difference between maintenance and prns vs those medications only taken for a very short course and then stopped and not refilled.  - see hfa teaching - Re SABA :  I spent extra time with pt today reviewing appropriate use of albuterol  for prn use on exertion with the following points: 1) saba is for relief of sob that does not improve by walking a slower pace or resting but rather if the pt does not improve after trying this first. 2) If the pt is convinced, as many are, that saba helps recover from activity faster then it's easy to tell if this is the case  by re-challenging : ie stop, take the inhaler, then p 5 minutes try the exact same activity (intensity of workload) that just caused the symptoms and see if they are substantially diminished or not after saba 3) if there is an activity that reproducibly causes the symptoms, try the saba 15 min before the activity on alternate days  If in fact the saba really does help, then fine to continue to use it prn but advised may need to look closer at the maintenance regimen being used to achieve better control of airways disease with exertion.  - return with all meds in hand using a trust but verify approach to confirm accurate Medication  Reconciliation The principal here is that until we are certain that the  patients are doing what we've asked, it makes no sense to ask them to do more.   ? Acid (or non-acid) GERD > always difficult to exclude as up to 75% of pts in some series report no assoc GI/ Heartburn symptoms> rec max (24h)  acid suppression and diet restrictions/ reviewed and instructions given in writing.   ? Active sinus dz with brown mucus production > omnnicef x 10 d  ? Adverse drug effects > trelegy may be adding to acute on chronic cough cc > try hfa equivalent = Breztri , coupons given.   F/u as per Dr Neysa as planned   Each maintenance medication was reviewed in detail including emphasizing most importantly the difference between maintenance and prns and under what circumstances the prns are to be triggered using an action plan format where appropriate.  Total time for H and P,  chart review, counseling, reviewing hfa device(s) and generating customized AVS unique to this office visit / same day charting = 31 min with pt new to me

## 2023-11-17 ENCOUNTER — Ambulatory Visit: Payer: Self-pay | Admitting: Internal Medicine

## 2023-11-28 NOTE — Progress Notes (Unsigned)
 HPI female never smoker/+ secondhand, followed for COPD with asthma, probably chronic fixed asthma PFT 03/10/2014-severe obstructive airways disease, no response to BD, normal TLC, normal DLCO. FVC 2.22/84%, FEV1 0.95/44%, ratio 0.43 Office Spirometry-08/02/2015-severe obstructive airways disease.(Done after Xopenex  nebulizer treatment) FVC 2.58/102%, FEV1 1.00/47%, FEV1/FVC 0.39, 25-75 0.41/14% ECHO- 06/23/16- EF 65-70 percent a1AT- WNL 124 MM 07/10/12 Allergy  Profile 12/05/13-total IgE normal 101 with minimal elevations for dust mite and [place] pollen. CBC  normal eosinophils Office Spirometry 07/04/16- moderate obstructive airways disease-FVC 2.59/100%, FEV1 1.31/63%, ratio 0.51, FEF 25-75% 0.69/30% PFT 04/21/22-severe obstructive airways disease insignificant response to bronchodilator normal diffusion -------------------------------------------------------------------------------------------------------.  11/20/22- 58 year old female never smoker/+ secondhand, followed for Asthma/COPD Overlap, allergic rhinitis, Eczema, obesity  - singulair , ProAir  hfa, Neb Duoneb, flonase , Dupixent   Pulmonary Rehab-for severe COPD stage 3 -----Breathing has not been the best ACT 13 Insurance won't cover Trelegy or Dulera She has not been using a maintenance controller recently-discussed.  Dupixent  seems to be helpful and will be continued.  She is having more difficulty with her breathing and describes much cough with yellow sputum.  Finds it hard to lie down at first at bedtime each night and has to prop up.  Nebulizer helps some. We are going to try samples of Breztri  and if helpful she will check on insurance coverage for that.  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  Pt request to discontinue Dupixent . Dislikes pain of injection and doesn't feel it is helping her. CXR 04/11/23 IMPRESSION: No active cardiopulmonary disease. Discussed the use of AI scribe software for clinical note transcription with the patient, who gave verbal consent to proceed.  History of Present Illness   The patient, with a history of respiratory disease, presents with a persistent cough that has been ongoing for several months. The cough is both productive and dry, with the sputum varying from clear to dark yellow. She also reports experiencing night sweats, which may be related to the onset of menopause.  The patient has been on Dupixent , but wishes to discontinue due to lack of perceived benefit and discomfort from injections. She continues to use the Trelegy inhaler once daily and has found pulmonary rehabilitation helpful. She also uses a nebulizer machine once or twice a week.  In addition to her respiratory symptoms, the patient has been experiencing a nagging cough, which has not improved despite a course of doxycycline  in July. She has been managing the cough with promethazine  DM cough syrup.   Assessment and Plan:    Chronic Cough Persistent for months with both dry and productive episodes. Sputum sometimes clear, sometimes dark yellow. No current wheezing or rales on exam. Prior trial of doxycycline  without resolution. -Start Augmentin  to target potential infection. -Refill Promethazine  DM cough syrup.  Asthma Stable on current regimen. No acute flare-ups reported. Discontinuation of Dupixent  due to patient preference. -Continue Trelegy inhaler once daily. -Continue use of nebulizer 1-2 times weekly as needed.  General Health Maintenance / Followup Plans -Continue pulmonary rehabilitation as it has shown to be beneficial. -Follow-up after completion of Augmentin  course to assess response.    11/29/23- 58 year old female never smoker/+ secondhand, followed for  Asthma/COPD Overlap, allergic rhinitis, Eczema, obesity  - singulair , ProAir  hfa, Neb Duoneb, flonase , Dupixent , Trelegy 100,   Pulmonary Rehab-for severe COPD stage 3  11/14/23- acute visit Dr  Wert exacerbation- changed Trelegy to Breztri . CXR 11/16/23-  IMPRESSION: Mild bronchial thickening.     ROS-see HPI   + = positive Constitutional:   No-   weight loss, night sweats, fevers, chills, fatigue, lassitude. HEENT:   No-  headaches, difficulty swallowing, tooth/dental problems, +sore throat,       sneezing, itching, ear ache, +nasal congestion, post nasal drip,  CV:  No-   chest pain, orthopnea, PND, swelling in lower extremities, anasarca, dizziness, palpitations Resp: +  shortness of breath with exertion or at rest.               +productive cough, + non-productive cough,  No- coughing up of blood.              No-   change in color of mucus.  + wheezing.   Skin: No-   rash or lesions. GI:  No-   heartburn, indigestion, abdominal pain, nausea, vomiting,  GU: n. MS:  No-   joint pain or swelling.   Neuro-     nothing unusual Psych:  No- change in mood or affect. No depression or anxiety.  No memory loss.  OBJ- Physical Exam    General- Alert, Oriented, Affect-appropriate, Distress- none acute, + overweight Skin- rash-none, lesions- none, excoriation- none Lymphadenopathy- none Head- atraumatic            Eyes- Gross vision intact, PERRLA, conjunctivae and secretions clear            Ears- Hearing, canals-normal            Nose- Clear, no-Septal dev, mucus, polyps, erosion, perforation. + nasal crease             Throat- Mallampati III , mucosa-, drainage- none, tonsils- atrophic, own teeth Neck- flexible , trachea midline, no stridor , thyroid  nl, carotid no bruit Chest - symmetrical excursion , unlabored           Heart/CV- RRR , no murmur , no gallop  , no rub, nl s1 s2                           - JVD- none , edema- none, stasis changes- none, varices- none           Lung-   +minimal coarse sounds in bases, wheeze, cough-none, dullness-none, rub- none           Chest wall-  Abd-  Br/ Gen/ Rectal- Not done, not indicated Extrem- cyanosis- none, clubbing, none, atrophy- none, strength- nl Neuro- grossly intact to observation

## 2023-11-29 ENCOUNTER — Ambulatory Visit: Payer: Commercial Managed Care - HMO | Admitting: Internal Medicine

## 2023-11-29 ENCOUNTER — Encounter: Payer: Self-pay | Admitting: Internal Medicine

## 2023-11-29 VITALS — BP 108/62 | HR 89 | Temp 98.1°F | Ht 61.0 in | Wt 212.2 lb

## 2023-11-29 DIAGNOSIS — J4489 Other specified chronic obstructive pulmonary disease: Secondary | ICD-10-CM

## 2023-11-29 MED ORDER — OHTUVAYRE 3 MG/2.5ML IN SUSP: 3.0000 mg | Freq: Two times a day (BID) | RESPIRATORY_TRACT | Status: DC

## 2023-11-29 MED ORDER — PREDNISONE 10 MG PO TABS: 10.0000 mg | ORAL_TABLET | Freq: Every day | ORAL | 0 refills | Status: DC

## 2023-11-29 MED ORDER — DOXYCYCLINE HYCLATE 100 MG PO TABS: 100.0000 mg | ORAL_TABLET | Freq: Two times a day (BID) | ORAL | 0 refills | Status: DC

## 2023-11-29 MED ORDER — BREZTRI AEROSPHERE 160-9-4.8 MCG/ACT IN AERO: 2.0000 | INHALATION_SPRAY | Freq: Two times a day (BID) | RESPIRATORY_TRACT | Status: DC

## 2023-11-29 NOTE — Patient Instructions (Signed)
 Script sent for prednisone  to take 10 mg daily for 1 month, then stop  Script sent for doxycycline  antibiotic- take 1 twice daily  Order- starting application for Ohtuvayre  nebulizer solution- 1 neb twice daily.  Prior auth if necessary. You can also use your regular nebulizer medicine if needed as well, but try this Ohtuvayre  regularly for a while to see if it helps.

## 2023-11-29 NOTE — Addendum Note (Signed)
 Addended by: Cerenity Goshorn M on: 11/29/2023 03:09 PM   Modules accepted: Orders

## 2023-12-06 ENCOUNTER — Telehealth: Payer: Self-pay

## 2023-12-06 NOTE — Telephone Encounter (Signed)
 Copied from CRM [id number]. Topic: Clinical - Prescription Issue >> Dec 05, 2023  3:11 PM Corean SAUNDERS wrote: Reason for CRM: Patient states her pharmacy advised her that she needs a prior authorization for budesonide-glycopyrrolate -formoterol  (BREZTRI  AEROSPHERE) 160-9-4.8 MCG/ACT AERO inhaler  Patient is requesting Dr. Darlean to submit this prior authorization.

## 2023-12-10 ENCOUNTER — Other Ambulatory Visit (HOSPITAL_COMMUNITY): Payer: Self-pay

## 2023-12-10 ENCOUNTER — Telehealth: Payer: Self-pay

## 2023-12-10 NOTE — Telephone Encounter (Signed)
 Received Ohtuvayre new start paperwork. Completed form and faxed with clinicals and insurance card copy to [facility]   Phone#: [phone] Fax#: [phone]

## 2023-12-10 NOTE — Telephone Encounter (Signed)
 PA request has been Received. New Encounter has been or will be created for follow up. For additional info see Pharmacy Prior Auth telephone encounter from 08/04.

## 2023-12-10 NOTE — Telephone Encounter (Signed)
*  Pulm  Pharmacy Patient Advocate Encounter   Received notification from Pt Calls Messages that prior authorization for Breztri  is required/requested.   Insurance verification completed.   The patient is insured through [employer] .   Per test claim:  Trelegy Ellipta  is preferred by the insurance.  If suggested medication is appropriate, Please send in a new RX and discontinue this one. If not, please advise as to why it's not appropriate so that we may request a Prior Authorization. Please note, some preferred medications may still require a PA.  If the suggested medications have not been trialed and there are no contraindications to their use, the PA will not be submitted, as it will not be approved.   *per test claim- Trelegy shows as $636.09 due to patient having a deductible to meet.

## 2023-12-10 NOTE — Telephone Encounter (Signed)
 Breztri  is not covered.  Trelegy is preferred but shows as $636.09 due to patient having a deductible to meet.   Called patient.  Patient has Acupuncturist and [place]. Patient states she was given a coupon for Breztri  for Zero Copay.  Patient will take this to the pharmacy to try to get the Breztri  for free or low copay. Patient will call back with update.

## 2023-12-12 ENCOUNTER — Telehealth: Payer: Self-pay

## 2023-12-12 NOTE — Telephone Encounter (Unsigned)
 Copied from CRM [id number]. Topic: Clinical - Prescription Issue >> Dec 11, 2023 11:04 AM Jocelyn Myers wrote: Reason for CRM: Pt stated her insurance will not cover Breztri , and Trelegy is too expensive at $636.09 per month. Pt is requesting to see if there's any alternative to the two medications for a reasonable price for the prescription. Pt's phone number is [phone] ok to leave a vm.    Spoke with patient need info broken down more    -NFN

## 2023-12-13 ENCOUNTER — Telehealth: Payer: Self-pay | Admitting: Pharmacy Technician

## 2023-12-13 ENCOUNTER — Other Ambulatory Visit (HOSPITAL_COMMUNITY): Payer: Self-pay

## 2023-12-13 NOTE — Telephone Encounter (Signed)
 Received fax from [employer] with summary of benefits. Referral form for Ohtuvayre  received. Rx will be triaged to [facility].. Once benefits investigation completed, pharmacy will reach out the patient to schedule shipment. If medication is unaffordable, patient will need to express financial hardship to be referred back to [place] [facility] for patient assistance program pre-screening.   Patient ID: [id number] Pharmacy phone: [phone] [place] [facility] Phone#: [phone]

## 2023-12-13 NOTE — Telephone Encounter (Signed)
 Received fax from [employer] confirming receipt of Ohtuvayre  enrollment form  Patient ID: [id number]

## 2023-12-13 NOTE — Telephone Encounter (Signed)
 Any chance can you please speak w/ patient and break it down to her

## 2023-12-13 NOTE — Telephone Encounter (Signed)
 Pharmacy Patient Advocate Encounter   Per msg in PT calls msgs: Reason for CRM: Pt stated her insurance will not cover Breztri , and Trelegy is too expensive at $636.09 per month. Pt is requesting to see if there's any alternative to the two medications for a reasonable price for the prescription. Pt's phone number is [phone] ok to leave a vm.   Test claimed generic Advair and it was less than $20. Spiriva, Tudorza, Breztri  not covered. Incruse elipta is over $300, Anoro is over $400, almost $460.  Msg office back to find out what other medications they'd prefer the pt take before test claiming more.

## 2023-12-14 NOTE — Telephone Encounter (Signed)
 Submitted a Prior Authorization request to [employer] for OHTUVAYRE  via CoverMyMeds. Will update once we receive a response.  Key: [id number]

## 2023-12-14 NOTE — Telephone Encounter (Signed)
 Received notification from [employer] regarding a prior authorization for OHTUVAYRE . Authorization has been APPROVED from 12/14/2023 to 12/13/2024.   Authorization # [id number]  Sherry Pennant, PharmD, MPH, BCPS, CPP Clinical Pharmacist (Rheumatology and Pulmonology)

## 2023-12-19 ENCOUNTER — Encounter: Admitting: Nurse Practitioner

## 2023-12-26 ENCOUNTER — Other Ambulatory Visit: Payer: Self-pay | Admitting: Internal Medicine

## 2023-12-27 NOTE — Telephone Encounter (Signed)
 Prednisone refilled

## 2023-12-27 NOTE — Telephone Encounter (Signed)
 Please advise if okay to continue taking,per last AVS patient is supposed to take for 1 month then stop

## 2024-01-28 NOTE — Progress Notes (Unsigned)
 HPI female never smoker/+ secondhand, followed for COPD with asthma, probably chronic fixed asthma PFT 03/10/2014-severe obstructive airways disease, no response to BD, normal TLC, normal DLCO. FVC 2.22/84%, FEV1 0.95/44%, ratio 0.43 Office Spirometry-08/02/2015-severe obstructive airways disease.(Done after Xopenex  nebulizer treatment) FVC 2.58/102%, FEV1 1.00/47%, FEV1/FVC 0.39, 25-75 0.41/14% ECHO- 06/23/16- EF 65-70 percent a1AT- WNL 124 MM 07/10/12 Allergy  Profile 12/05/13-total IgE normal 101 with minimal elevations for dust mite and [place] pollen. CBC  normal eosinophils Office Spirometry 07/04/16- moderate obstructive airways disease-FVC 2.59/100%, FEV1 1.31/63%, ratio 0.51, FEF 25-75% 0.69/30% PFT 04/21/22-severe obstructive airways disease insignificant response to bronchodilator normal diffusion -------------------------------------------------------------------------------------------------------.    11/29/23- 58 year old female never smoker/+ secondhand, followed for Asthma/COPD Overlap, allergic rhinitis, Eczema, obesity  - singulair , ProAir  hfa, Neb Duoneb, flonase , Dupixent , Trelegy 100,   Pulmonary Rehab-for severe COPD stage 3  11/14/23- acute visit Dr Darlean exacerbation- changed Trelegy to Breztri . CXR 11/16/23-  IMPRESSION: Mild bronchial thickening.  -----Some improvement.  Still c/o cough, wheeze and SOB.  ACT score-11 Discussed the use of AI scribe software for clinical note transcription with the patient, who gave verbal consent to proceed.  History of Present Illness   Jocelyn Myers is a 58 year old female with chronic respiratory issues who presents with persistent wheezing and cough.  She experiences persistent wheezing and coughing, which have not improved despite recent treatments. Lightheadedness and dizziness worsen after stopping prednisone , which initially provided relief. She finds Breztri  more effective than Trelegy. After completing a course of Omnicef  and a  prednisone  taper, symptoms returned post-prednisone . Currently, she is not on antibiotics and only takes stomach acid pills. She coughs up yellowish mucus. No fever or sore throat. No sinus infection symptoms, headache, or nasal pressure, though she has a runny nose. Her home environment has new floors, and she denies mold or dampness issues. Her husband keeps the air conditioning very cold, which may contribute to her symptoms. Her oxygen levels at home are around 98-99%. We discussed options, including low dose maintenance prednisone , antibiotic, Ohtuvaure. She may be willing to try a different Biologic if necessary.     Assessment and Plan:    Asthma/ COPD overlap Chronic obstructive pulmonary disease (COPD) with acute exacerbation COPD exacerbation with wheezing, cough, and yellow sputum post-prednisone  cessation. Oxygen saturation stable. Yellow sputum suggests possible bacterial involvement. Breztri  preferred over Trelegy. - Prescribed prednisone  10 mg daily for maintenance. - Prescribed doxycycline  as new antibiotic course. - Initiated prior authorization for Ohtuvayre  nebulizer treatment.  Bronchitis Mild bronchitis on recent chest x-ray, cough and yellow sputum present. Completed Omnicef  course. - Try doxycycline     01/29/24- 58 year old female never smoker/+ secondhand, followed for Asthma/COPD Overlap, allergic rhinitis, Eczema, obesity  - singulair , ProAir  hfa, Neb Duoneb, flonase , Breztri ,   Pulmonary Rehab-for severe COPD stage 3  She quit Dupixent - not helpful. Pharmacy team worked with her- Advair affordable. Ohtuvayre , Spiriva not available. Discussed the use of AI scribe software for clinical note transcription with the patient, who gave verbal consent to proceed.  History of Present Illness   Jocelyn Myers is a 58 year old female with Asthma/ chronic obstructive pulmonary disease who presents with medication management issues.  She faces challenges with her inhaler  medications due to insurance not covering Breztri , Ohtuvayre  nebulizer solution, or Trelegy, which are financially burdensome. We discussed Advair as a more affordable option.  She has completed physical therapy and pulmonary rehabilitation. She finished a course of prednisone  and is not using oxygen therapy. She has DuoNeb every  six hours as needed. We touched on potential for lung transplant in the future.     Assessment and Plan:    Asthma/ Chronic obstructive pulmonary disease (COPD) COPD management complicated by inhaler cost. Advair considered as alternative. Lung transplant not indicated now, but will be a future consideration. Oxygen therapy not needed currently. Completed pulmonary rehab and prednisone . - Prescribe Advair inhaler for maintenance therapy. - Prescribe DuoNeb (ipratropium and albuterol ) for nebulizer use every six hours as needed. - Discuss potential future lung transplant evaluation at [facility] or [facility] if condition worsens.  Influenza immunization Influenza vaccination recommended for seasonal flu protection. - Administer flu shot.     ROS-see HPI   + = positive Constitutional:   No-   weight loss, night sweats, fevers, chills, fatigue, lassitude. HEENT:   No-  headaches, difficulty swallowing, tooth/dental problems, +sore throat,       sneezing, itching, ear ache, +nasal congestion, post nasal drip,  CV:  No-   chest pain, orthopnea, PND, swelling in lower extremities, anasarca, dizziness, palpitations Resp: +  shortness of breath with exertion or at rest.               +productive cough, + non-productive cough,  No- coughing up of blood.              No-   change in color of mucus.  + wheezing.   Skin: No-   rash or lesions. GI:  No-   heartburn, indigestion, abdominal pain, nausea, vomiting,  GU: n. MS:  No-   joint pain or swelling.   Neuro-     nothing unusual Psych:  No- change in mood or affect. No depression or anxiety.  No memory loss.  OBJ- Physical Exam     General- Alert, Oriented, Affect-appropriate, Distress- none acute, + overweight Skin- rash-none, lesions- none, excoriation- none Lymphadenopathy- none Head- atraumatic            Eyes- Gross vision intact, PERRLA, conjunctivae and secretions clear            Ears- Hearing, canals-normal            Nose- Clear, no-Septal dev, mucus, polyps, erosion, perforation. + nasal crease             Throat- Mallampati III , mucosa-, drainage- none, tonsils- atrophic, own teeth Neck- flexible , trachea midline, no stridor , thyroid  nl, carotid no bruit Chest - symmetrical excursion , unlabored           Heart/CV- RRR , no murmur , no gallop  , no rub, nl s1 s2                           - JVD- none , edema- none, stasis changes- none, varices- none           Lung-  +minimal coarse sounds in bases, unlabored, wheeze, cough-none, dullness-none, rub- none           Chest wall-  Abd-  Br/ Gen/ Rectal- Not done, not indicated Extrem- cyanosis- none, clubbing, none, atrophy- none, strength- nl Neuro- grossly intact to observation

## 2024-01-29 ENCOUNTER — Encounter: Payer: Self-pay | Admitting: Internal Medicine

## 2024-01-29 ENCOUNTER — Ambulatory Visit: Admitting: Internal Medicine

## 2024-01-29 VITALS — BP 104/68 | HR 77 | Temp 98.0°F | Ht 61.0 in | Wt 208.0 lb

## 2024-01-29 DIAGNOSIS — J449 Chronic obstructive pulmonary disease, unspecified: Secondary | ICD-10-CM

## 2024-01-29 DIAGNOSIS — J45909 Unspecified asthma, uncomplicated: Secondary | ICD-10-CM | POA: Diagnosis not present

## 2024-01-29 DIAGNOSIS — Z23 Encounter for immunization: Secondary | ICD-10-CM

## 2024-01-29 MED ORDER — IPRATROPIUM-ALBUTEROL 0.5-2.5 (3) MG/3ML IN SOLN: 3.0000 mL | Freq: Four times a day (QID) | RESPIRATORY_TRACT | 12 refills | Status: AC | PRN

## 2024-01-29 MED ORDER — FLUTICASONE-SALMETEROL 250-50 MCG/ACT IN AEPB: INHALATION_SPRAY | RESPIRATORY_TRACT | 11 refills | Status: AC

## 2024-01-29 NOTE — Patient Instructions (Addendum)
 Order- flu vax standard  Script sent for Advair inhaler to be your maintenance inhaler- inhale 1 puff then rinse mouth,twice daily  Script sent for albuterol  nebulizer solution- changing to DuoNeb- may use 1 ampule every 6 hours, as needed.

## 2024-01-30 ENCOUNTER — Encounter: Payer: Self-pay | Admitting: Internal Medicine

## 2024-01-31 ENCOUNTER — Ambulatory Visit: Admitting: Nurse Practitioner

## 2024-04-15 ENCOUNTER — Ambulatory Visit: Admitting: Nurse Practitioner

## 2024-05-06 ENCOUNTER — Other Ambulatory Visit: Payer: Self-pay | Admitting: Internal Medicine

## 2024-05-06 DIAGNOSIS — J4489 Other specified chronic obstructive pulmonary disease: Secondary | ICD-10-CM

## 2024-05-13 ENCOUNTER — Encounter: Admitting: Internal Medicine

## 2024-06-02 ENCOUNTER — Other Ambulatory Visit: Payer: Self-pay

## 2024-06-17 ENCOUNTER — Encounter: Admitting: Internal Medicine
